# Patient Record
Sex: Female | Born: 1960
Health system: Southern US, Community
[De-identification: ages and names within clinical notes are randomized; demographics above are authoritative.]

## PROBLEM LIST (undated history)

## (undated) ENCOUNTER — Emergency Department (HOSPITAL_COMMUNITY): Admission: EM | Discharge status: Against Medical Advice | Payer: Self-pay

## (undated) DIAGNOSIS — N390 Urinary tract infection, site not specified: Secondary | ICD-10-CM

## (undated) DIAGNOSIS — F32A Depression, unspecified: Secondary | ICD-10-CM

## (undated) DIAGNOSIS — I10 Essential (primary) hypertension: Secondary | ICD-10-CM

## (undated) DIAGNOSIS — B20 Human immunodeficiency virus [HIV] disease: Secondary | ICD-10-CM

## (undated) DIAGNOSIS — F329 Major depressive disorder, single episode, unspecified: Secondary | ICD-10-CM

## (undated) DIAGNOSIS — Z21 Asymptomatic human immunodeficiency virus [HIV] infection status: Secondary | ICD-10-CM

## (undated) DIAGNOSIS — A150 Tuberculosis of lung: Secondary | ICD-10-CM

## (undated) DIAGNOSIS — F319 Bipolar disorder, unspecified: Secondary | ICD-10-CM

## (undated) DIAGNOSIS — K219 Gastro-esophageal reflux disease without esophagitis: Secondary | ICD-10-CM

## (undated) HISTORY — PX: ABDOMINAL HYSTERECTOMY: SHX81

---

## 1986-11-22 ENCOUNTER — Encounter (INDEPENDENT_AMBULATORY_CARE_PROVIDER_SITE_OTHER): Payer: Self-pay | Admitting: *Deleted

## 1986-11-22 LAB — CONVERTED CEMR LAB
CD4 Count: 1200 microliters
CD4 T Cell Abs: 1200

## 1987-01-22 DIAGNOSIS — A150 Tuberculosis of lung: Secondary | ICD-10-CM

## 1987-01-22 HISTORY — DX: Tuberculosis of lung: A15.0

## 1995-01-22 DIAGNOSIS — N390 Urinary tract infection, site not specified: Secondary | ICD-10-CM

## 1995-01-22 HISTORY — DX: Urinary tract infection, site not specified: N39.0

## 2002-07-27 ENCOUNTER — Encounter: Payer: Self-pay | Admitting: Internal Medicine

## 2002-07-27 ENCOUNTER — Ambulatory Visit (HOSPITAL_COMMUNITY): Admission: RE | Admit: 2002-07-27 | Discharge: 2002-07-27 | Payer: Self-pay | Admitting: Internal Medicine

## 2002-07-27 ENCOUNTER — Encounter: Admission: RE | Admit: 2002-07-27 | Discharge: 2002-07-27 | Payer: Self-pay | Admitting: Internal Medicine

## 2002-08-17 ENCOUNTER — Encounter: Payer: Self-pay | Admitting: Internal Medicine

## 2002-08-17 ENCOUNTER — Encounter: Admission: RE | Admit: 2002-08-17 | Discharge: 2002-08-17 | Payer: Self-pay | Admitting: Internal Medicine

## 2002-08-17 ENCOUNTER — Ambulatory Visit (HOSPITAL_COMMUNITY): Admission: RE | Admit: 2002-08-17 | Discharge: 2002-08-17 | Payer: Self-pay | Admitting: Internal Medicine

## 2002-10-09 ENCOUNTER — Emergency Department (HOSPITAL_COMMUNITY): Admission: EM | Admit: 2002-10-09 | Discharge: 2002-10-09 | Payer: Self-pay | Admitting: Emergency Medicine

## 2002-11-02 ENCOUNTER — Ambulatory Visit (HOSPITAL_COMMUNITY): Admission: RE | Admit: 2002-11-02 | Discharge: 2002-11-02 | Payer: Self-pay | Admitting: Internal Medicine

## 2002-11-02 ENCOUNTER — Encounter: Payer: Self-pay | Admitting: Internal Medicine

## 2002-11-02 ENCOUNTER — Encounter: Admission: RE | Admit: 2002-11-02 | Discharge: 2002-11-02 | Payer: Self-pay | Admitting: Internal Medicine

## 2002-11-16 ENCOUNTER — Encounter: Admission: RE | Admit: 2002-11-16 | Discharge: 2002-11-16 | Payer: Self-pay | Admitting: Internal Medicine

## 2003-02-01 ENCOUNTER — Encounter: Admission: RE | Admit: 2003-02-01 | Discharge: 2003-02-01 | Payer: Self-pay | Admitting: Internal Medicine

## 2003-02-15 ENCOUNTER — Encounter: Admission: RE | Admit: 2003-02-15 | Discharge: 2003-02-15 | Payer: Self-pay | Admitting: Internal Medicine

## 2003-05-11 ENCOUNTER — Encounter: Admission: RE | Admit: 2003-05-11 | Discharge: 2003-05-11 | Payer: Self-pay | Admitting: Internal Medicine

## 2003-05-11 ENCOUNTER — Ambulatory Visit (HOSPITAL_COMMUNITY): Admission: RE | Admit: 2003-05-11 | Discharge: 2003-05-11 | Payer: Self-pay | Admitting: Internal Medicine

## 2003-05-17 ENCOUNTER — Encounter: Admission: RE | Admit: 2003-05-17 | Discharge: 2003-05-17 | Payer: Self-pay | Admitting: Internal Medicine

## 2003-05-19 ENCOUNTER — Encounter: Admission: RE | Admit: 2003-05-19 | Discharge: 2003-05-19 | Payer: Self-pay | Admitting: Obstetrics and Gynecology

## 2003-05-24 ENCOUNTER — Encounter: Admission: RE | Admit: 2003-05-24 | Discharge: 2003-05-24 | Payer: Self-pay | Admitting: Internal Medicine

## 2003-05-31 ENCOUNTER — Encounter: Admission: RE | Admit: 2003-05-31 | Discharge: 2003-05-31 | Payer: Self-pay | Admitting: Internal Medicine

## 2003-08-08 ENCOUNTER — Encounter: Admission: RE | Admit: 2003-08-08 | Discharge: 2003-08-08 | Payer: Self-pay | Admitting: Obstetrics and Gynecology

## 2003-10-20 ENCOUNTER — Emergency Department (HOSPITAL_COMMUNITY): Admission: EM | Admit: 2003-10-20 | Discharge: 2003-10-20 | Payer: Self-pay | Admitting: Emergency Medicine

## 2003-10-24 ENCOUNTER — Ambulatory Visit: Payer: Self-pay | Admitting: Obstetrics and Gynecology

## 2003-10-24 ENCOUNTER — Emergency Department (HOSPITAL_COMMUNITY): Admission: EM | Admit: 2003-10-24 | Discharge: 2003-10-24 | Payer: Self-pay | Admitting: Family Medicine

## 2003-10-25 ENCOUNTER — Ambulatory Visit: Payer: Self-pay | Admitting: Obstetrics and Gynecology

## 2003-10-27 ENCOUNTER — Ambulatory Visit (HOSPITAL_COMMUNITY): Admission: RE | Admit: 2003-10-27 | Discharge: 2003-10-27 | Payer: Self-pay | Admitting: Infectious Diseases

## 2003-10-27 ENCOUNTER — Ambulatory Visit: Payer: Self-pay | Admitting: Infectious Diseases

## 2003-10-28 ENCOUNTER — Ambulatory Visit (HOSPITAL_COMMUNITY): Admission: RE | Admit: 2003-10-28 | Discharge: 2003-10-28 | Payer: Self-pay | Admitting: Infectious Diseases

## 2003-11-10 ENCOUNTER — Ambulatory Visit: Payer: Self-pay | Admitting: Infectious Diseases

## 2003-11-16 ENCOUNTER — Ambulatory Visit (HOSPITAL_COMMUNITY): Admission: RE | Admit: 2003-11-16 | Discharge: 2003-11-16 | Payer: Self-pay | Admitting: Obstetrics and Gynecology

## 2003-11-16 ENCOUNTER — Ambulatory Visit: Payer: Self-pay | Admitting: Obstetrics and Gynecology

## 2003-11-16 ENCOUNTER — Encounter (INDEPENDENT_AMBULATORY_CARE_PROVIDER_SITE_OTHER): Payer: Self-pay | Admitting: Specialist

## 2003-11-22 ENCOUNTER — Ambulatory Visit: Payer: Self-pay | Admitting: Internal Medicine

## 2004-01-10 ENCOUNTER — Ambulatory Visit: Payer: Self-pay | Admitting: Obstetrics and Gynecology

## 2004-01-25 ENCOUNTER — Ambulatory Visit: Payer: Self-pay | Admitting: Internal Medicine

## 2004-01-25 ENCOUNTER — Ambulatory Visit (HOSPITAL_COMMUNITY): Admission: RE | Admit: 2004-01-25 | Discharge: 2004-01-25 | Payer: Self-pay | Admitting: Internal Medicine

## 2004-02-13 ENCOUNTER — Ambulatory Visit (HOSPITAL_COMMUNITY): Admission: RE | Admit: 2004-02-13 | Discharge: 2004-02-13 | Payer: Self-pay | Admitting: Obstetrics and Gynecology

## 2004-02-13 ENCOUNTER — Ambulatory Visit: Payer: Self-pay | Admitting: Obstetrics and Gynecology

## 2004-02-14 ENCOUNTER — Ambulatory Visit: Payer: Self-pay | Admitting: Internal Medicine

## 2004-02-28 ENCOUNTER — Ambulatory Visit: Payer: Self-pay | Admitting: Obstetrics and Gynecology

## 2004-04-24 ENCOUNTER — Ambulatory Visit: Payer: Self-pay | Admitting: Internal Medicine

## 2004-04-24 ENCOUNTER — Ambulatory Visit (HOSPITAL_COMMUNITY): Admission: RE | Admit: 2004-04-24 | Discharge: 2004-04-24 | Payer: Self-pay | Admitting: Internal Medicine

## 2004-05-08 ENCOUNTER — Ambulatory Visit: Payer: Self-pay | Admitting: Internal Medicine

## 2004-05-09 ENCOUNTER — Ambulatory Visit (HOSPITAL_COMMUNITY): Admission: RE | Admit: 2004-05-09 | Discharge: 2004-05-09 | Payer: Self-pay | Admitting: Internal Medicine

## 2004-05-22 ENCOUNTER — Ambulatory Visit: Payer: Self-pay | Admitting: Obstetrics and Gynecology

## 2004-06-03 ENCOUNTER — Emergency Department (HOSPITAL_COMMUNITY): Admission: EM | Admit: 2004-06-03 | Discharge: 2004-06-03 | Payer: Self-pay | Admitting: Emergency Medicine

## 2004-06-27 ENCOUNTER — Ambulatory Visit (HOSPITAL_COMMUNITY): Admission: RE | Admit: 2004-06-27 | Discharge: 2004-06-27 | Payer: Self-pay | Admitting: Internal Medicine

## 2004-06-27 ENCOUNTER — Ambulatory Visit: Payer: Self-pay | Admitting: Internal Medicine

## 2004-07-10 ENCOUNTER — Ambulatory Visit: Payer: Self-pay | Admitting: Internal Medicine

## 2004-08-15 ENCOUNTER — Ambulatory Visit: Payer: Self-pay | Admitting: Internal Medicine

## 2004-08-24 ENCOUNTER — Emergency Department (HOSPITAL_COMMUNITY): Admission: EM | Admit: 2004-08-24 | Discharge: 2004-08-24 | Payer: Self-pay | Admitting: Emergency Medicine

## 2004-08-29 ENCOUNTER — Ambulatory Visit: Payer: Self-pay | Admitting: Internal Medicine

## 2004-09-29 ENCOUNTER — Inpatient Hospital Stay (HOSPITAL_COMMUNITY): Admission: AD | Admit: 2004-09-29 | Discharge: 2004-09-29 | Payer: Self-pay | Admitting: *Deleted

## 2004-10-02 ENCOUNTER — Ambulatory Visit: Payer: Self-pay | Admitting: Obstetrics & Gynecology

## 2004-10-03 ENCOUNTER — Encounter: Payer: Self-pay | Admitting: Emergency Medicine

## 2004-10-03 ENCOUNTER — Inpatient Hospital Stay (HOSPITAL_COMMUNITY): Admission: AD | Admit: 2004-10-03 | Discharge: 2004-10-04 | Payer: Self-pay | Admitting: Family Medicine

## 2004-10-03 ENCOUNTER — Ambulatory Visit: Payer: Self-pay | Admitting: Family Medicine

## 2004-10-04 ENCOUNTER — Ambulatory Visit (HOSPITAL_COMMUNITY): Admission: RE | Admit: 2004-10-04 | Discharge: 2004-10-04 | Payer: Self-pay | Admitting: Family Medicine

## 2004-10-04 ENCOUNTER — Ambulatory Visit: Payer: Self-pay | Admitting: Family Medicine

## 2004-10-08 ENCOUNTER — Ambulatory Visit (HOSPITAL_COMMUNITY): Admission: RE | Admit: 2004-10-08 | Discharge: 2004-10-08 | Payer: Self-pay | Admitting: Internal Medicine

## 2004-10-18 ENCOUNTER — Ambulatory Visit: Payer: Self-pay | Admitting: Obstetrics and Gynecology

## 2004-11-16 ENCOUNTER — Ambulatory Visit (HOSPITAL_COMMUNITY): Admission: RE | Admit: 2004-11-16 | Discharge: 2004-11-16 | Payer: Self-pay | Admitting: Internal Medicine

## 2004-11-16 ENCOUNTER — Ambulatory Visit: Payer: Self-pay | Admitting: Internal Medicine

## 2004-11-16 ENCOUNTER — Encounter (INDEPENDENT_AMBULATORY_CARE_PROVIDER_SITE_OTHER): Payer: Self-pay | Admitting: *Deleted

## 2004-11-16 LAB — CONVERTED CEMR LAB
CD4 Count: 550 microliters
HIV 1 RNA Quant: 399 copies/mL

## 2004-11-27 ENCOUNTER — Ambulatory Visit: Payer: Self-pay | Admitting: Internal Medicine

## 2005-04-30 ENCOUNTER — Encounter: Admission: RE | Admit: 2005-04-30 | Discharge: 2005-04-30 | Payer: Self-pay | Admitting: Internal Medicine

## 2005-04-30 ENCOUNTER — Ambulatory Visit: Payer: Self-pay | Admitting: Internal Medicine

## 2005-04-30 ENCOUNTER — Encounter (INDEPENDENT_AMBULATORY_CARE_PROVIDER_SITE_OTHER): Payer: Self-pay | Admitting: *Deleted

## 2005-04-30 LAB — CONVERTED CEMR LAB
CD4 Count: 490 microliters
HIV 1 RNA Quant: 399 copies/mL

## 2005-05-14 ENCOUNTER — Ambulatory Visit: Payer: Self-pay | Admitting: Internal Medicine

## 2005-06-04 ENCOUNTER — Encounter: Admission: RE | Admit: 2005-06-04 | Discharge: 2005-09-02 | Payer: Self-pay | Admitting: Internal Medicine

## 2005-06-20 ENCOUNTER — Ambulatory Visit: Payer: Self-pay | Admitting: *Deleted

## 2005-06-20 ENCOUNTER — Encounter (INDEPENDENT_AMBULATORY_CARE_PROVIDER_SITE_OTHER): Payer: Self-pay | Admitting: *Deleted

## 2005-06-28 ENCOUNTER — Emergency Department (HOSPITAL_COMMUNITY): Admission: EM | Admit: 2005-06-28 | Discharge: 2005-06-28 | Payer: Self-pay | Admitting: Emergency Medicine

## 2005-10-14 ENCOUNTER — Encounter: Admission: RE | Admit: 2005-10-14 | Discharge: 2005-10-14 | Payer: Self-pay | Admitting: Internal Medicine

## 2005-10-14 ENCOUNTER — Encounter (INDEPENDENT_AMBULATORY_CARE_PROVIDER_SITE_OTHER): Payer: Self-pay | Admitting: *Deleted

## 2005-10-14 ENCOUNTER — Ambulatory Visit: Payer: Self-pay | Admitting: Internal Medicine

## 2005-10-14 LAB — CONVERTED CEMR LAB
CD4 Count: 670 microliters
HIV 1 RNA Quant: 87 copies/mL

## 2005-10-29 ENCOUNTER — Ambulatory Visit: Payer: Self-pay | Admitting: Internal Medicine

## 2005-10-29 DIAGNOSIS — G4700 Insomnia, unspecified: Secondary | ICD-10-CM | POA: Insufficient documentation

## 2005-10-29 DIAGNOSIS — N8110 Cystocele, unspecified: Secondary | ICD-10-CM | POA: Insufficient documentation

## 2005-10-29 DIAGNOSIS — A528 Late syphilis, latent: Secondary | ICD-10-CM | POA: Insufficient documentation

## 2005-10-29 DIAGNOSIS — I10 Essential (primary) hypertension: Secondary | ICD-10-CM | POA: Insufficient documentation

## 2005-10-29 DIAGNOSIS — N920 Excessive and frequent menstruation with regular cycle: Secondary | ICD-10-CM | POA: Insufficient documentation

## 2005-10-29 DIAGNOSIS — E785 Hyperlipidemia, unspecified: Secondary | ICD-10-CM | POA: Insufficient documentation

## 2005-10-29 DIAGNOSIS — F17200 Nicotine dependence, unspecified, uncomplicated: Secondary | ICD-10-CM | POA: Insufficient documentation

## 2005-10-29 DIAGNOSIS — B20 Human immunodeficiency virus [HIV] disease: Secondary | ICD-10-CM | POA: Insufficient documentation

## 2005-11-21 ENCOUNTER — Ambulatory Visit: Payer: Self-pay | Admitting: Gynecology

## 2005-11-28 ENCOUNTER — Ambulatory Visit: Payer: Self-pay | Admitting: Obstetrics and Gynecology

## 2005-12-10 ENCOUNTER — Encounter: Payer: Self-pay | Admitting: Internal Medicine

## 2006-02-10 ENCOUNTER — Ambulatory Visit: Payer: Self-pay | Admitting: Obstetrics & Gynecology

## 2006-03-12 ENCOUNTER — Ambulatory Visit: Payer: Self-pay | Admitting: Obstetrics and Gynecology

## 2006-03-12 ENCOUNTER — Other Ambulatory Visit: Admission: RE | Admit: 2006-03-12 | Discharge: 2006-03-12 | Payer: Self-pay | Admitting: Obstetrics and Gynecology

## 2006-03-17 ENCOUNTER — Encounter (INDEPENDENT_AMBULATORY_CARE_PROVIDER_SITE_OTHER): Payer: Self-pay | Admitting: *Deleted

## 2006-03-17 LAB — CONVERTED CEMR LAB: Pap Smear: NORMAL

## 2006-03-25 ENCOUNTER — Encounter (INDEPENDENT_AMBULATORY_CARE_PROVIDER_SITE_OTHER): Payer: Self-pay | Admitting: Specialist

## 2006-03-25 ENCOUNTER — Inpatient Hospital Stay (HOSPITAL_COMMUNITY): Admission: RE | Admit: 2006-03-25 | Discharge: 2006-03-26 | Payer: Self-pay | Admitting: Obstetrics & Gynecology

## 2006-03-25 ENCOUNTER — Ambulatory Visit: Payer: Self-pay | Admitting: Obstetrics & Gynecology

## 2006-03-25 ENCOUNTER — Encounter (INDEPENDENT_AMBULATORY_CARE_PROVIDER_SITE_OTHER): Payer: Self-pay | Admitting: *Deleted

## 2006-03-25 LAB — CONVERTED CEMR LAB

## 2006-03-30 ENCOUNTER — Encounter (INDEPENDENT_AMBULATORY_CARE_PROVIDER_SITE_OTHER): Payer: Self-pay | Admitting: *Deleted

## 2006-04-24 ENCOUNTER — Telehealth: Payer: Self-pay | Admitting: Internal Medicine

## 2006-04-29 ENCOUNTER — Ambulatory Visit: Payer: Self-pay | Admitting: Internal Medicine

## 2006-04-29 ENCOUNTER — Encounter: Admission: RE | Admit: 2006-04-29 | Discharge: 2006-04-29 | Payer: Self-pay | Admitting: Internal Medicine

## 2006-05-02 ENCOUNTER — Ambulatory Visit: Payer: Self-pay | Admitting: Family Medicine

## 2006-05-06 DIAGNOSIS — F32A Depression, unspecified: Secondary | ICD-10-CM | POA: Insufficient documentation

## 2006-05-06 DIAGNOSIS — M545 Low back pain, unspecified: Secondary | ICD-10-CM | POA: Insufficient documentation

## 2006-05-06 DIAGNOSIS — F329 Major depressive disorder, single episode, unspecified: Secondary | ICD-10-CM

## 2006-05-13 ENCOUNTER — Ambulatory Visit: Payer: Self-pay | Admitting: Internal Medicine

## 2006-05-13 DIAGNOSIS — Z9079 Acquired absence of other genital organ(s): Secondary | ICD-10-CM | POA: Insufficient documentation

## 2006-05-22 ENCOUNTER — Telehealth: Payer: Self-pay | Admitting: Internal Medicine

## 2006-06-05 ENCOUNTER — Telehealth: Payer: Self-pay | Admitting: Internal Medicine

## 2006-06-17 ENCOUNTER — Telehealth: Payer: Self-pay | Admitting: Internal Medicine

## 2006-06-19 ENCOUNTER — Telehealth: Payer: Self-pay | Admitting: Internal Medicine

## 2006-07-17 ENCOUNTER — Telehealth: Payer: Self-pay | Admitting: Internal Medicine

## 2006-08-13 ENCOUNTER — Telehealth: Payer: Self-pay | Admitting: Internal Medicine

## 2006-09-12 ENCOUNTER — Telehealth: Payer: Self-pay | Admitting: Internal Medicine
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
## 2006-10-17 ENCOUNTER — Telehealth: Payer: Self-pay | Admitting: Internal Medicine

## 2006-11-17 ENCOUNTER — Telehealth: Payer: Self-pay | Admitting: Internal Medicine

## 2006-12-01 ENCOUNTER — Telehealth: Payer: Self-pay | Admitting: Internal Medicine

## 2006-12-03 ENCOUNTER — Ambulatory Visit: Payer: Self-pay | Admitting: Internal Medicine

## 2006-12-03 ENCOUNTER — Encounter: Admission: RE | Admit: 2006-12-03 | Discharge: 2006-12-03 | Payer: Self-pay | Admitting: Internal Medicine

## 2006-12-03 ENCOUNTER — Telehealth: Payer: Self-pay | Admitting: Internal Medicine

## 2006-12-03 LAB — CONVERTED CEMR LAB
HIV 1 RNA Quant: 163 copies/mL — ABNORMAL HIGH (ref <50)
HIV-1 RNA Quant, Log: 2.21 — ABNORMAL HIGH (ref <1.70)

## 2006-12-16 ENCOUNTER — Telehealth: Payer: Self-pay | Admitting: Internal Medicine

## 2006-12-16 ENCOUNTER — Ambulatory Visit: Payer: Self-pay | Admitting: Internal Medicine

## 2007-01-18 ENCOUNTER — Telehealth (INDEPENDENT_AMBULATORY_CARE_PROVIDER_SITE_OTHER): Payer: Self-pay | Admitting: Internal Medicine

## 2007-01-19 ENCOUNTER — Telehealth: Payer: Self-pay | Admitting: Internal Medicine

## 2007-01-20 ENCOUNTER — Encounter: Payer: Self-pay | Admitting: Internal Medicine

## 2007-01-21 ENCOUNTER — Encounter (INDEPENDENT_AMBULATORY_CARE_PROVIDER_SITE_OTHER): Payer: Self-pay | Admitting: *Deleted

## 2007-01-22 DIAGNOSIS — F319 Bipolar disorder, unspecified: Secondary | ICD-10-CM

## 2007-01-22 HISTORY — DX: Bipolar disorder, unspecified: F31.9

## 2007-01-27 ENCOUNTER — Telehealth: Payer: Self-pay | Admitting: Internal Medicine

## 2007-01-28 ENCOUNTER — Ambulatory Visit: Payer: Self-pay | Admitting: Internal Medicine

## 2007-01-29 ENCOUNTER — Telehealth: Payer: Self-pay | Admitting: Internal Medicine

## 2007-01-29 ENCOUNTER — Emergency Department (HOSPITAL_COMMUNITY): Admission: EM | Admit: 2007-01-29 | Discharge: 2007-01-29 | Payer: Self-pay | Admitting: Emergency Medicine

## 2007-02-04 ENCOUNTER — Encounter: Payer: Self-pay | Admitting: Internal Medicine

## 2007-02-04 ENCOUNTER — Telehealth: Payer: Self-pay | Admitting: Internal Medicine

## 2007-02-09 ENCOUNTER — Encounter: Admission: RE | Admit: 2007-02-09 | Discharge: 2007-02-09 | Payer: Self-pay | Admitting: Internal Medicine

## 2007-02-09 ENCOUNTER — Ambulatory Visit: Payer: Self-pay | Admitting: Internal Medicine

## 2007-02-11 ENCOUNTER — Telehealth: Payer: Self-pay | Admitting: Licensed Clinical Social Worker

## 2007-02-13 ENCOUNTER — Encounter: Payer: Self-pay | Admitting: Internal Medicine

## 2007-02-13 ENCOUNTER — Encounter: Payer: Self-pay | Admitting: Licensed Clinical Social Worker

## 2007-02-16 ENCOUNTER — Telehealth: Payer: Self-pay | Admitting: Internal Medicine

## 2007-02-23 ENCOUNTER — Telehealth: Payer: Self-pay | Admitting: Internal Medicine

## 2007-02-23 ENCOUNTER — Encounter: Payer: Self-pay | Admitting: Internal Medicine

## 2007-02-23 ENCOUNTER — Encounter: Admission: RE | Admit: 2007-02-23 | Discharge: 2007-03-17 | Payer: Self-pay | Admitting: Internal Medicine

## 2007-02-25 ENCOUNTER — Telehealth: Payer: Self-pay | Admitting: Internal Medicine

## 2007-03-12 ENCOUNTER — Telehealth: Payer: Self-pay | Admitting: Internal Medicine

## 2007-03-16 ENCOUNTER — Telehealth: Payer: Self-pay | Admitting: Internal Medicine

## 2007-03-16 ENCOUNTER — Encounter: Payer: Self-pay | Admitting: Internal Medicine

## 2007-03-18 ENCOUNTER — Telehealth: Payer: Self-pay | Admitting: Internal Medicine

## 2007-03-19 ENCOUNTER — Encounter (INDEPENDENT_AMBULATORY_CARE_PROVIDER_SITE_OTHER): Payer: Self-pay | Admitting: *Deleted

## 2007-03-24 ENCOUNTER — Ambulatory Visit: Payer: Self-pay | Admitting: Internal Medicine

## 2007-03-25 ENCOUNTER — Encounter: Payer: Self-pay | Admitting: Internal Medicine

## 2007-03-27 ENCOUNTER — Telehealth: Payer: Self-pay | Admitting: Internal Medicine

## 2007-03-31 ENCOUNTER — Encounter: Payer: Self-pay | Admitting: Internal Medicine

## 2007-04-15 ENCOUNTER — Telehealth: Payer: Self-pay | Admitting: Internal Medicine

## 2007-04-15 ENCOUNTER — Encounter (INDEPENDENT_AMBULATORY_CARE_PROVIDER_SITE_OTHER): Payer: Self-pay | Admitting: *Deleted

## 2007-04-20 ENCOUNTER — Telehealth: Payer: Self-pay | Admitting: Internal Medicine

## 2007-04-20 ENCOUNTER — Encounter: Payer: Self-pay | Admitting: Internal Medicine

## 2007-05-04 ENCOUNTER — Encounter: Payer: Self-pay | Admitting: Internal Medicine

## 2007-05-06 ENCOUNTER — Telehealth: Payer: Self-pay

## 2007-05-06 ENCOUNTER — Telehealth: Payer: Self-pay | Admitting: Internal Medicine

## 2007-05-07 ENCOUNTER — Emergency Department (HOSPITAL_COMMUNITY): Admission: EM | Admit: 2007-05-07 | Discharge: 2007-05-07 | Payer: Self-pay | Admitting: Family Medicine

## 2007-05-07 ENCOUNTER — Telehealth: Payer: Self-pay

## 2007-05-18 ENCOUNTER — Telehealth (INDEPENDENT_AMBULATORY_CARE_PROVIDER_SITE_OTHER): Payer: Self-pay | Admitting: *Deleted

## 2007-06-17 ENCOUNTER — Telehealth (INDEPENDENT_AMBULATORY_CARE_PROVIDER_SITE_OTHER): Payer: Self-pay | Admitting: *Deleted

## 2007-06-18 ENCOUNTER — Ambulatory Visit: Payer: Self-pay | Admitting: Internal Medicine

## 2007-06-18 ENCOUNTER — Encounter: Admission: RE | Admit: 2007-06-18 | Discharge: 2007-06-18 | Payer: Self-pay | Admitting: Internal Medicine

## 2007-06-18 LAB — CONVERTED CEMR LAB
ALT: 11 units/L (ref 0–35)
AST: 35 units/L (ref 0–37)
Albumin: 4.5 g/dL (ref 3.5–5.2)
Alkaline Phosphatase: 107 units/L (ref 39–117)
BUN: 8 mg/dL (ref 6–23)
Basophils Absolute: 0 10*3/uL (ref 0.0–0.1)
Basophils Relative: 1 % (ref 0–1)
CO2: 25 meq/L (ref 19–32)
Calcium: 9.8 mg/dL (ref 8.4–10.5)
Chloride: 101 meq/L (ref 96–112)
Cholesterol: 178 mg/dL (ref 0–200)
Creatinine, Ser: 0.64 mg/dL (ref 0.40–1.20)
Eosinophils Absolute: 0.1 10*3/uL (ref 0.0–0.7)
Eosinophils Relative: 1 % (ref 0–5)
Glucose, Bld: 96 mg/dL (ref 70–99)
HCT: 42.7 % (ref 36.0–46.0)
HCV Quantitative: 2700000 intl units/mL — ABNORMAL HIGH (ref <43)
HDL: 44 mg/dL (ref >39)
HIV 1 RNA Quant: 229 copies/mL — ABNORMAL HIGH (ref <50)
HIV-1 RNA Quant, Log: 2.36 — ABNORMAL HIGH (ref <1.70)
Hemoglobin: 14.8 g/dL (ref 12.0–15.0)
LDL Cholesterol: 97 mg/dL (ref 0–99)
Lymphocytes Relative: 35 % (ref 12–46)
Lymphs Abs: 2.6 10*3/uL (ref 0.7–4.0)
MCHC: 34.7 g/dL (ref 30.0–36.0)
MCV: 98.6 fL (ref 78.0–100.0)
Monocytes Absolute: 0.6 10*3/uL (ref 0.1–1.0)
Monocytes Relative: 8 % (ref 3–12)
Neutro Abs: 4.2 10*3/uL (ref 1.7–7.7)
Neutrophils Relative %: 56 % (ref 43–77)
Platelets: 191 10*3/uL (ref 150–400)
Potassium: 3.1 meq/L — ABNORMAL LOW (ref 3.5–5.3)
RBC: 4.33 M/uL (ref 3.87–5.11)
RDW: 13.1 % (ref 11.5–15.5)
Sodium: 139 meq/L (ref 135–145)
Total Bilirubin: 0.8 mg/dL (ref 0.3–1.2)
Total CHOL/HDL Ratio: 4
Total Protein: 8 g/dL (ref 6.0–8.3)
Triglycerides: 187 mg/dL — ABNORMAL HIGH (ref <150)
VLDL: 37 mg/dL (ref 0–40)
WBC: 7.5 10*3/uL (ref 4.0–10.5)

## 2007-07-02 ENCOUNTER — Telehealth (INDEPENDENT_AMBULATORY_CARE_PROVIDER_SITE_OTHER): Payer: Self-pay | Admitting: *Deleted

## 2007-07-13 ENCOUNTER — Telehealth (INDEPENDENT_AMBULATORY_CARE_PROVIDER_SITE_OTHER): Payer: Self-pay | Admitting: *Deleted

## 2007-07-27 ENCOUNTER — Encounter: Payer: Self-pay | Admitting: Internal Medicine

## 2007-08-25 ENCOUNTER — Ambulatory Visit: Payer: Self-pay | Admitting: Internal Medicine

## 2007-08-25 ENCOUNTER — Encounter: Admission: RE | Admit: 2007-08-25 | Discharge: 2007-08-25 | Payer: Self-pay | Admitting: Internal Medicine

## 2007-08-25 LAB — CONVERTED CEMR LAB
ALT: 10 units/L (ref 0–35)
AST: 29 units/L (ref 0–37)
Albumin: 4.2 g/dL (ref 3.5–5.2)
Alkaline Phosphatase: 90 units/L (ref 39–117)
BUN: 11 mg/dL (ref 6–23)
CO2: 24 meq/L (ref 19–32)
Calcium: 9.5 mg/dL (ref 8.4–10.5)
Chloride: 103 meq/L (ref 96–112)
Cholesterol: 159 mg/dL (ref 0–200)
Creatinine, Ser: 0.74 mg/dL (ref 0.40–1.20)
Glucose, Bld: 93 mg/dL (ref 70–99)
HCT: 40.2 % (ref 36.0–46.0)
HDL: 33 mg/dL — ABNORMAL LOW (ref >39)
HIV 1 RNA Quant: 279 copies/mL — ABNORMAL HIGH (ref <50)
HIV-1 RNA Quant, Log: 2.45 — ABNORMAL HIGH (ref <1.70)
Hemoglobin: 14.3 g/dL (ref 12.0–15.0)
LDL Cholesterol: 72 mg/dL (ref 0–99)
MCHC: 35.6 g/dL (ref 30.0–36.0)
MCV: 97.3 fL (ref 78.0–100.0)
Platelets: 183 10*3/uL (ref 150–400)
Potassium: 3.4 meq/L — ABNORMAL LOW (ref 3.5–5.3)
RBC: 4.13 M/uL (ref 3.87–5.11)
RDW: 12.9 % (ref 11.5–15.5)
Sodium: 138 meq/L (ref 135–145)
Total Bilirubin: 0.5 mg/dL (ref 0.3–1.2)
Total CHOL/HDL Ratio: 4.8
Total Protein: 7.6 g/dL (ref 6.0–8.3)
Triglycerides: 270 mg/dL — ABNORMAL HIGH (ref <150)
VLDL: 54 mg/dL — ABNORMAL HIGH (ref 0–40)
WBC: 7.1 10*3/uL (ref 4.0–10.5)

## 2007-09-08 ENCOUNTER — Ambulatory Visit: Payer: Self-pay | Admitting: Internal Medicine

## 2007-09-08 ENCOUNTER — Encounter: Payer: Self-pay | Admitting: Licensed Clinical Social Worker

## 2007-09-08 DIAGNOSIS — B182 Chronic viral hepatitis C: Secondary | ICD-10-CM | POA: Insufficient documentation

## 2007-09-15 ENCOUNTER — Telehealth (INDEPENDENT_AMBULATORY_CARE_PROVIDER_SITE_OTHER): Payer: Self-pay | Admitting: *Deleted

## 2007-10-05 ENCOUNTER — Telehealth (INDEPENDENT_AMBULATORY_CARE_PROVIDER_SITE_OTHER): Payer: Self-pay | Admitting: *Deleted

## 2007-10-12 ENCOUNTER — Telehealth (INDEPENDENT_AMBULATORY_CARE_PROVIDER_SITE_OTHER): Payer: Self-pay | Admitting: *Deleted

## 2007-10-26 ENCOUNTER — Ambulatory Visit: Payer: Self-pay | Admitting: Internal Medicine

## 2007-10-28 ENCOUNTER — Telehealth (INDEPENDENT_AMBULATORY_CARE_PROVIDER_SITE_OTHER): Payer: Self-pay | Admitting: *Deleted

## 2007-11-12 ENCOUNTER — Telehealth (INDEPENDENT_AMBULATORY_CARE_PROVIDER_SITE_OTHER): Payer: Self-pay | Admitting: *Deleted

## 2007-12-14 ENCOUNTER — Telehealth (INDEPENDENT_AMBULATORY_CARE_PROVIDER_SITE_OTHER): Payer: Self-pay | Admitting: *Deleted

## 2008-01-11 ENCOUNTER — Telehealth (INDEPENDENT_AMBULATORY_CARE_PROVIDER_SITE_OTHER): Payer: Self-pay | Admitting: *Deleted

## 2008-01-13 ENCOUNTER — Telehealth (INDEPENDENT_AMBULATORY_CARE_PROVIDER_SITE_OTHER): Payer: Self-pay | Admitting: *Deleted

## 2008-01-30 ENCOUNTER — Inpatient Hospital Stay (HOSPITAL_COMMUNITY): Admission: AD | Admit: 2008-01-30 | Discharge: 2008-01-30 | Payer: Self-pay | Admitting: Obstetrics & Gynecology

## 2008-02-11 ENCOUNTER — Telehealth (INDEPENDENT_AMBULATORY_CARE_PROVIDER_SITE_OTHER): Payer: Self-pay | Admitting: *Deleted

## 2008-03-10 ENCOUNTER — Telehealth (INDEPENDENT_AMBULATORY_CARE_PROVIDER_SITE_OTHER): Payer: Self-pay | Admitting: *Deleted

## 2008-04-01 ENCOUNTER — Emergency Department (HOSPITAL_COMMUNITY): Admission: EM | Admit: 2008-04-01 | Discharge: 2008-04-01 | Payer: Self-pay | Admitting: Emergency Medicine

## 2008-04-12 ENCOUNTER — Telehealth (INDEPENDENT_AMBULATORY_CARE_PROVIDER_SITE_OTHER): Payer: Self-pay | Admitting: *Deleted

## 2008-04-19 ENCOUNTER — Ambulatory Visit: Payer: Self-pay | Admitting: Internal Medicine

## 2008-04-19 LAB — CONVERTED CEMR LAB
ALT: 13 units/L (ref 0–35)
AST: 41 units/L — ABNORMAL HIGH (ref 0–37)
Albumin: 4.3 g/dL (ref 3.5–5.2)
Alkaline Phosphatase: 88 units/L (ref 39–117)
BUN: 12 mg/dL (ref 6–23)
Basophils Absolute: 0 10*3/uL (ref 0.0–0.1)
Basophils Relative: 1 % (ref 0–1)
CO2: 23 meq/L (ref 19–32)
Calcium: 9.3 mg/dL (ref 8.4–10.5)
Chloride: 103 meq/L (ref 96–112)
Cholesterol: 158 mg/dL (ref 0–200)
Creatinine, Ser: 0.71 mg/dL (ref 0.40–1.20)
Eosinophils Absolute: 0.1 10*3/uL (ref 0.0–0.7)
Eosinophils Relative: 1 % (ref 0–5)
GFR calc Af Amer: >60 mL/min (ref >60)
GFR calc non Af Amer: >60 mL/min (ref >60)
Glucose, Bld: 81 mg/dL (ref 70–99)
HCT: 38.4 % (ref 36.0–46.0)
HDL: 40 mg/dL (ref >39)
HIV 1 RNA Quant: 219 copies/mL — ABNORMAL HIGH (ref <48)
HIV-1 RNA Quant, Log: 2.34 — ABNORMAL HIGH (ref <1.68)
Hemoglobin: 13.9 g/dL (ref 12.0–15.0)
LDL Cholesterol: 85 mg/dL (ref 0–99)
Lymphocytes Relative: 51 % — ABNORMAL HIGH (ref 12–46)
Lymphs Abs: 3.4 10*3/uL (ref 0.7–4.0)
MCHC: 36.2 g/dL — ABNORMAL HIGH (ref 30.0–36.0)
MCV: 94.3 fL (ref 78.0–100.0)
Monocytes Absolute: 0.5 10*3/uL (ref 0.1–1.0)
Monocytes Relative: 7 % (ref 3–12)
Neutro Abs: 2.7 10*3/uL (ref 1.7–7.7)
Neutrophils Relative %: 40 % — ABNORMAL LOW (ref 43–77)
Platelets: 164 10*3/uL (ref 150–400)
Potassium: 3.5 meq/L (ref 3.5–5.3)
RBC: 4.07 M/uL (ref 3.87–5.11)
RDW: 13.6 % (ref 11.5–15.5)
Sodium: 144 meq/L (ref 135–145)
Total Bilirubin: 0.6 mg/dL (ref 0.3–1.2)
Total CHOL/HDL Ratio: 4
Total Protein: 7.9 g/dL (ref 6.0–8.3)
Triglycerides: 165 mg/dL — ABNORMAL HIGH (ref <150)
VLDL: 33 mg/dL (ref 0–40)
WBC: 6.6 10*3/uL (ref 4.0–10.5)

## 2008-05-03 ENCOUNTER — Encounter (INDEPENDENT_AMBULATORY_CARE_PROVIDER_SITE_OTHER): Payer: Self-pay | Admitting: *Deleted

## 2008-05-06 ENCOUNTER — Telehealth (INDEPENDENT_AMBULATORY_CARE_PROVIDER_SITE_OTHER): Payer: Self-pay | Admitting: *Deleted

## 2008-05-09 ENCOUNTER — Ambulatory Visit: Payer: Self-pay | Admitting: Internal Medicine

## 2008-05-09 ENCOUNTER — Telehealth (INDEPENDENT_AMBULATORY_CARE_PROVIDER_SITE_OTHER): Payer: Self-pay | Admitting: *Deleted

## 2008-05-09 DIAGNOSIS — M654 Radial styloid tenosynovitis [de Quervain]: Secondary | ICD-10-CM | POA: Insufficient documentation

## 2008-05-09 DIAGNOSIS — K5909 Other constipation: Secondary | ICD-10-CM | POA: Insufficient documentation

## 2008-05-12 ENCOUNTER — Telehealth (INDEPENDENT_AMBULATORY_CARE_PROVIDER_SITE_OTHER): Payer: Self-pay | Admitting: *Deleted

## 2008-05-30 ENCOUNTER — Telehealth (INDEPENDENT_AMBULATORY_CARE_PROVIDER_SITE_OTHER): Payer: Self-pay | Admitting: *Deleted

## 2008-05-30 ENCOUNTER — Emergency Department (HOSPITAL_COMMUNITY): Admission: EM | Admit: 2008-05-30 | Discharge: 2008-05-30 | Payer: Self-pay | Admitting: Family Medicine

## 2008-05-31 ENCOUNTER — Telehealth (INDEPENDENT_AMBULATORY_CARE_PROVIDER_SITE_OTHER): Payer: Self-pay | Admitting: *Deleted

## 2008-06-21 ENCOUNTER — Ambulatory Visit: Payer: Self-pay | Admitting: Gastroenterology

## 2008-06-22 ENCOUNTER — Ambulatory Visit: Payer: Self-pay | Admitting: Gastroenterology

## 2008-06-27 ENCOUNTER — Telehealth (INDEPENDENT_AMBULATORY_CARE_PROVIDER_SITE_OTHER): Payer: Self-pay | Admitting: *Deleted

## 2008-07-28 ENCOUNTER — Telehealth (INDEPENDENT_AMBULATORY_CARE_PROVIDER_SITE_OTHER): Payer: Self-pay | Admitting: *Deleted

## 2008-08-25 ENCOUNTER — Telehealth (INDEPENDENT_AMBULATORY_CARE_PROVIDER_SITE_OTHER): Payer: Self-pay | Admitting: *Deleted

## 2008-09-29 ENCOUNTER — Telehealth (INDEPENDENT_AMBULATORY_CARE_PROVIDER_SITE_OTHER): Payer: Self-pay | Admitting: *Deleted

## 2008-10-12 ENCOUNTER — Ambulatory Visit: Payer: Self-pay | Admitting: Internal Medicine

## 2008-10-12 LAB — CONVERTED CEMR LAB
Basophils Absolute: 0 10*3/uL (ref 0.0–0.1)
Basophils Relative: 0 % (ref 0–1)
Eosinophils Absolute: 0.1 10*3/uL (ref 0.0–0.7)
Eosinophils Relative: 2 % (ref 0–5)
HCT: 38.6 % (ref 36.0–46.0)
HIV 1 RNA Quant: 399 copies/mL — ABNORMAL HIGH (ref <48)
HIV-1 RNA Quant, Log: 2.6 — ABNORMAL HIGH (ref <1.68)
Hemoglobin: 13.3 g/dL (ref 12.0–15.0)
Lymphocytes Relative: 48 % — ABNORMAL HIGH (ref 12–46)
Lymphs Abs: 3.4 10*3/uL (ref 0.7–4.0)
MCHC: 34.5 g/dL (ref 30.0–36.0)
MCV: 96.5 fL (ref >=78.0)
Monocytes Absolute: 0.6 10*3/uL (ref 0.1–1.0)
Monocytes Relative: 8 % (ref 3–12)
Neutro Abs: 3 10*3/uL (ref 1.7–7.7)
Neutrophils Relative %: 42 % — ABNORMAL LOW (ref 43–77)
Platelets: 171 10*3/uL (ref 150–400)
RBC: 4 M/uL (ref 3.87–5.11)
RDW: 13.2 % (ref 11.5–15.5)
WBC: 7.2 10*3/uL (ref 4.0–10.5)

## 2008-10-26 ENCOUNTER — Telehealth (INDEPENDENT_AMBULATORY_CARE_PROVIDER_SITE_OTHER): Payer: Self-pay | Admitting: *Deleted

## 2008-11-15 ENCOUNTER — Ambulatory Visit: Payer: Self-pay | Admitting: Internal Medicine

## 2008-11-15 ENCOUNTER — Encounter (INDEPENDENT_AMBULATORY_CARE_PROVIDER_SITE_OTHER): Payer: Self-pay | Admitting: *Deleted

## 2008-11-22 ENCOUNTER — Telehealth (INDEPENDENT_AMBULATORY_CARE_PROVIDER_SITE_OTHER): Payer: Self-pay | Admitting: *Deleted

## 2008-12-23 ENCOUNTER — Telehealth (INDEPENDENT_AMBULATORY_CARE_PROVIDER_SITE_OTHER): Payer: Self-pay | Admitting: *Deleted

## 2009-01-03 ENCOUNTER — Ambulatory Visit: Payer: Self-pay | Admitting: Infectious Disease

## 2009-01-03 DIAGNOSIS — J42 Unspecified chronic bronchitis: Secondary | ICD-10-CM | POA: Insufficient documentation

## 2009-01-03 LAB — CONVERTED CEMR LAB
ALT: 10 units/L (ref 0–35)
AST: 30 units/L (ref 0–37)
Albumin: 4.3 g/dL (ref 3.5–5.2)
Alkaline Phosphatase: 108 units/L (ref 39–117)
BUN: 6 mg/dL (ref 6–23)
Basophils Absolute: 0.1 10*3/uL (ref 0.0–0.1)
Basophils Relative: 1 % (ref 0–1)
CO2: 23 meq/L (ref 19–32)
Calcium: 9.5 mg/dL (ref 8.4–10.5)
Chloride: 102 meq/L (ref 96–112)
Creatinine, Ser: 0.64 mg/dL (ref 0.40–1.20)
Eosinophils Absolute: 0.2 10*3/uL (ref 0.0–0.7)
Eosinophils Relative: 2 % (ref 0–5)
Glucose, Bld: 94 mg/dL (ref 70–99)
HCT: 41 % (ref 36.0–46.0)
Hemoglobin: 14.1 g/dL (ref 12.0–15.0)
Lymphocytes Relative: 47 % — ABNORMAL HIGH (ref 12–46)
Lymphs Abs: 4.4 10*3/uL — ABNORMAL HIGH (ref 0.7–4.0)
MCHC: 34.4 g/dL (ref 30.0–36.0)
MCV: 98.8 fL (ref >=78.0)
Monocytes Absolute: 0.8 10*3/uL (ref 0.1–1.0)
Monocytes Relative: 9 % (ref 3–12)
Neutro Abs: 3.9 10*3/uL (ref 1.7–7.7)
Neutrophils Relative %: 42 % — ABNORMAL LOW (ref 43–77)
Platelets: 179 10*3/uL (ref 150–400)
Potassium: 3.6 meq/L (ref 3.5–5.3)
RBC: 4.15 M/uL (ref 3.87–5.11)
RDW: 13.4 % (ref 11.5–15.5)
Sodium: 138 meq/L (ref 135–145)
Total Bilirubin: 0.4 mg/dL (ref 0.3–1.2)
Total Protein: 8 g/dL (ref 6.0–8.3)
WBC: 9.4 10*3/uL (ref 4.0–10.5)

## 2009-01-04 ENCOUNTER — Telehealth (INDEPENDENT_AMBULATORY_CARE_PROVIDER_SITE_OTHER): Payer: Self-pay | Admitting: *Deleted

## 2009-01-19 ENCOUNTER — Telehealth (INDEPENDENT_AMBULATORY_CARE_PROVIDER_SITE_OTHER): Payer: Self-pay | Admitting: *Deleted

## 2009-02-15 ENCOUNTER — Telehealth (INDEPENDENT_AMBULATORY_CARE_PROVIDER_SITE_OTHER): Payer: Self-pay | Admitting: *Deleted

## 2009-02-21 ENCOUNTER — Emergency Department (HOSPITAL_COMMUNITY): Admission: EM | Admit: 2009-02-21 | Discharge: 2009-02-21 | Payer: Self-pay | Admitting: Family Medicine

## 2009-03-21 ENCOUNTER — Telehealth (INDEPENDENT_AMBULATORY_CARE_PROVIDER_SITE_OTHER): Payer: Self-pay | Admitting: *Deleted

## 2009-04-13 ENCOUNTER — Telehealth (INDEPENDENT_AMBULATORY_CARE_PROVIDER_SITE_OTHER): Payer: Self-pay | Admitting: *Deleted

## 2009-11-09 ENCOUNTER — Ambulatory Visit: Payer: Self-pay | Admitting: Internal Medicine

## 2009-11-09 LAB — CONVERTED CEMR LAB
Albumin: 3.8 g/dL (ref 3.5–5.2)
Alkaline Phosphatase: 87 units/L (ref 39–117)
BUN: 7 mg/dL (ref 6–23)
CO2: 25 meq/L (ref 19–32)
Calcium: 8.7 mg/dL (ref 8.4–10.5)
Chloride: 108 meq/L (ref 96–112)
Cholesterol: 137 mg/dL (ref 0–200)
Eosinophils Absolute: 0 10*3/uL (ref 0.0–0.7)
Glucose, Bld: 86 mg/dL (ref 70–99)
HDL: 24 mg/dL — ABNORMAL LOW (ref >39)
Lymphs Abs: 2.5 10*3/uL (ref 0.7–4.0)
MCV: 93.3 fL (ref 78.0–100.0)
Monocytes Relative: 11 % (ref 3–12)
Neutrophils Relative %: 34 % — ABNORMAL LOW (ref 43–77)
Potassium: 3.5 meq/L (ref 3.5–5.3)
RBC: 4.18 M/uL (ref 3.87–5.11)
Triglycerides: 388 mg/dL — ABNORMAL HIGH (ref <150)
WBC: 4.7 10*3/uL (ref 4.0–10.5)

## 2009-11-30 ENCOUNTER — Ambulatory Visit: Payer: Self-pay | Admitting: Internal Medicine

## 2009-11-30 DIAGNOSIS — F1410 Cocaine abuse, uncomplicated: Secondary | ICD-10-CM | POA: Insufficient documentation

## 2009-12-13 ENCOUNTER — Encounter (INDEPENDENT_AMBULATORY_CARE_PROVIDER_SITE_OTHER): Payer: Self-pay | Admitting: *Deleted

## 2009-12-18 ENCOUNTER — Encounter (INDEPENDENT_AMBULATORY_CARE_PROVIDER_SITE_OTHER): Payer: Self-pay | Admitting: *Deleted

## 2009-12-22 ENCOUNTER — Encounter (INDEPENDENT_AMBULATORY_CARE_PROVIDER_SITE_OTHER): Payer: Self-pay | Admitting: *Deleted

## 2010-01-04 ENCOUNTER — Ambulatory Visit: Payer: Self-pay | Admitting: Internal Medicine

## 2010-02-15 ENCOUNTER — Encounter: Payer: Self-pay | Admitting: Internal Medicine

## 2010-02-15 ENCOUNTER — Ambulatory Visit
Admission: RE | Admit: 2010-02-15 | Discharge: 2010-02-15 | Payer: Self-pay | Source: Home / Self Care | Attending: Internal Medicine | Admitting: Internal Medicine

## 2010-02-15 LAB — CONVERTED CEMR LAB
ALT: 11 units/L (ref 0–35)
Albumin: 3.9 g/dL (ref 3.5–5.2)
Basophils Relative: 1 % (ref 0–1)
CO2: 27 meq/L (ref 19–32)
Chloride: 109 meq/L (ref 96–112)
Cholesterol: 137 mg/dL (ref 0–200)
Glucose, Bld: 113 mg/dL — ABNORMAL HIGH (ref 70–99)
HDL: 32 mg/dL — ABNORMAL LOW (ref >39)
HIV-1 RNA Quant, Log: 2.13 — ABNORMAL HIGH (ref <1.30)
LDL Cholesterol: 55 mg/dL (ref 0–99)
Lymphs Abs: 3 10*3/uL (ref 0.7–4.0)
Monocytes Relative: 8 % (ref 3–12)
Neutro Abs: 2.1 10*3/uL (ref 1.7–7.7)
Neutrophils Relative %: 37 % — ABNORMAL LOW (ref 43–77)
Platelets: 148 10*3/uL — ABNORMAL LOW (ref 150–400)
Potassium: 3.6 meq/L (ref 3.5–5.3)
RBC: 4 M/uL (ref 3.87–5.11)
Sodium: 141 meq/L (ref 135–145)
Total Protein: 7.5 g/dL (ref 6.0–8.3)
Triglycerides: 251 mg/dL — ABNORMAL HIGH (ref <150)
WBC: 5.7 10*3/uL (ref 4.0–10.5)

## 2010-02-16 LAB — T-HELPER CELL (CD4) - (RCID CLINIC ONLY)
CD4 % Helper T Cell: 35 % (ref 33–55)
CD4 T Cell Abs: 1110 uL (ref 400–2700)

## 2010-02-18 LAB — CONVERTED CEMR LAB
ALT: 10 units/L (ref 0–35)
AST: 27 units/L (ref 0–37)
Albumin: 4.5 g/dL (ref 3.5–5.2)
Alkaline Phosphatase: 103 units/L (ref 39–117)
BUN: 8 mg/dL (ref 6–23)
Basophils Absolute: 0 10*3/uL (ref 0.0–0.1)
Basophils Relative: 1 % (ref 0–1)
CD4 Count: 830 microliters
CO2: 22 meq/L (ref 19–32)
Calcium: 9.8 mg/dL (ref 8.4–10.5)
Chloride: 107 meq/L (ref 96–112)
Cholesterol: 161 mg/dL (ref 0–200)
Creatinine, Ser: 0.76 mg/dL (ref 0.40–1.20)
Eosinophils Absolute: 0.1 10*3/uL (ref 0.0–0.7)
Eosinophils Relative: 1 % (ref 0–5)
Glucose, Bld: 96 mg/dL (ref 70–99)
HCT: 41.6 % (ref 36.0–46.0)
HDL: 35 mg/dL — ABNORMAL LOW (ref >39)
HIV 1 RNA Quant: 53 copies/mL — ABNORMAL HIGH (ref <50)
HIV-1 RNA Quant, Log: 1.72 — ABNORMAL HIGH (ref <1.70)
Hemoglobin: 14 g/dL (ref 12.0–15.0)
LDL Cholesterol: 84 mg/dL (ref 0–99)
Lymphocytes Relative: 35 % (ref 12–46)
Lymphs Abs: 2.6 10*3/uL (ref 0.7–3.3)
MCHC: 33.7 g/dL (ref 30.0–36.0)
MCV: 96.7 fL (ref 78.0–100.0)
Monocytes Absolute: 0.6 10*3/uL (ref 0.2–0.7)
Monocytes Relative: 7 % (ref 3–11)
Neutro Abs: 4.2 10*3/uL (ref 1.7–7.7)
Neutrophils Relative %: 56 % (ref 43–77)
Platelets: 221 10*3/uL (ref 150–400)
Potassium: 3.4 meq/L — ABNORMAL LOW (ref 3.5–5.3)
RBC: 4.3 M/uL (ref 3.87–5.11)
RDW: 13.5 % (ref 11.5–14.0)
Sodium: 141 meq/L (ref 135–145)
Total Bilirubin: 0.6 mg/dL (ref 0.3–1.2)
Total CHOL/HDL Ratio: 4.6
Total Protein: 8.4 g/dL — ABNORMAL HIGH (ref 6.0–8.3)
Triglycerides: 210 mg/dL — ABNORMAL HIGH (ref <150)
VLDL: 42 mg/dL — ABNORMAL HIGH (ref 0–40)
WBC: 7.5 10*3/uL (ref 4.0–10.5)

## 2010-02-20 NOTE — Miscellaneous (Signed)
Summary: RW Financial Update

## 2010-02-20 NOTE — Progress Notes (Signed)
Summary: [place]/pt assist med arrived for Mar  Phone Note Refill Request      Prescriptions: ATRIPLA 600-200-300 MG TABS (EFAVIRENZ-EMTRICITAB-TENOFOVIR) Take 1 tablet by mouth once a day  #30 x 0   Entered by:   Paulo Fruit  BS,CPht II,MPH   Authorized by:   Cliffton Asters MD   Signed by:   Paulo Fruit  BS,CPht II,MPH on 04/13/2009   Method used:   Samples Given   RxID:   [id number]   Patient Assist Medication Verification: Medication: Atripla Lot# [id number] Exp Date:07 2013 Tech approval:MLD Call placed to patient with message that assistance medications are ready for pick-up. Paulo Fruit  BS,CPht II,MPH  April 13, 2009 2:11 PM

## 2010-02-20 NOTE — Miscellaneous (Signed)
  Clinical Lists Changes  Observations: Added new observation of YEARAIDSPOS: 2002  (12/13/2009 8:54) Added new observation of HIV STATUS: CDC-defined AIDS  (12/13/2009 8:54)

## 2010-02-20 NOTE — Miscellaneous (Signed)
Summary: RW Financial Update   Clinical Lists Changes  Observations: Added new observation of PCTFPL: 122.93  (12/18/2009 14:39) Added new observation of #CHILD<18 IN: 2  (12/18/2009 14:39)     rw

## 2010-02-20 NOTE — Progress Notes (Signed)
Summary: [place] med arrived for Mar  Phone Note Refill Request      Prescriptions: ATRIPLA 600-200-300 MG TABS (EFAVIRENZ-EMTRICITAB-TENOFOVIR) Take 1 tablet by mouth once a day  #30 x 0   Entered by:   Paulo Fruit  BS,CPht II,MPH   Authorized by:   Cliffton Asters MD   Signed by:   Paulo Fruit  BS,CPht II,MPH on 03/21/2009   Method used:   Samples Given   RxID:   [id number]   Patient Assist Medication Verification: Medication: Atripla Lot# [id number] Exp Date:02 2013 Tech approval:MLD Call placed to patient with message that assistance medications are ready for pick-up. Left message w/ husband. Paulo Fruit  BS,CPht II,MPH  March 21, 2009 12:05 PM

## 2010-02-20 NOTE — Miscellaneous (Signed)
Summary: RW Updates  Clinical Lists Changes  Observations: Added new observation of #CHILD<18 IN: Yes-2 (12/22/2009 9:55) Added new observation of PCTFPL: 122.93  (12/22/2009 9:55)

## 2010-02-20 NOTE — Assessment & Plan Note (Signed)
Summary: f/u [mkj]   Referring Provider:  Cliffton Asters, MD Primary Provider:  Cliffton Asters, MD  CC:  follow-up visit, does have visual disturbances, knots in both arm pits, has not had a mammogram this year, given scholarship application, and Depression.  History of Present Illness: Daisy Salinas is in for her routine visit.  Unfortunately, she has been off all medications for several months.  She met with our financial counselor this morning to submit paperwork for medication assistance.  Her husband, [name], lost his job recently.  She states that she's been more depressed ever since her daughter died suddenly last year.  She says that she has thoughts about life not been worth living but says she has no plans to hurt herself.  She has relapsed and has been using cocaine two to 3 times weekly.  She says that following cocaine use she always feels worse and more depressed.  She has been going to the [place] [facility] sporadically but has not been here in about one month.  Depression History:      The patient is having a depressed mood most of the day and has a diminished interest in her usual daily activities.        Suicide risk questions reveal that she does not feel like life is worth living.  The patient denies that she wishes that she were dead and denies that she has thought about ending her life.        Comments:  increased drug use, not taking B/P medications although she has plenty in the home. Jennet Maduro RN  November 30, 2009 10:08 AM .   Preventive Screening-Counseling & Management  Alcohol-Tobacco     Alcohol drinks/day: 0     Smoking Status: current     Smoking Cessation Counseling: yes     Smoke Cessation Stage: precontemplative     Packs/Day: 0.5     Year Quit: 2009  Caffeine-Diet-Exercise     Caffeine use/day: sodas and coffee occassionally     Does Patient Exercise: yes     Type of exercise: walking     Exercise (avg: min/session): <30     Times/week:  <3  Hep-HIV-STD-Contraception     HIV Risk: no risk noted     HIV Risk Counseling: not indicated-no HIV risk noted  Safety-Violence-Falls     Seat Belt Use: yes  Comments: given condoms      Sexual History:  currently monogamous.        Drug Use:  current, intranasal cocaine, and 3 times a week.     Updated Prior Medication List: she has been off all of her meds Current Allergies (reviewed today): No known allergies  Social History: Drug Use:  current, intranasal cocaine, 3 times a week  Vital Signs:  Patient profile:   50 year old female Menstrual status:  hysterectomy Height:      59 inches (149.86 cm) Weight:      146.5 pounds (66.59 kg) BMI:     29.70 Temp:     97.8 degrees F (36.56 degrees C) oral Pulse rate:   71 / minute BP sitting:   183 / 109  (left arm) Cuff size:   regular  Vitals Entered By: Jennet Maduro RN (November 30, 2009 9:55 AM) CC: follow-up visit, does have visual disturbances, knots in both arm pits, has not had a mammogram this year, given scholarship application, Depression Pain Assessment Patient in pain? no      Nutritional Status BMI  of 25 - 29 = overweight Nutritional Status Detail appetite "great"  Have you ever been in a relationship where you felt threatened, hurt or afraid?not asked, husband present   Does patient need assistance? Functional Status Self care Ambulation Normal Comments no medications for the past two months   Physical Exam  General:  alert and overweight-appearing.   Mouth:  pharynx pink and moist, no erythema, and no exudates.   Lungs:  normal respiratory effort, no crackles, and no wheezes.   Heart:  normal rate, regular rhythm, no murmur, no gallop, and no rub.   Abdomen:  soft, non-tender, normal bowel sounds, and no masses.   Msk:  she has pain with flexion of her right thumb but no obvious infiltration is present other than that. Skin:  no rashes.   Axillary Nodes:  no R axillary adenopathy and no L  axillary adenopathy.   Psych:  normally interactive, good eye contact, and not anxious appearing.  she appears quite depressed and was tearful throughout the exam.  Her speech and conversation is appropriate.        Medication Adherence: 11/30/2009   Adherence to medications reviewed with patient. Counseling to provide adequate adherence provided                                Impression & Recommendations:  Problem # 1:  HIV INFECTION (ICD-042) As expected, her infection is out of control.  Will try to get her restarted on her medications as soon as possible. Her updated medication list for this problem includes:    Zithromax Z-pak 250 Mg Tabs (Azithromycin) .Marland Kitchen... Take two tabs first day then one tab a day for four days  Diagnostics Reviewed:  CD4: 810 (11/10/2009)   WBC: 4.7 (11/09/2009)   Hgb: 13.4 (11/09/2009)   HCT: 39.0 (11/09/2009)   Platelets: 139 (11/09/2009) HIV-1 RNA: 48300 (11/09/2009)   HBSAg: NO (03/17/2006)  Orders: Est. Patient Level IV (47425) Misc. Referral (Misc. Ref) Misc. Referral (Misc. Ref)  Problem # 2:  DEPRESSION (ICD-311) She continues to suffer from acute grief reaction over the death of her daughter last year and this is worsening her chronic depression.  This has also led to a relapse of her cocaine use. She is willing to talk to our mental health and substance abuse counselor.  She may benefit from referral to hospice for grief counseling for transportation is an issue. Her updated medication list for this problem includes:    Zoloft 100 Mg Tabs (Sertraline hcl) .Marland Kitchen... Take 1 tablet by mouth once a day    Wellbutrin Sr 150 Mg Xr12h-tab (Bupropion hcl) .Marland Kitchen... Take 1 tablet by mouth once a day  Orders: Est. Patient Level IV (95638) Misc. Referral (Misc. Ref) Misc. Referral (Misc. Ref)  Problem # 3:  SUBSTANCE ABUSE, MULTIPLE (ICD-305.90) see above. Orders: Est. Patient Level IV (75643) Misc. Referral (Misc. Ref) Misc. Referral (Misc.  Ref)  Other Orders: Influenza Vaccine NON MCR (32951) Pneumococcal Vaccine (88416) Admin 1st Vaccine (60630)  Patient Instructions: 1)  Please schedule a follow-up appointment in 1 month.    Immunizations Administered:  Pneumonia Vaccine:    Vaccine Type: Pneumovax    Site: right deltoid    Mfr: Merck    Dose: 0.5 ml    Route: IM    Given by: Jennet Maduro RN    Exp. Date: 05/23/2011    Lot #: [id number]     Influenza Vaccine  Vaccine Type: Fluvax Non-MCR    Site: left deltoid    Mfr: novartis    Dose: 0.5 ml    Route: IM    Given by: Jennet Maduro RN    Exp. Date: 04/22/2010    Lot #: [id number]  Flu Vaccine Consent Questions    Do you have a history of severe allergic reactions to this vaccine? no    Any prior history of allergic reactions to egg and/or gelatin? no    Do you have a sensitivity to the preservative Thimersol? no    Do you have a past history of Guillan-Barre Syndrome? no    Do you currently have an acute febrile illness? no    Have you ever had a severe reaction to latex? no    Vaccine information given and explained to patient? yes    Are you currently pregnant? no

## 2010-02-20 NOTE — Progress Notes (Signed)
Summary: [place]/pt assist med arrived for Jan  Phone Note Refill Request      Prescriptions: ATRIPLA 600-200-300 MG TABS (EFAVIRENZ-EMTRICITAB-TENOFOVIR) Take 1 tablet by mouth once a day  #30 x 0   Entered by:   Paulo Fruit  BS,CPht II,MPH   Authorized by:   Cliffton Asters MD   Signed by:   Paulo Fruit  BS,CPht II,MPH on 02/15/2009   Method used:   Samples Given   RxID:   [id number]   Patient Assist Medication Verification: Medication: Atripla Lot#: [id number] Exp Date:07 2013 Tech approval:MLD Call placed to patient with message that assistance medications are ready for pick-up. Left message with husband, [name]. Paulo Fruit  BS,CPht II,MPH  February 15, 2009 4:54 PM

## 2010-02-22 NOTE — Assessment & Plan Note (Signed)
Summary: F/U [MKJ]   Referring Provider:  Cliffton Asters, MD Primary Provider:  Cliffton Asters, MD  CC:  follow-up visit.  History of Present Illness: Daisy Salinas is in for her routine visit.  Unfortunately, she has not been notified about her Atripla yet even though a prescription was called into [facility] on November 22 after she was reapproved for ADAP. She says that she remains depressed.  She is only taking Seroquel at bedtime and states that when she saw her counselor last month they did not want to start her on any other new medicines for depression because her blood pressure was elevated.  Since that time she has restarted her hydrochlorothiazide.  She says she is desperate to quit smoking cigarettes but does not have a current plan to quit.  Preventive Screening-Counseling & Management  Alcohol-Tobacco     Alcohol drinks/day: 0     Smoking Status: current     Smoking Cessation Counseling: yes     Smoke Cessation Stage: precontemplative     Packs/Day: 0.5     Year Quit: 2009  Caffeine-Diet-Exercise     Caffeine use/day: sodas and coffee occassionally     Does Patient Exercise: yes     Type of exercise: walking     Exercise (avg: min/session): <30     Times/week: <3  Hep-HIV-STD-Contraception     HIV Risk: no risk noted     HIV Risk Counseling: not indicated-no HIV risk noted  Safety-Violence-Falls     Seat Belt Use: yes  Comments: given condoms      Sexual History:  currently monogamous.        Drug Use:  current, crack cocaine, and 3-4 times a week.     Updated Prior Medication List: HYDROCHLOROTHIAZIDE 25 MG TABS (HYDROCHLOROTHIAZIDE) Take 1 tablet by mouth once a day SEROQUEL 100 MG TABS (QUETIAPINE FUMARATE) 2 tablets by mouth at bedtime PROVENTIL HFA 108 (90 BASE) MCG/ACT AERS (ALBUTEROL SULFATE) two puffs four times a day as needed cough AEROCHAMBER PLUS  MISC (SPACER/AERO-HOLDING CHAMBERS) use this with your albuterol inhaler, ask pharmacy to help you  with how to use this  Current Allergies (reviewed today): No known allergies  Social History: Drug Use:  current, crack cocaine, 3-4 times a week  Vital Signs:  Patient profile:   50 year old female Menstrual status:  hysterectomy Height:      59 inches (149.86 cm) Weight:      147 pounds (66.82 kg) Temp:     98.0 degrees F (36.67 degrees C) oral Pulse rate:   89 / minute BP sitting:   132 / 86  (right arm) Cuff size:   regular  Vitals Entered By: Jennet Maduro RN (January 04, 2010 10:41 AM) CC: follow-up visit Is Patient Diabetic? No Pain Assessment Patient in pain? no      Nutritional Status BMI of 25 - 29 = overweight Nutritional Status Detail appetite "good"  Have you ever been in a relationship where you felt threatened, hurt or afraid?not asked husband present   Does patient need assistance? Functional Status Self care Ambulation Normal Comments was just ADAP approved recently, has not had rxes to take them   Physical Exam  General:  alert and overweight-appearing.   Mouth:  pharynx pink and moist, no erythema, and no exudates.   Lungs:  normal respiratory effort, no crackles, and no wheezes.   Heart:  normal rate, regular rhythm, no murmur, no gallop, and no rub.   Skin:  no rashes.  Psych:  not anxious appearing, flat affect, and subdued.          Medication Adherence: 01/04/2010   Adherence to medications reviewed with patient. Counseling to provide adequate adherence provided   Prevention For Positives: 01/04/2010   Safe sex practices discussed with patient. Condoms offered.                             Impression & Recommendations:  Problem # 1:  HIV INFECTION (ICD-042) We will recheck with her pharmacy and try to get her restarted on Atripla as soon as possible. The following medications were removed from the medication list:    Zithromax Z-pak 250 Mg Tabs (Azithromycin) .Marland Kitchen... Take two tabs first day then one tab a day for four  days  Diagnostics Reviewed:  HIV: CDC-defined AIDS (12/13/2009)   CD4: 810 (11/10/2009)   WBC: 4.7 (11/09/2009)   Hgb: 13.4 (11/09/2009)   HCT: 39.0 (11/09/2009)   Platelets: 139 (11/09/2009) HIV-1 RNA: 48300 (11/09/2009)   HBSAg: NO (03/17/2006)  Orders: Est. Patient Level IV (99214)Future Orders: T-CD4SP ([facility]) (CD4SP) ... 02/15/2010 T-HIV Viral Load [id number]) ... 02/15/2010 T-Comprehensive Metabolic Panel [id number]) ... 02/15/2010 T-CBC w/Diff ([id number]) ... 02/15/2010 T-RPR (Syphilis) [phone]) ... 02/15/2010 T-Lipid Profile [id number]) ... 02/15/2010  Problem # 2:  HYPERTENSION (ICD-401.9) Her blood pressure is under better control. Her updated medication list for this problem includes:    Hydrochlorothiazide 25 Mg Tabs (Hydrochlorothiazide) .Marland Kitchen... Take 1 tablet by mouth once a day  Orders: Est. Patient Level IV (64332)  Problem # 3:  CIGARETTE SMOKER (ICD-305.1) I talked with her about a cigarette cessation plan have given her written information about local resources to help her quit. Orders: Est. Patient Level IV (95188)  Medications Added to Medication List This Visit: 1)  Atripla 600-200-300 Mg Tabs (Efavirenz-emtricitab-tenofovir) .... Take 1 tablet by mouth once a day  Patient Instructions: 1)  Please schedule a follow-up appointment in 2 months.  Prescriptions: HYDROCHLOROTHIAZIDE 25 MG TABS (HYDROCHLOROTHIAZIDE) Take 1 tablet by mouth once a day  #30 Each x 10   Entered and Authorized by:   Cliffton Asters MD   Signed by:   Cliffton Asters MD on 01/04/2010   Method used:   Electronically to        [facility] [phone]* (retail)       [address]       [place], [place]  [address]       Ph: [phone]       Fax: [phone]   RxID:   [id number]

## 2010-03-08 ENCOUNTER — Encounter: Payer: Self-pay | Admitting: Internal Medicine

## 2010-03-08 ENCOUNTER — Ambulatory Visit (INDEPENDENT_AMBULATORY_CARE_PROVIDER_SITE_OTHER): Payer: Self-pay | Admitting: Internal Medicine

## 2010-03-08 DIAGNOSIS — I10 Essential (primary) hypertension: Secondary | ICD-10-CM

## 2010-03-08 DIAGNOSIS — F329 Major depressive disorder, single episode, unspecified: Secondary | ICD-10-CM

## 2010-03-08 DIAGNOSIS — B20 Human immunodeficiency virus [HIV] disease: Secondary | ICD-10-CM

## 2010-03-08 DIAGNOSIS — F1910 Other psychoactive substance abuse, uncomplicated: Secondary | ICD-10-CM

## 2010-03-14 NOTE — Assessment & Plan Note (Signed)
Summary: 64month f/u [facility])   Vital Signs:  Patient profile:   50 year old female Menstrual status:  hysterectomy Height:      59 inches (149.86 cm) Weight:      153.8 pounds (69.91 kg) BMI:     31.18 Temp:     98.1 degrees F (36.72 degrees C) oral Pulse rate:   82 / minute BP sitting:   176 / 109  (left arm)  Vitals Entered By: Wendall Mola CMA Duncan Dull) (March 08, 2010 3:55 PM) CC: follow-up visit, lab results, B/P elevated pt. has been out of B/P med. for one week Is Patient Diabetic? No Pain Assessment Patient in pain? no      Nutritional Status BMI of > 30 = obese Nutritional Status Detail appetite "good"  Have you ever been in a relationship where you felt threatened, hurt or afraid?No   Does patient need assistance? Functional Status Self care Ambulation Normal Comments no missed doses of Atripla per pt.   Referring Provider:  Cliffton Asters, MD Primary Provider:  Cliffton Asters, MD  CC:  follow-up visit, lab results, and B/P elevated pt. has been out of B/P med. for one week.  History of Present Illness: Daisy Salinas is in for her routine visit.  Initially, she denies any problems but then admits that she has been having increasing frustration and difficulty dealing with other people.  She has been getting into arguments with some other woman at work and finds that she is very short tempered and the patient.  When I asked her if she felt that other people around her head changed or if the change was actually in her over the last year she readily admitted that it was her that it changed.  She admits that she has been back using cocaine and drinking wine for the past year.  It sounds like she does most of her drug and alcohol use with extended family.  She says she does see her counselor every two months and did attend a few NA meetings recently on her own but says that she is not comfortable with more intensive therapy a cough she feels like it would come across as  "showing up her family".   She did restart Atripla shortly after her visit in December. She does not recall missing any doses.  She has been out of her blood pressure medication for the past week.  Preventive Screening-Counseling & Management  Alcohol-Tobacco     Alcohol drinks/day: 0     Smoking Status: current     Smoking Cessation Counseling: yes     Smoke Cessation Stage: precontemplative     Packs/Day: 0.5     Year Quit: 2009  Caffeine-Diet-Exercise     Caffeine use/day: sodas and coffee occassionally     Does Patient Exercise: no     Type of exercise: walking     Exercise (avg: min/session): <30     Times/week: <3  Hep-HIV-STD-Contraception     HIV Risk: no risk noted     HIV Risk Counseling: not indicated-no HIV risk noted  Safety-Violence-Falls     Seat Belt Use: yes      Sexual History:  currently monogamous.        Drug Use:  current, crack cocaine, and 3-4 times a week.    Comments: pt. given condoms  Current Medications (verified): 1)  Atripla 600-200-300 Mg Tabs (Efavirenz-Emtricitab-Tenofovir) .... Take 1 Tablet By Mouth Once A Day 2)  Hydrochlorothiazide 25 Mg Tabs (Hydrochlorothiazide) .Marland KitchenMarland KitchenMarland Kitchen  Take 1 Tablet By Mouth Once A Day 3)  Seroquel 100 Mg Tabs (Quetiapine Fumarate) .... 2 Tablets By Mouth At Bedtime 4)  Proventil Hfa 108 (90 Base) Mcg/act Aers (Albuterol Sulfate) .... Two Puffs Four Times A Day As Needed Cough 5)  Aerochamber Plus  Misc (Spacer/aero-Holding Chambers) .... Use This With Your Albuterol Inhaler, Ask Pharmacy To Help You With How To Use This  Allergies (verified): No Known Drug Allergies  Physical Exam  General:  alert and overweight-appearing.   Mouth:  pharynx pink and moist, no erythema, and no exudates.   Lungs:  normal respiratory effort, no crackles, and no wheezes.   Heart:  normal rate, regular rhythm, no murmur, no gallop, and no rub.   Skin:  no rashes.   Axillary Nodes:  no R axillary adenopathy and no L axillary  adenopathy.   Psych:  not anxious appearing.  she is not tearful as she frequently is.  Today she seems more energy, angry and frustrated.   Impression & Recommendations:  Problem # 1:  HIV INFECTION (ICD-042) Her infection is under better control since restarting her medications last December. Diagnostics Reviewed:  HIV: CDC-defined AIDS (12/13/2009)   CD4: 1110 (02/16/2010)   WBC: 5.7 (02/15/2010)   Hgb: 13.1 (02/15/2010)   HCT: 37.1 (02/15/2010)   Platelets: 148 (02/15/2010) HIV-1 RNA: 136 (02/15/2010)   HBSAg: NO (03/17/2006)  Orders: Est. Patient Level IV (99214)Future Orders: T-CD4SP ([facility]) (CD4SP) ... 04/19/2010 T-HIV Viral Load [phone]) ... 04/19/2010 T-Basic Metabolic Panel [id number]) ... 04/19/2010  Problem # 2:  SUBSTANCE ABUSE, MULTIPLE (ICD-305.90) I think her worsening mood and difficulty with her relationships is directly related by her return to wind and cocaine.  I have strongly encouraged her to see her counselor more frequently and go back to regular attendance at [facility] meetings. Orders: Est. Patient Level IV (46962)  Problem # 3:  HYPERTENSION (ICD-401.9) Her blood pressure is not well controlled because she is not taking her medication and she is actively using cocaine.  She will pick up her hydrochlorothiazide today. Her updated medication list for this problem includes:    Hydrochlorothiazide 25 Mg Tabs (Hydrochlorothiazide) .Marland Kitchen... Take 1 tablet by mouth once a day  Orders: Est. Patient Level IV (99214)  BP today: 176/109 Prior BP: 132/86 (01/04/2010)  Labs Reviewed: K+: 3.6 (02/15/2010) Creat: : 0.89 (02/15/2010)   Chol: 137 (02/15/2010)   HDL: 32 (02/15/2010)   LDL: 55 (02/15/2010)   TG: 251 (02/15/2010)  Patient Instructions: 1)  Please schedule a follow-up appointment in 2 months.    Orders Added: 1)  T-CD4SP ([facility]) [CD4SP] 2)  T-HIV Viral Load [phone] 3)  Est. Patient Level IV [01027] 4)  T-Basic Metabolic Panel  [[id number]]          Medication Adherence: 03/08/2010   Adherence to medications reviewed with patient. Counseling to provide adequate adherence provided   Prevention For Positives: 03/08/2010   Safe sex practices discussed with patient. Condoms offered.

## 2010-04-04 ENCOUNTER — Telehealth (INDEPENDENT_AMBULATORY_CARE_PROVIDER_SITE_OTHER): Payer: Self-pay | Admitting: *Deleted

## 2010-04-04 ENCOUNTER — Encounter: Payer: Self-pay | Admitting: Licensed Clinical Social Worker

## 2010-04-04 ENCOUNTER — Encounter (INDEPENDENT_AMBULATORY_CARE_PROVIDER_SITE_OTHER): Payer: Self-pay | Admitting: *Deleted

## 2010-04-04 LAB — T-HELPER CELL (CD4) - (RCID CLINIC ONLY): CD4 T Cell Abs: 810 uL (ref 400–2700)

## 2010-04-10 NOTE — Progress Notes (Signed)
  Phone Note Other Incoming   Summary of Call: ADAP approved.  RW docs submitted to Barbara Hammond this date.    

## 2010-04-10 NOTE — Miscellaneous (Signed)
Summary: adap approved til 10/21/10  Clinical Lists Changes  Observations: Added new observation of PCTFPL: 153.77  (04/04/2010 10:08) Added new observation of HOUSEINCOME: 32440  (04/04/2010 10:08) Added new observation of AIDSDAP: Yes 2012  (04/04/2010 10:08) Added new observation of FINASSESSDT: 02/20/2010  (04/04/2010 10:08)

## 2010-04-18 ENCOUNTER — Other Ambulatory Visit: Payer: Self-pay

## 2010-04-24 LAB — T-HELPER CELL (CD4) - (RCID CLINIC ONLY)
CD4 % Helper T Cell: 39 % (ref 33–55)
CD4 T Cell Abs: 1500 uL (ref 400–2700)

## 2010-04-30 ENCOUNTER — Other Ambulatory Visit (INDEPENDENT_AMBULATORY_CARE_PROVIDER_SITE_OTHER): Payer: Self-pay

## 2010-04-30 DIAGNOSIS — B20 Human immunodeficiency virus [HIV] disease: Secondary | ICD-10-CM

## 2010-05-01 LAB — BASIC METABOLIC PANEL WITH GFR
BUN: 11 mg/dL (ref 6–23)
CO2: 25 mEq/L (ref 19–32)
Chloride: 102 mEq/L (ref 96–112)
Creat: 0.92 mg/dL (ref 0.40–1.20)
Glucose, Bld: 99 mg/dL (ref 70–99)

## 2010-05-01 LAB — HIV-1 RNA QUANT-NO REFLEX-BLD: HIV 1 RNA Quant: <20 copies/mL (ref <20)

## 2010-05-01 LAB — T-HELPER CELL (CD4) - (RCID CLINIC ONLY)
CD4 % Helper T Cell: 37 % (ref 33–55)
CD4 T Cell Abs: 1230 uL (ref 400–2700)

## 2010-05-03 LAB — T-HELPER CELL (CD4) - (RCID CLINIC ONLY)
CD4 % Helper T Cell: 36 % (ref 33–55)
CD4 T Cell Abs: 1200 uL (ref 400–2700)

## 2010-05-07 ENCOUNTER — Encounter: Payer: Self-pay | Admitting: Internal Medicine

## 2010-05-07 ENCOUNTER — Ambulatory Visit (INDEPENDENT_AMBULATORY_CARE_PROVIDER_SITE_OTHER): Payer: Self-pay | Admitting: Internal Medicine

## 2010-05-07 ENCOUNTER — Other Ambulatory Visit: Payer: Self-pay | Admitting: *Deleted

## 2010-05-07 VITALS — BP 132/91 | HR 84 | Temp 98.1°F | Ht 59.0 in | Wt 145.5 lb

## 2010-05-07 DIAGNOSIS — I10 Essential (primary) hypertension: Secondary | ICD-10-CM

## 2010-05-07 DIAGNOSIS — B20 Human immunodeficiency virus [HIV] disease: Secondary | ICD-10-CM

## 2010-05-07 DIAGNOSIS — F1910 Other psychoactive substance abuse, uncomplicated: Secondary | ICD-10-CM

## 2010-05-07 DIAGNOSIS — F329 Major depressive disorder, single episode, unspecified: Secondary | ICD-10-CM

## 2010-05-07 MED ORDER — HYDROCHLOROTHIAZIDE 25 MG PO TABS: 25.0000 mg | ORAL_TABLET | Freq: Every day | ORAL | Status: DC

## 2010-05-07 NOTE — Progress Notes (Signed)
  Subjective:    Patient ID: Daisy Salinas, female    DOB: Sep 27, 1960, 50 y.o.   MRN: [id number]  HPI Daisy Salinas is in for her routine visit. Unfortunately, she continues to use crack cocaine in drink wine. She seems a little surprised when I asked her about this. She has not gone back to [employer] and states that she wants to become sober again on her own. She states that she just needs to stay away from the people who encouraged her to use (many of them are close family members). However she denies missing any doses of her Atripla. She says she's been having trouble paying for her hydrochlorothiazide.    Review of Systems     Objective:   Physical Exam  Constitutional: She appears well-developed.       Overweight.  HENT:  Mouth/Throat: No oropharyngeal exudate.  Cardiovascular: Normal rate and regular rhythm.   No murmur heard. Pulmonary/Chest: Breath sounds normal. She has no wheezes.  Abdominal: Soft. There is no tenderness.  Psychiatric:       She has poor eye contact and is somewhat tearful during exam.          Assessment & Plan:

## 2010-05-07 NOTE — Assessment & Plan Note (Signed)
Her depression is directly related to her drug use. She does have some insight into that.

## 2010-05-07 NOTE — Assessment & Plan Note (Signed)
Her hypertension will certainly be exacerbated by her cocaine use. I have encouraged her to quit again. He was instructed to get a 3 month supply of her hydrochlorothiazide for $10 at [facility].

## 2010-05-07 NOTE — Assessment & Plan Note (Signed)
Despite her continued drug use her adherence with her HIV medicines is very good and her infection is under better control. I will continue her current regimen.

## 2010-05-07 NOTE — Assessment & Plan Note (Signed)
I have encouraged her again to start attending NA meetings. She had been sober for many many years before starting to use again about one year ago.

## 2010-06-08 NOTE — Group Therapy Note (Signed)
NAME:  Daisy Salinas, Daisy Salinas NO.:  [id number]   MEDICAL RECORD NO.:  [id number]          PATIENT TYPE:  WOC   LOCATION:  [facility]                   FACILITY:  [facility]   PHYSICIAN:  Argentina Donovan, MD        DATE OF BIRTH:  29-Dec-1960   DATE OF SERVICE:                                    CLINIC NOTE   GYN CLINIC   REASON FOR VISIT:  The patient is here for Pap smear and wet prep as this  was not done last week due to heavy vaginal bleeding.  She is HIV positive  and gets two, 42-month Pap smears.  All have been negative in the past.  Since seen here 1 week ago and receiving Depo-Provera IM, she has not had  further bleeding.  However, she has had a thick, white, vaginal discharge  that is very pruritic of the vulva and vagina, similar to yeast that she has  had in the past.  She did not use the Monistat cream as advised last time  but she did drink a large glass of vinegar thinking that would help with her  yeast infections, however, it did not help.   VITAL SIGNS:  Pulse 75.  BP 122/75.  Limited exam.  ABDOMEN:  Soft, slightly protuberant, nontender, no discrete masses.  Uterus  is felt just above the symphysis pubis.  PELVIC:  There is a slight reddening of external genitalia.  A thick, curdy,  white discharge is present.  Vagina rugated with some adherent white  discharge.  Cervix is parous.  No lesions noted.  Pap smear done.  Wet prep  is deferred.   ASSESSMENT:  1. Menometrorrhagia, now controlled status post Depo-Provera.  2. Candidiasis.   PLAN:  She is given a prescription for Diflucan 150 mg 1 p.o. now and repeat  in 2-3 days p.r.n. x2, one refill given.  Also discussed sitz baths.  She  will be following up with Dr. Orvan Falconer in 6 months and also will be due to  followup p.r.n. 6 months for another Pap smear.     ______________________________  Caren Griffins, CNM    ______________________________  Argentina Donovan, MD    DP/MEDQ  D:  11/28/2005  T:   11/29/2005  Job:  [id number]

## 2010-06-08 NOTE — Group Therapy Note (Signed)
NAME:  Daisy Salinas, CALLES NO.:  [id number]   MEDICAL RECORD NO.:  [id number]          PATIENT TYPE:  WOC   LOCATION:  [facility]                   FACILITY:  [facility]   PHYSICIAN:  Argentina Donovan, MD        DATE OF BIRTH:  [birth year]   DATE OF SERVICE:  05/22/2004                                    CLINIC NOTE   HISTORY OF PRESENT ILLNESS:  The patient is a 50 year old black female who  underwent __________ endometrial ablation in January of this year, and has  not had any bleeding since that time, although she can tell when her period  would have come by the breast tenderness and some cramping.  The patient was  in for a routine Pap smear.   PHYSICAL EXAMINATION:  GENITOURINARY:  External genitalia is normal.  BUS  within normal limits.  Vagina is clean and well rugated.  The cervix is  clean and parous.  A Pap smear was taken.  The uterus is anterior and of  normal size and consistency.  Adnexa could not be palpated.   IMPRESSION:  Normal pelvic examination.  Pap smear done.      PR/MEDQ  D:  05/22/2004  T:  05/22/2004  Job:  [id number]

## 2010-06-08 NOTE — Op Note (Signed)
NAME:  Daisy Salinas, Daisy Salinas            ACCOUNT NO.:  [id number]   MEDICAL RECORD NO.:  [id number]          PATIENT TYPE:  AMB   LOCATION:  [facility]                          [facility]   PHYSICIAN:  Tanya S. Shawnie Pons, M.D.   DATE OF BIRTH:  Mar 02, 1960   DATE OF PROCEDURE:  10/04/2004  DATE OF DISCHARGE:                                 OPERATIVE REPORT   PREOPERATIVE DIAGNOSIS:  Hematometra.   POSTOPERATIVE DIAGNOSIS:  Hematometra.   PROCEDURE:  Dilatation and curettage.   SURGEON:  Shelbie Proctor. Shawnie Pons, M.D.   ASSISTANT:  None.   ANESTHESIA:  MAC and local, Donald T. Pamalee Leyden, M.D.   SPECIMENS:  None.   ESTIMATED BLOOD LOSS:  Minimal.   COMPLICATIONS:  None.   REASON FOR PROCEDURE:  The patient is a 50 year old gravida 23, para 9-0-3-  9, who underwent endometrial ablation in January with NovaSure.  She has not  had a cycle since that time but over the last three to four weeks has had  increasing abdominal pain.  She did have an ultrasound done here on December  7 and a CT done yesterday that showed a fluid collection in the uterus  consistent with hematometra versus endometrial abscess.  The patient was  without fever or white count, felt to be a hematometra.  The patient was  consented for this procedure, and consents were all in the chart and signed  prior to procedure.   PROCEDURE:  The patient was taken to the operating room, where she was  placed in dorsal lithotomy in Allen stirrups.  When MAC anesthesia was found  to be adequate, she was prepped and draped in the usual sterile fashion.  A  red rubber catheter was used to drain the bladder.  A speculum was placed  inside the vagina, cervix visualized and injected with 3 mL of 1% lidocaine  with epinephrine.  The cervix was grasped anteriorly with a single-tooth  tenaculum and sounded to 11 cm.  Sequential [place] dilators were used to  dilate the cervix and then sharp curettage was done.  There was already a  gritty surface in all  four quadrants and no significant tissue was brought  out.  Old  chocolate dark blood was removed from the endometrial cavity.  The single-  tooth tenaculum was subsequently removed from the vagina.  Good hemostasis  was noted.  All instrument, needle and lap counts were correct x2.  The  patient was awakened and taken to the recovery room in stable condition.           ______________________________  Shelbie Proctor Shawnie Pons, M.D.     TSP/MEDQ  D:  10/04/2004  T:  10/04/2004  Job:  [id number]

## 2010-06-08 NOTE — Group Therapy Note (Signed)
NAME:  Daisy Salinas, Daisy Salinas NO.:  [id number]   MEDICAL RECORD NO.:  [id number]                   PATIENT TYPE:  OUT   LOCATION:  [facility]                           FACILITY:  [facility]   PHYSICIAN:  Argentina Donovan, MD                     DATE OF BIRTH:  22-May-1960   DATE OF SERVICE:  05/19/2003                                    CLINIC NOTE   REASON FOR VISIT:  The patient is a 50 year old black female gravida 11 para  9-0-2-9 who is being treated at the internal medicine infectious disease  clinic for HIV.  Recently moved down here from [place] where she lived  for 20-some-odd years.  She smokes cigarettes and was in the past apparently  an alcoholic but has not had anything to drink in 10 years.  Her main  complaints are that she has had very heavy periods over the past year and  keeps getting yeast infections.  We have discussed the problems with her and  decided that the best choice might be Depo-Provera every 3 months to control  the bleeding and Diflucan monthly to help control the yeast.  Will give her  ongoing prescription for Diflucan and give her an injection of Depo-Provera  today 150 and then have her get it renewed every 3 months.   PHYSICAL EXAMINATION:  External genitalia is normal, BUS within normal  limits.  The vagina is clean, heavy cottage cheese leukorrhea.  The cervix  is parous.  The uterus is upper limits of normal, probably consistent with  her grand multiparity, but regular in configuration, with normal adnexa.   IMPRESSION:  1. Normal gynecologic examination.  2. Menorrhagia.  3. Recurrent yeast vaginitis.                                               Argentina Donovan, MD    PR/MEDQ  D:  05/19/2003  T:  05/19/2003  Job:  [id number]

## 2010-06-08 NOTE — Group Therapy Note (Signed)
NAME:  Daisy Salinas, ESHAM NO.:  [id number]   MEDICAL RECORD NO.:  [id number]          PATIENT TYPE:  WOC   LOCATION[facility]                   [facility]   PHYSICIAN:  Elsie Lincoln, MD      DATE OF BIRTH:  12-18-1960   DATE OF SERVICE:  10/02/2004                                    CLINIC NOTE   Patient is a 50 year old female who was sent to [place] from Dr. Orvan Falconer for  lower abdominal pain.  Patient has several weeks of abdominal pain and had  an ultrasound in [place] and then had another ultrasound here at  [facility] on the 7th of September.  She was found to have a simple  right ovarian cyst 2 x 3 x 3 cm.  The left ovary is normal and the uterus is  normal.  Her uterus had previously been ablated for abnormal uterine  bleeding.  For the past two days the patient has been doubled over in pain,  has taken 30 of Percocet that the MAU gave her on the 7th.  She is tearful  today and would like something done.  Has not had any nausea, vomiting,  diarrhea.  However, she has been constipated for four days.  This is out of  character for the patient.  She does state, however, the pain was present  prior to the constipation.  However, the pain is on the left side not where  the cyst is and this is also where her descending colon is.  Encouraged the  patient to go to the MAU where she is going to be written for Toradol 30 mg  IM and then abdominal x-ray to rule out constipation.  If that x-ray is  negative then she will proceed with CT with IV and oral contrast.  She will  need a CMP, CBC prior to the CT.  Dr. Gavin Potters, covering physician of the day  was notified this patient was on her way to [facility]U.   PHYSICAL EXAMINATION:  GENITALIA:  Tanner V.  Vagina pink.  Urethra  nontender.  Cervix closed, nontender.  Uterus anteverted, nontender.  Bilateral adnexa grossly tender, however, no masses palpated.  ABDOMEN:  Soft, tender in lower quadrants.  No rebound.   No guarding.   ASSESSMENT/PLAN:  As above.           ______________________________  Elsie Lincoln, MD     KL/MEDQ  D:  10/02/2004  T:  10/03/2004  Job:  [id number]

## 2010-06-08 NOTE — H&P (Signed)
NAME:  Daisy Salinas, Daisy Salinas            ACCOUNT NO.:  [id number]   MEDICAL RECORD NO.:  [id number]          PATIENT TYPE:  AMB   LOCATION:                                [facility]   PHYSICIAN:  Lesly Dukes, M.D. DATE OF BIRTH:  01/02/1961   DATE OF ADMISSION:  03/12/2006  DATE OF DISCHARGE:                              HISTORY & PHYSICAL   CHIEF COMPLAINT:  Continual vaginal bleeding for 3 years.   HISTORY OF PRESENT ILLNESS:  The patient is a 49 year old African-  American female gravida 77, para 9-0-2-9 who is being treated by  [facility] for HIV.  She has had many  years of heavy periods.  She had a previous NovaSure endometrial  ablation that was unsuccessful. Hormonal therapy was also unsuccessful;  and the patient desires and is scheduled for LAVH by Dr. Elsie Lincoln.  I discussed the procedure with the patient, the possible complications,  especially those related to anesthesia, injury to bowel, urinary tract,  also those related to hemorrhage and infection.  The patient also  desires to have her ovaries out if possible.  The patient had an  endometrial biopsy on 03/12/2006 with the uterus sounding to 7 cm.   PAST MEDICAL HISTORY:  The patient was transferred down her from [place] where she was previously diagnosed with HIV.  Also treated with  hydrochlorothiazide for hypertension.   ALLERGIES:  The patient has no known medical allergies.   MEDICATIONS:  Listed on her chart.  Hydrochlorothiazide and  amitriptyline daily.  In addition we are starting her on iron therapy  which she has not been taking.   PREVIOUS SURGERY:  The endometrial ablation.   PHYSICAL EXAMINATION:  HEENT:  Within normal limits.  NECK:  Supple.  Thyroid symmetrical with no dominant masses.  BACK:  Erect.  LUNGS:  Clear to auscultation and percussion.  HEART:  No murmur.  Normal sinus rhythm.  PMI in fifth IS and MCL.  ABDOMEN:  Soft, flat, nontender.  No  masses, no organomegaly.  No CVA  tenderness.  EXTREMITIES:  No edema.  No varices.  DTRs within normal limits.  PELVIC:  External genitalia is normal.  BUS within normal limits.  Vagina is clear and well rugated.  Cervix is clean and parous.  Uterus  anterior, normal size, shape, and consistency.  Cul-de-sac is free.  RECTAL EXAM:  Reveals no dominant masses.   IMPRESSION:  1. Intractable menometrorrhagia.  2. Positive human immunodeficiency virus infection.   PLAN:  LAVH and bilateral salpingo-oophorectomy.     ______________________________  Javier Glazier Okey Dupre, M.D.    ______________________________  Lesly Dukes, M.D.    PDR/MEDQ  D:  03/12/2006  T:  03/12/2006  Job:  [id number]

## 2010-06-08 NOTE — Op Note (Signed)
NAME:  Daisy Salinas, Daisy Salinas            ACCOUNT NO.:  [id number]   MEDICAL RECORD NO.:  [id number]          PATIENT TYPE:  WOC   LOCATION:  [facility]                          FACILITY:  [facility]   PHYSICIAN:  Phil D. Okey Dupre, M.D.     DATE OF BIRTH:  1960/08/09   DATE OF PROCEDURE:  11/16/2003  DATE OF DISCHARGE:                                 OPERATIVE REPORT   PREOPERATIVE DIAGNOSIS:  Menometrorrhagia.   POSTOPERATIVE DIAGNOSIS:  Pending pathology report.   PROCEDURES:  Examination under anesthesia, dilatation and curettage, and  attempted Novasure ablation.   ANESTHESIA:  General.   SURGEON:  Phil D. Rose, M.D.   ESTIMATED BLOOD LOSS:  300 mL.   POSTOPERATIVE CONDITION:  Satisfactory.   OPERATIVE FINDINGS:  A uterus of upper limits of normal size, regular in  configuration, and with normal adnexa.  External genitalia normal.  BUS  within normal limits.  Vagina clean and well-rugated.  The cervix was clean  with a first degree prolapse.   The procedure went as follows:  Under satisfactory general anesthesia with  the patient in dorsal lithotomy position, the perineum and vagina were  prepped and draped in the usual sterile manner.  Bimanual pelvic examination  was as above.  Weighted speculum was placed in the posterior fourchette of  the vagina, the anterior lip of the cervix was grasped with a single-tooth  tenaculum.  The uterine cavity sounded to a depth of 10 cm.  The cervical os  was dilated to a #8 Hegar dilator, measuring a cervical canal that measured  3.5 cm.  The uterine cavity was curetted with a small serrated curette  followed by curettage with a stone forceps, and several polypoid areas were  removed as well as a thickened endometrium.  The uterine cavity was regular  in configuration with no abnormalities noted.  At this point the Novasure  ablator was inserted using the usual methods with a sounding length of 10, a  cervical length 3.5 and a cavity length of 6.5 and a  cavity width of 4.5  after manipulation.  We could never confirm the seal, thought first to be  the mechanism.  We tried it with a second instrument with the same result.  There was a lot of bleeding of nonclotting blood, and platelet count was  normal.  Plan on evaluating PT and PTT in the recovery room prior to her  discharge and will see her again to review the pathology in two weeks in the  GYN clinic and then consider redoing the procedure if necessary.   IMPRESSION:  Patulous grand multiparous cervix after having had nine  children, multiple pregnancies before the age of 32 was responsible for the  failure, being able to seal the cervix.      PDR/MEDQ  D:  11/16/2003  T:  11/16/2003  Job:  [id number]

## 2010-06-08 NOTE — Discharge Summary (Signed)
NAME:  AKI, BURDIN NO.:  [id number]   MEDICAL RECORD NO.:  [id number]          PATIENT TYPE:  INP   LOCATION:  9315                          [facility]   PHYSICIAN:  Lesly Dukes, M.D. DATE OF BIRTH:  22-Jul-1960   DATE OF ADMISSION:  03/25/2006  DATE OF DISCHARGE:                               DISCHARGE SUMMARY   The patient is a 50 year old female who underwent LAVH BSO on 03/25/06  secondary due to menorrhagia that was refractory to endometrial  ablation.  The patient did well during the procedure and had an EBL of  approximately 300.  Vaginal packing was placed.  Postoperatively, the  patient voided, ambulated, passed gas and tolerated oral food today.  She is eager to go home.  Postoperative hemoglobin 9.7.   DISCHARGE INSTRUCTIONS:  1. Nothing per vagina for 6 weeks.  2. No heavy lifting for 6 weeks.  3. No driving while taking Percocet.  4. Return to the hospital if heavy bleeding, fevers or chills occur.   DISCHARGE MEDICATIONS:  1. Atripla 600 mg p.o. q.h.s.  2. Hydrochlorothiazide 25 mg p.o. q. a.m.  3. Percocet 5/325, 1-2 tablets p.o. q. 4-6 h p.r.n. pain.  4. Motrin 600 mg p.o. q. 6 h p.r.n. pain.   Follow up appointment in 6 weeks in the GYN Clinic at [facility].           ______________________________  Lesly Dukes, M.D.     KHL/MEDQ  D:  03/26/2006  T:  03/26/2006  Job:  [id number]

## 2010-06-08 NOTE — Group Therapy Note (Signed)
NAME:  Daisy Salinas, Daisy Salinas NO.:  [id number]   MEDICAL RECORD NO.:  [id number]          PATIENT TYPE:  WOC   [facility]ATION[facility]                   [facility]   PHYSICIAN:  Tinnie Gens, MD        DATE OF BIRTH:  27-Feb-1960   DATE OF SERVICE:  05/02/2006                                  CLINIC NOTE   CHIEF COMPLAINT:  Postoperative check.   HISTORY OF PRESENT ILLNESS:  The patient is a 50 year old HIV positive  patient who underwent LAVH and BSO by Dr. Elsie Lincoln on March 25, 2006.  She is here today for a follow up postoperative check.  She  reports she is doing very well.  Her stitches in her abdomen have not  yet completely dissolved, but the incisions look good.  She has no  significant vaginal discharge.  She is having hot flashes and complains  of these regularly.   EXAMINATION:  VITALS:  As in the chart.  She is a well-developed, well-nourished female in no acute distress.  Incisions on abdomen well heeled.  GU:  She has some atrophy starting around periurethral, but the vagina  remains pink.  There is a bit of retained suture noted at the vaginal  cup, otherwise vaginal is clean.  There is no pelvic mass.   IMPRESSION:  Postoperative check number two, surgical sterilization.   PLAN:  1. May return to work.  2. Follow up as needed.  3. We will start Premarin 0.625 mg to control hot flashes as well as      osteoporosis protection.  I did discuss with her at length risk of      hormone, some increased risk of breast cancer for 5 years and the      patient should be followed with a yearly mammograms.  The patient      understood all of this and agrees.  She will let us know if she has      any problems with this.           ______________________________  Tinnie Gens, MD     TP/MEDQ  D:  05/02/2006  T:  05/02/2006  Job:  [id number]

## 2010-06-08 NOTE — Group Therapy Note (Signed)
NAME:  Daisy Salinas, ENDE NO.:  [id number]   MEDICAL RECORD NO.:  [id number]          PATIENT TYPE:  WOC   LOCATION:  [facility]                   [facility]   PHYSICIAN:  Argentina Donovan, MD        DATE OF BIRTH:  [birth year]   DATE OF SERVICE:                                  CLINIC NOTE   The patient is a 50 year old African American female with dysfunctional  bleeding for a period of three years underwent NovaSure endometrial  ablation as well as hormonal treatment but to no avail in controlling  her DUB.  In addition, she is positive for HIV and is scheduled by Dr.  Penne Lash for LAVH.  Endometrial biopsy is done today.  The uterus was  sounded to 7 cm.  History and physical is also dictated on the hospital  line for surgery on March 25, 2006.           ______________________________  Argentina Donovan, MD     PR/MEDQ  D:  03/12/2006  T:  03/12/2006  Job:  [id number]

## 2010-06-08 NOTE — Op Note (Signed)
NAME:  Daisy Salinas, Daisy Salinas            ACCOUNT NO.:  [id number]   MEDICAL RECORD NO.:  [id number]          PATIENT TYPE:  AMB   LOCATION:  [facility]C                           FACILITY:  [facility]   PHYSICIAN:  Lesly Dukes, M.D. DATE OF BIRTH:  09-Oct-1960   DATE OF PROCEDURE:  03/25/2006  DATE OF DISCHARGE:                               OPERATIVE REPORT   PREOPERATIVE DIAGNOSIS:  A 50 year old female with intractable  menorrhagia, pelvic pain and fibroids.   POSTOPERATIVE DIAGNOSIS:  A 50 year old female with intractable  menorrhagia, pelvic pain and fibroids.   PROCEDURE:  Laparoscopically assisted vaginal hysterectomy/bilateral  salpingo-oophorectomy.   SURGEON:  Lesly Dukes, M.D.   ASSISTANT:  Ginger Carne, MD.   ANESTHESIA:  General anesthesia.   SPECIMENS:  Uterus, cervix, fallopian tubes and ovaries.   ESTIMATED BLOOD LOSS:  300 mL.   COMPLICATIONS:  None.   FINDINGS:  About 8-10-week size uterus, grossly normal ovaries and  fallopian tubes.   DESCRIPTION OF PROCEDURE:  After informed consent was obtained, patient  taken to the operating room where general anesthesia was induced.  The  patient was placed in dorsal lithotomy position, prepped and draped in  normal sterile fashion.  Foley was in the bladder.  Speculum placed in  the vagina and the anterior lip of the cervix was grasped with single-  tooth tenaculum and a Hulka clip was placed for uterine mobilization.  Gown and gloves were changed.  An infraumbilical skin incision  was made  after injection with Marcaine and using the open method, the peritoneal  cavity was entered.  A pneumoperitoneum was achieved with CO2.  Two 5 mm  ports were inserted on the left portion of the abdomen safely away from  vessels.  This was done under direct visualization.  Using bipolar  cautery and sharp dissection, the round ligaments and infundibulopelvic  ligaments were cauterized and transected.  The ureter was visualized on  both sides.  At this time, the vaginal portion of the procedure was  started.  The cervix was infiltrated with Pitressin and inscribed  circumferentially.  The peritoneum was identified anterior and posterior  and entered safely. Retractors were placed intraperitoneally.  Using a  stepwise bite with Heaney clamps, the cardinal ligaments, uterosacral  ligaments and uterine arteries were clamped, transected and suture  ligated with 0 Vicryl.  Portions of the broad ligament and ascending  uterine arteries were clamped with Heaney clamps, transected and suture  ligated with 0 Vicryl.  The uterus was then cored in order to remove it  from the abdomen.  One last remaining pedicle of the broad ligament were  clamped, transected and suture ligated with 0 Vicryl.  There was good  hemostasis from all pedicles.  The peritoneum and vaginal cuff were  sewed together in a running locked fashion to aid in hemostasis.  The  vaginal cuff was then closed with 0 Vicryl in a running locked fashion.  The vagina was  packed with clindamycin packing.  Gown and gloves were then changed  again and one last look laparoscopically revealed excellent hemostasis  above the cuff  and all pedicles.  The patient tolerated the procedure  well.  Sponge, lap, needle and instrument counts correct x2.  Patient to  recovery room in stable condition.           ______________________________  Lesly Dukes, M.D.     KHL/MEDQ  D:  03/25/2006  T:  03/25/2006  Job:  [id number]

## 2010-06-08 NOTE — Op Note (Signed)
NAME:  Daisy Salinas, CAMARENA            ACCOUNT NO.:  [id number]   MEDICAL RECORD NO.:  [id number]          PATIENT TYPE:  AMB   LOCATION:  [facility]C                           [facility]   PHYSICIAN:  Phil D. Okey Dupre, M.D.     DATE OF BIRTH:  03/13/1960   DATE OF PROCEDURE:  02/13/2004  DATE OF DISCHARGE:                                 OPERATIVE REPORT   PREOPERATIVE DIAGNOSIS:  Intractable menometrorrhagia.   POSTOPERATIVE DIAGNOSIS:  Intractable menometrorrhagia.   PROCEDURE:  NovaSure endometrial ablation.   SURGEON:  Javier Glazier. Okey Dupre, M.D.   ANESTHESIA:  General.   ESTIMATED BLOOD LOSS:  Minimal.   REASON FOR PROCEDURE:  Approximately a month and a half ago we attempted  this same procedure.  A hysteroscopy and D&C were done at the time and in  this grand multiparous patient, para 9-0-2-9, I think we dilated the cervix  too much so that we could not get appropriate seal for the test, and  therefore we aborted the procedure.  Without a D&C or without hysteroscopy  this time, we are going to try and do the procedure.   Satisfactory general anesthesia, the patient in the dorsal lithotomy  position, perineum and vagina prepped and draped in the usual sterile  manner.  Bimanual pelvic examination under anesthesia revealed a uterus of  upper normal limits in size, shape, and normal consistency.  A weighted  speculum was placed in the posterior fourchette of the vagina.  The anterior  lip of the cervix was grasped with a single-tooth tenaculum and the uterine  cavity sounded to 9 cm.  The cervical os was dilated to a #7 Hegar dilator  and the depth of the internal os was calculated at 4 cm.  The NovaSure  ablator was set appropriately and inserted to the fundus.  The usual  manipulations were made to make sure that it was well-seated, and the  procedure was carried out without incident.  The tenaculum and speculum were  removed from the vagina and the patient transferred to the recovery room  in  satisfactory condition.                                               ______________________________  Javier Glazier Okey Dupre, M.D.    PDR/MEDQ  D:  02/13/2004  T:  02/13/2004  Job:  [id number]

## 2010-06-08 NOTE — Group Therapy Note (Signed)
NAME:  SUJATA, MAINES NO.:  [id number]   MEDICAL RECORD NO.:  [id number]          PATIENT TYPE:  WOC   LOCATION:  [facility]                   FACILITY:  [facility]   PHYSICIAN:  Argentina Donovan, MD        DATE OF BIRTH:  1960/04/04   DATE OF SERVICE:  10/25/2003                                    CLINIC NOTE   REASON FOR VISIT:  The patient is a 50 year old black female gravida 11 para  9-0-2-9 who is being treated by internal medicine infectious disease clinic  for HIV.  Recently moved from [place] where she had been for 20-some  years and has had problems with heavy periods over the past years and  bleeding between.  We tried to control this with Depo-Provera but had no  luck doing that.  Physical examination is normal with the exception of a  slightly-elevated-size uterus, probably consistent with her grand  multiparity.  It is regular in configuration and she has normal adnexa.   We have discussed a D&C and NovaSure ablation as an alternative to try as  she really is not anxious to have major surgery.  Will go ahead and schedule  that and hopefully that will control the problem that the patient has.      PR/MEDQ  D:  10/25/2003  T:  10/25/2003  Job:  [id number]

## 2010-06-08 NOTE — Group Therapy Note (Signed)
NAME:  Daisy Salinas, Daisy Salinas NO.:  [id number]   MEDICAL RECORD NO.:  [id number]          PATIENT TYPE:  WOC   LOCATION:  [facility]                   [facility]ACILITY:  [facility]   PHYSICIAN:  Elsie Lincoln, MD      DATE OF BIRTH:  Apr 15, 1960   DATE OF SERVICE:  02/10/2006                                  CLINIC NOTE   CLINIC NOTE   The patient is a 50 year old female who presents for recurrent  menorrhagia, not responsive to __________  and Depo-Provera.  Patient  would like a hysterectomy.  She has had 8 vaginal deliveries.  She would  like her ovaries taken out as well, which is __________ LAVH.  The  patient is HIV positive, and we need to get a viral lid, also do a  repeat endometrial biopsy because her last biopsy was in 2005, and we  need to make sure there is no hyperplasia.  The patient will come back  in 4 weeks for that.  In the meantime we will scheduled her for LAVH.           ______________________________  Elsie Lincoln, MD     KL/MEDQ  D:  02/10/2006  T:  02/10/2006  Job:  [id number]

## 2010-07-31 ENCOUNTER — Other Ambulatory Visit: Payer: Self-pay

## 2010-08-02 ENCOUNTER — Other Ambulatory Visit: Payer: Self-pay | Admitting: Internal Medicine

## 2010-08-02 ENCOUNTER — Other Ambulatory Visit (INDEPENDENT_AMBULATORY_CARE_PROVIDER_SITE_OTHER): Payer: Worker's Compensation

## 2010-08-02 DIAGNOSIS — B20 Human immunodeficiency virus [HIV] disease: Secondary | ICD-10-CM

## 2010-08-03 LAB — HIV-1 RNA QUANT-NO REFLEX-BLD
HIV 1 RNA Quant: <20 copies/mL (ref <20)
HIV-1 RNA Quant, Log: <1.3 {Log} (ref <1.30)

## 2010-08-03 LAB — T-HELPER CELL (CD4) - (RCID CLINIC ONLY)
CD4 % Helper T Cell: 37 % (ref 33–55)
CD4 T Cell Abs: 1100 uL (ref 400–2700)

## 2010-08-14 ENCOUNTER — Ambulatory Visit (INDEPENDENT_AMBULATORY_CARE_PROVIDER_SITE_OTHER): Payer: Self-pay | Admitting: Internal Medicine

## 2010-08-14 ENCOUNTER — Encounter: Payer: Self-pay | Admitting: Internal Medicine

## 2010-08-14 VITALS — BP 147/94 | HR 73 | Temp 98.0°F | Ht 59.0 in | Wt 135.5 lb

## 2010-08-14 DIAGNOSIS — F1910 Other psychoactive substance abuse, uncomplicated: Secondary | ICD-10-CM

## 2010-08-14 DIAGNOSIS — B20 Human immunodeficiency virus [HIV] disease: Secondary | ICD-10-CM

## 2010-08-14 DIAGNOSIS — F17200 Nicotine dependence, unspecified, uncomplicated: Secondary | ICD-10-CM

## 2010-08-14 DIAGNOSIS — I10 Essential (primary) hypertension: Secondary | ICD-10-CM

## 2010-08-14 NOTE — Assessment & Plan Note (Signed)
We will have her meet with her case manager to see if she qualifies for any medication assistance programs.

## 2010-08-14 NOTE — Assessment & Plan Note (Signed)
Her infection remains under excellent control. I will continue her current regimen. 

## 2010-08-14 NOTE — Progress Notes (Signed)
  Subjective:    Patient ID: Daisy Salinas, female    DOB: 1960-11-23, 50 y.o.   MRN: [id number]  HPI Daisy Salinas is in for her routine visit. She denies missing a single dose of her HIV medications since her last visit but has not been able to obtain hydrochlorothiazide. She has cut down on her cocaine use and attended one NA meetings recently. She wants to quit smoking cigarettes. She has been bothered by right upper back discomfort and intermittent aching in her right arm.    Review of Systems     Objective:   Physical Exam  Constitutional:       She looks slightly uncomfortable and keeps rubbing her right shoulder.  HENT:  Mouth/Throat: Oropharynx is clear and moist. No oropharyngeal exudate.  Cardiovascular: Normal heart sounds.   No murmur heard. Pulmonary/Chest: Breath sounds normal. She has no wheezes. She has no rales.  Musculoskeletal:       She has tenderness with palpation over her right trapezius muscle. She has no pain or crepitus with range of motion of her right shoulder.  Psychiatric:       She has a flat affect as usual.          Assessment & Plan:

## 2010-08-14 NOTE — Assessment & Plan Note (Signed)
I encouraged her to continue to attend regular NA meetings.

## 2010-08-14 NOTE — Assessment & Plan Note (Signed)
I gave her the telephone number for the [facility] quit line.

## 2010-10-19 LAB — T-HELPER CELL (CD4) - (RCID CLINIC ONLY)
CD4 % Helper T Cell: 37
CD4 T Cell Abs: 1240

## 2010-10-30 LAB — T-HELPER CELL (CD4) - (RCID CLINIC ONLY)
CD4 % Helper T Cell: 30 — ABNORMAL LOW
CD4 T Cell Abs: 710

## 2010-11-06 ENCOUNTER — Other Ambulatory Visit: Payer: Self-pay

## 2010-11-20 ENCOUNTER — Ambulatory Visit: Payer: Self-pay | Admitting: Internal Medicine

## 2010-11-21 ENCOUNTER — Other Ambulatory Visit: Payer: Self-pay

## 2010-12-04 ENCOUNTER — Ambulatory Visit: Payer: Self-pay | Admitting: Internal Medicine

## 2010-12-17 ENCOUNTER — Other Ambulatory Visit: Payer: Self-pay | Admitting: *Deleted

## 2010-12-17 DIAGNOSIS — B20 Human immunodeficiency virus [HIV] disease: Secondary | ICD-10-CM

## 2010-12-17 MED ORDER — EFAVIRENZ-EMTRICITAB-TENOFOVIR 600-200-300 MG PO TABS: 1.0000 | ORAL_TABLET | Freq: Every day | ORAL | Status: DC

## 2010-12-27 ENCOUNTER — Other Ambulatory Visit: Payer: Self-pay | Admitting: Internal Medicine

## 2010-12-27 ENCOUNTER — Other Ambulatory Visit (INDEPENDENT_AMBULATORY_CARE_PROVIDER_SITE_OTHER): Payer: Worker's Compensation

## 2010-12-27 DIAGNOSIS — B20 Human immunodeficiency virus [HIV] disease: Secondary | ICD-10-CM

## 2010-12-28 LAB — T-HELPER CELL (CD4) - (RCID CLINIC ONLY): CD4 T Cell Abs: 1390 uL (ref 400–2700)

## 2011-01-02 LAB — HIV-1 RNA QUANT-NO REFLEX-BLD: HIV 1 RNA Quant: 73 copies/mL — ABNORMAL HIGH (ref <20)

## 2011-02-12 ENCOUNTER — Encounter (HOSPITAL_COMMUNITY): Payer: Self-pay | Admitting: *Deleted

## 2011-02-12 ENCOUNTER — Emergency Department (INDEPENDENT_AMBULATORY_CARE_PROVIDER_SITE_OTHER): Payer: Worker's Compensation

## 2011-02-12 ENCOUNTER — Emergency Department (INDEPENDENT_AMBULATORY_CARE_PROVIDER_SITE_OTHER)
Admission: EM | Admit: 2011-02-12 | Discharge: 2011-02-12 | Disposition: A | Discharge status: Home / Self Care | Payer: Worker's Compensation | Source: Home / Self Care | Attending: Emergency Medicine | Admitting: Emergency Medicine

## 2011-02-12 DIAGNOSIS — S8000XA Contusion of unspecified knee, initial encounter: Secondary | ICD-10-CM

## 2011-02-12 DIAGNOSIS — B1710 Acute hepatitis C without hepatic coma: Secondary | ICD-10-CM

## 2011-02-12 DIAGNOSIS — I10 Essential (primary) hypertension: Secondary | ICD-10-CM

## 2011-02-12 DIAGNOSIS — M25519 Pain in unspecified shoulder: Secondary | ICD-10-CM

## 2011-02-12 HISTORY — DX: Human immunodeficiency virus (HIV) disease: B20

## 2011-02-12 HISTORY — DX: Asymptomatic human immunodeficiency virus (hiv) infection status: Z21

## 2011-02-12 HISTORY — DX: Essential (primary) hypertension: I10

## 2011-02-12 MED ORDER — MELOXICAM 7.5 MG PO TABS: 7.5000 mg | ORAL_TABLET | Freq: Every day | ORAL | Status: DC

## 2011-02-12 NOTE — ED Provider Notes (Signed)
History     CSN: [id number]  Arrival date & time 02/12/11  1710   First MD Initiated Contact with Patient 02/12/11 1728      Chief Complaint  Patient presents with  . Knee Pain    (Consider location/radiation/quality/duration/timing/severity/associated sxs/prior treatment) HPI Comments: ABOUT 4 DAYS AGO HIT MY KNEE AGAINST A PLASTIC OBJECT AT WORK, BEEN WALKING WITH A LIMP SINCE THEN".  Patient is a 51 y.o. female presenting with knee pain. The history is provided by the patient.  Knee Pain This is a new problem. The problem occurs constantly. Pertinent negatives include no headaches and no shortness of breath. The symptoms are aggravated by walking and standing. The symptoms are relieved by nothing. She has tried nothing for the symptoms. The treatment provided no relief.    Past Medical History  Diagnosis Date  . HIV (human immunodeficiency virus infection)   . Hypertension   . Asthma     Past Surgical History  Procedure Date  . Abdominal hysterectomy     Family History  Problem Relation Age of Onset  . Coronary artery disease Mother   . Leukemia Father     History  Substance Use Topics  . Smoking status: Current Everyday Smoker -- 0.5 packs/day for 40 years    Types: Cigarettes  . Smokeless tobacco: Never Used  . Alcohol Use: No    OB History    Grav Para Term Preterm Abortions TAB SAB Ect Mult Living                  Review of Systems  Constitutional: Negative for fever and fatigue.  Respiratory: Negative for shortness of breath.   Musculoskeletal: Negative for joint swelling.  Skin: Negative for rash and wound.  Neurological: Negative for headaches.    Allergies  Review of patient's allergies indicates no known allergies.  Home Medications   Current Outpatient Rx  Name Route Sig Dispense Refill  . EFAVIRENZ-EMTRICITAB-TENOFOVIR 600-200-300 MG PO TABS Oral Take 1 tablet by mouth at bedtime. 30 tablet 6  . HYDROCHLOROTHIAZIDE 25 MG PO TABS Oral  Take 1 tablet (25 mg total) by mouth daily. 90 tablet 3  . QUETIAPINE FUMARATE 100 MG PO TABS Oral Take 100 mg by mouth at bedtime.      . ALBUTEROL SULFATE HFA 108 (90 BASE) MCG/ACT IN AERS Inhalation Inhale 2 puffs into the lungs every 4 (four) hours as needed.      . AEROCHAMBER PLUS MISC Other 1 each by Other route once. Use as instructed       BP 178/92  Pulse 72  Temp(Src) 98.5 F (36.9 C) (Oral)  Resp 18  SpO2 98%  Physical Exam  Nursing note and vitals reviewed. Constitutional: She appears well-developed and well-nourished. No distress.  Musculoskeletal: She exhibits tenderness. She exhibits no edema.       Left knee: She exhibits normal range of motion, no swelling, no effusion, no ecchymosis, no deformity and no erythema. tenderness found. Lateral joint line tenderness noted.       Legs: Neurological: She is alert.  Skin: Skin is warm. No rash noted.    ED Course  Procedures (including critical care time)  Labs Reviewed - No data to display No results found.   No diagnosis found.    MDM  L KNEE CONTUSION-STABLE KNEE- NO CLINICAL EFFUSION         Jimmie Molly, MD 02/12/11 [phone]

## 2011-02-12 NOTE — ED Notes (Signed)
4th day left knee pain after hitting it against a hard plastic object at work.  She has walked with a limp since then.  No obvious injury noted

## 2011-02-19 ENCOUNTER — Other Ambulatory Visit: Payer: Self-pay | Admitting: *Deleted

## 2011-02-19 ENCOUNTER — Telehealth: Payer: Self-pay | Admitting: *Deleted

## 2011-02-19 DIAGNOSIS — I10 Essential (primary) hypertension: Secondary | ICD-10-CM

## 2011-02-19 MED ORDER — HYDROCHLOROTHIAZIDE 25 MG PO TABS: 25.0000 mg | ORAL_TABLET | Freq: Every day | ORAL | Status: DC

## 2011-02-19 NOTE — Telephone Encounter (Signed)
Patient walked into clinic c/o headache and felt like B/P was elevated. Stated she had been off B/P med x 1 year because she could not afford it.  Call made to Dr. Orvan Falconer and he okd filling her HCTZ.  Sent to [employer] and they will waive the $4.00 cost.  Patient has appointment with Dr. Orvan Falconer on 02/25/11. Wendall Mola CMA

## 2011-02-20 ENCOUNTER — Encounter: Payer: Self-pay | Admitting: Licensed Clinical Social Worker

## 2011-02-20 ENCOUNTER — Telehealth: Payer: Self-pay | Admitting: Licensed Clinical Social Worker

## 2011-02-20 ENCOUNTER — Encounter: Payer: Self-pay | Admitting: Infectious Disease

## 2011-02-20 ENCOUNTER — Other Ambulatory Visit: Payer: Self-pay | Admitting: Infectious Disease

## 2011-02-20 ENCOUNTER — Telehealth: Payer: Self-pay | Admitting: *Deleted

## 2011-02-20 ENCOUNTER — Ambulatory Visit (HOSPITAL_COMMUNITY)
Admission: RE | Admit: 2011-02-20 | Discharge: 2011-02-20 | Disposition: A | Discharge status: Home / Self Care | Payer: Worker's Compensation | Source: Ambulatory Visit | Attending: Infectious Disease | Admitting: Infectious Disease

## 2011-02-20 ENCOUNTER — Other Ambulatory Visit: Payer: Self-pay

## 2011-02-20 ENCOUNTER — Ambulatory Visit (INDEPENDENT_AMBULATORY_CARE_PROVIDER_SITE_OTHER): Payer: Worker's Compensation | Admitting: Infectious Disease

## 2011-02-20 DIAGNOSIS — I10 Essential (primary) hypertension: Secondary | ICD-10-CM

## 2011-02-20 DIAGNOSIS — R200 Anesthesia of skin: Secondary | ICD-10-CM

## 2011-02-20 DIAGNOSIS — B1710 Acute hepatitis C without hepatic coma: Secondary | ICD-10-CM

## 2011-02-20 DIAGNOSIS — I498 Other specified cardiac arrhythmias: Secondary | ICD-10-CM | POA: Insufficient documentation

## 2011-02-20 DIAGNOSIS — B20 Human immunodeficiency virus [HIV] disease: Secondary | ICD-10-CM

## 2011-02-20 DIAGNOSIS — M25519 Pain in unspecified shoulder: Secondary | ICD-10-CM

## 2011-02-20 DIAGNOSIS — I160 Hypertensive urgency: Secondary | ICD-10-CM

## 2011-02-20 LAB — CBC WITH DIFFERENTIAL/PLATELET
Basophils Absolute: 0 10*3/uL (ref 0.0–0.1)
Basophils Relative: 0 % (ref 0–1)
Eosinophils Absolute: 0.1 10*3/uL (ref 0.0–0.7)
MCH: 33.9 pg (ref 26.0–34.0)
MCHC: 34.9 g/dL (ref 30.0–36.0)
Neutrophils Relative %: 40 % — ABNORMAL LOW (ref 43–77)
Platelets: 152 10*3/uL (ref 150–400)

## 2011-02-20 LAB — DRUG SCREEN, URINE
Barbiturate Quant, Ur: NEGATIVE
Cocaine Metabolites: NEGATIVE
Creatinine,U: 20.16 mg/dL
Methadone: NEGATIVE
Opiates: NEGATIVE

## 2011-02-20 LAB — COMPLETE METABOLIC PANEL WITH GFR
ALT: 9 U/L (ref 0–35)
CO2: 28 mEq/L (ref 19–32)
Calcium: 9.7 mg/dL (ref 8.4–10.5)
Chloride: 102 mEq/L (ref 96–112)
Creat: 0.62 mg/dL (ref 0.50–1.10)
GFR, Est African American: >89 mL/min
Glucose, Bld: 88 mg/dL (ref 70–99)
Sodium: 140 mEq/L (ref 135–145)
Total Protein: 7.7 g/dL (ref 6.0–8.3)

## 2011-02-20 LAB — TROPONIN I: Troponin I: 0.3 ng/mL — ABNORMAL HIGH (ref <0.30)

## 2011-02-20 MED ORDER — LISINOPRIL 20 MG PO TABS: 20.0000 mg | ORAL_TABLET | Freq: Every day | ORAL | Status: DC

## 2011-02-20 MED ORDER — CLONIDINE HCL 0.1 MG PO TABS
0.2000 mg | ORAL_TABLET | Freq: Once | ORAL | Status: AC
  Administered 2011-02-20: 0.2 mg via ORAL

## 2011-02-20 NOTE — Progress Notes (Signed)
Subjective:    Patient ID: Daisy Salinas, female    DOB: 30-Jan-1960, 51 y.o.   MRN: [id number]  HPI   Daisy Salinas is a 51 y.o. female who is  with reasonable  viral load  In 20s and health cd4 count. Patient has noticed severe headaches frontal 6-7/10, no blurry vision, some this am. Came to clinic with headache, knee pain. Dr. Orvan Falconer rx in her HCTZ which she started. Since then came to clinic again today due to concerns for BP still high. Still headache this am. She is also complaining of pain in right shoulder and tingling in her right hand, that was also present in left hand. She apparently admitted to taking cocaine to [facility] but said to me she had been clean for amonth. She denies chest pain or pressure. On palpatoin of both shoulders I could reproduce some fo the pain. There were no visible deformities. rom was normal. I spent greater than 45 minutes with the patient including greater than 50% of time in face to face counsel of the patient and in coordination of their care.    Review of Systems  Constitutional: Negative for fever, chills, diaphoresis, activity change, appetite change, fatigue and unexpected weight change.  HENT: Negative for congestion, sore throat, rhinorrhea, sneezing, trouble swallowing and sinus pressure.   Eyes: Negative for photophobia and visual disturbance.  Respiratory: Negative for cough, chest tightness, shortness of breath, wheezing and stridor.   Cardiovascular: Negative for chest pain, palpitations and leg swelling.  Gastrointestinal: Negative for nausea, vomiting, abdominal pain, diarrhea, constipation, blood in stool, abdominal distention and anal bleeding.  Genitourinary: Negative for dysuria, hematuria, flank pain and difficulty urinating.  Musculoskeletal: Positive for myalgias, joint swelling and arthralgias. Negative for back pain and gait problem.  Skin: Negative for color change, pallor, rash and wound.  Neurological: Positive for headaches.  Negative for dizziness, tremors, weakness and light-headedness.  Hematological: Negative for adenopathy. Does not bruise/bleed easily.  Psychiatric/Behavioral: Negative for behavioral problems, confusion, sleep disturbance, dysphoric mood, decreased concentration and agitation.       Objective:   Physical Exam  Constitutional: She is oriented to person, place, and time. She appears well-developed and well-nourished. No distress.  HENT:  Head: Normocephalic and atraumatic.  Mouth/Throat: Oropharynx is clear and moist. No oropharyngeal exudate.  Eyes: Conjunctivae and EOM are normal. Pupils are equal, round, and reactive to light. No scleral icterus.  Neck: Normal range of motion. Neck supple. No JVD present.  Cardiovascular: Normal rate, regular rhythm and normal heart sounds.  Exam reveals no gallop and no friction rub.   No murmur heard. Pulmonary/Chest: Effort normal and breath sounds normal. No respiratory distress. She has no wheezes. She has no rales. She exhibits no tenderness.       Some soreness with palpation of chest wall  Abdominal: She exhibits no distension and no mass. There is no tenderness. There is no rebound and no guarding.  Musculoskeletal: She exhibits no edema and no tenderness.       Right shoulder: She exhibits tenderness and pain. She exhibits normal range of motion, no bony tenderness, no effusion, no crepitus, no deformity and no spasm.       Left shoulder: She exhibits tenderness. She exhibits normal range of motion, no bony tenderness, no swelling, no effusion, no crepitus, no deformity, no laceration, no pain, no spasm, normal pulse and normal strength.  Lymphadenopathy:    She has no cervical adenopathy.  Neurological: She is alert and oriented to person,  place, and time. She has normal reflexes. She exhibits normal muscle tone. Coordination normal.  Skin: Skin is warm and dry. She is not diaphoretic. No erythema. No pallor.  Psychiatric: She has a normal mood  and affect. Her behavior is normal. Judgment and thought content normal.          Assessment & Plan:  HYPERTENSION bp improved after several doses of clonidine. EKG was normal. Stat cardiac enzymes are pending. I am [facility] I want her back here with her pill bottles for repeat bmp check and BP check in aweek  Hypertensive urgency See above plan  Shoulder pain Sounds musculoskeletal. WIll check cardiac enzymes in case this is odd manifestation of angina from cocaine or underlying CAD EKG normal  HEPATITIS C Check hep c viral load  HIV INFECTION Reasonable control. Check labs in next few weeks

## 2011-02-20 NOTE — Telephone Encounter (Signed)
[facility] called to report the urine drug screen was negative & creatinine was 20.16. Reported to Dr. Daiva Eves.

## 2011-02-20 NOTE — Assessment & Plan Note (Signed)
See above plan. 

## 2011-02-20 NOTE — Telephone Encounter (Signed)
Completed referral to [facility], [employer].  Pt escorted to Ms. Branch's office to arrange appt.  Pt given [employer] business card with appropriate phone numbers.

## 2011-02-20 NOTE — Telephone Encounter (Signed)
She walked in c/o severe HA. 7/10. Neck hurts & feels tingling. LA 193/115 & p 72  RA 213/115 & p 70. LA feels numb & "like a pulled muscle" states last cocaine was "almost 30 days ago". Retook bps. Still elevated. rec'd order from Dr. Daiva Eves to give 0.2mg  clonidine. Given.  She is with Denyce Robert at this time. Placed on schedule to be seen by Dr. Zenaida Niece dam

## 2011-02-20 NOTE — Assessment & Plan Note (Signed)
bp improved after several doses of clonidine. EKG was normal. Stat cardiac enzymes are pending. I am sending rx for lisinopril 20mg  [facility]. I want her back here with her pill bottles for repeat bmp check and BP check in aweek

## 2011-02-20 NOTE — Assessment & Plan Note (Signed)
Sounds musculoskeletal. WIll check cardiac enzymes in case this is odd manifestation of angina from cocaine or underlying CAD EKG normal

## 2011-02-20 NOTE — Assessment & Plan Note (Signed)
Reasonable control. Check labs in next few weeks

## 2011-02-20 NOTE — Telephone Encounter (Signed)
I called patient to let her know that she needs a repeat troponin test but her phone was disconnected.

## 2011-02-20 NOTE — Telephone Encounter (Signed)
[facility] called with critical lab, and faxed over results.  Results given to Dr. Daiva Eves. Wendall Mola CMA

## 2011-02-20 NOTE — Assessment & Plan Note (Signed)
Check hep c viral load 

## 2011-02-20 NOTE — Patient Instructions (Signed)
rtc in one week for repeat blood pressure check and bmp

## 2011-02-21 ENCOUNTER — Emergency Department (HOSPITAL_COMMUNITY): Payer: Self-pay

## 2011-02-21 ENCOUNTER — Encounter (HOSPITAL_COMMUNITY): Payer: Self-pay | Admitting: Emergency Medicine

## 2011-02-21 ENCOUNTER — Inpatient Hospital Stay (HOSPITAL_COMMUNITY)
Admission: EM | Admit: 2011-02-21 | Discharge: 2011-02-22 | DRG: 313 | Disposition: A | Discharge status: Home / Self Care | Payer: Self-pay | Source: Ambulatory Visit | Attending: Internal Medicine | Admitting: Internal Medicine

## 2011-02-21 ENCOUNTER — Other Ambulatory Visit: Payer: Self-pay

## 2011-02-21 ENCOUNTER — Other Ambulatory Visit: Payer: Worker's Compensation

## 2011-02-21 ENCOUNTER — Telehealth: Payer: Self-pay | Admitting: *Deleted

## 2011-02-21 DIAGNOSIS — Z21 Asymptomatic human immunodeficiency virus [HIV] infection status: Secondary | ICD-10-CM | POA: Diagnosis present

## 2011-02-21 DIAGNOSIS — R200 Anesthesia of skin: Secondary | ICD-10-CM

## 2011-02-21 DIAGNOSIS — F32A Depression, unspecified: Secondary | ICD-10-CM | POA: Diagnosis present

## 2011-02-21 DIAGNOSIS — IMO0001 Reserved for inherently not codable concepts without codable children: Secondary | ICD-10-CM | POA: Diagnosis present

## 2011-02-21 DIAGNOSIS — B1920 Unspecified viral hepatitis C without hepatic coma: Secondary | ICD-10-CM | POA: Diagnosis present

## 2011-02-21 DIAGNOSIS — B20 Human immunodeficiency virus [HIV] disease: Secondary | ICD-10-CM

## 2011-02-21 DIAGNOSIS — F17200 Nicotine dependence, unspecified, uncomplicated: Secondary | ICD-10-CM | POA: Diagnosis present

## 2011-02-21 DIAGNOSIS — Z9119 Patient's noncompliance with other medical treatment and regimen: Secondary | ICD-10-CM

## 2011-02-21 DIAGNOSIS — E876 Hypokalemia: Secondary | ICD-10-CM | POA: Diagnosis present

## 2011-02-21 DIAGNOSIS — R079 Chest pain, unspecified: Secondary | ICD-10-CM

## 2011-02-21 DIAGNOSIS — F3289 Other specified depressive episodes: Secondary | ICD-10-CM | POA: Diagnosis present

## 2011-02-21 DIAGNOSIS — F329 Major depressive disorder, single episode, unspecified: Secondary | ICD-10-CM | POA: Diagnosis present

## 2011-02-21 DIAGNOSIS — R0789 Other chest pain: Principal | ICD-10-CM | POA: Diagnosis present

## 2011-02-21 DIAGNOSIS — F1410 Cocaine abuse, uncomplicated: Secondary | ICD-10-CM | POA: Diagnosis present

## 2011-02-21 DIAGNOSIS — E785 Hyperlipidemia, unspecified: Secondary | ICD-10-CM | POA: Diagnosis present

## 2011-02-21 DIAGNOSIS — Z91199 Patient's noncompliance with other medical treatment and regimen due to unspecified reason: Secondary | ICD-10-CM

## 2011-02-21 DIAGNOSIS — I10 Essential (primary) hypertension: Secondary | ICD-10-CM | POA: Diagnosis present

## 2011-02-21 HISTORY — DX: Urinary tract infection, site not specified: N39.0

## 2011-02-21 HISTORY — DX: Tuberculosis of lung: A15.0

## 2011-02-21 HISTORY — DX: Bipolar disorder, unspecified: F31.9

## 2011-02-21 HISTORY — DX: Gastro-esophageal reflux disease without esophagitis: K21.9

## 2011-02-21 HISTORY — DX: Depression, unspecified: F32.A

## 2011-02-21 HISTORY — DX: Major depressive disorder, single episode, unspecified: F32.9

## 2011-02-21 LAB — RAPID URINE DRUG SCREEN, HOSP PERFORMED
Barbiturates: NOT DETECTED
Benzodiazepines: NOT DETECTED

## 2011-02-21 LAB — BASIC METABOLIC PANEL
BUN: 15 mg/dL (ref 6–23)
Chloride: 101 mEq/L (ref 96–112)
Creatinine, Ser: 0.64 mg/dL (ref 0.50–1.10)
GFR calc Af Amer: >90 mL/min (ref >90)
GFR calc non Af Amer: >90 mL/min (ref >90)

## 2011-02-21 LAB — CBC
HCT: 38.4 % (ref 36.0–46.0)
MCHC: 36.7 g/dL — ABNORMAL HIGH (ref 30.0–36.0)
MCV: 93.7 fL (ref 78.0–100.0)
RDW: 12.3 % (ref 11.5–15.5)

## 2011-02-21 LAB — HEPATIC FUNCTION PANEL
ALT: 15 U/L (ref 0–35)
Indirect Bilirubin: 0.3 mg/dL (ref 0.3–0.9)
Total Protein: 7.4 g/dL (ref 6.0–8.3)

## 2011-02-21 MED ORDER — ONDANSETRON HCL 4 MG/2ML IJ SOLN
INTRAMUSCULAR | Status: AC
  Filled 2011-02-21: qty 2

## 2011-02-21 NOTE — ED Notes (Signed)
ntg 1 sl midsternal  C/p 4/10

## 2011-02-21 NOTE — Telephone Encounter (Signed)
She came in & got her bp re-checked, L arm 169/105 & p 64                                                                R arm 171/94 & p 60 She is taking her meds. Advised staying away from using salt in her diet & gave examples  Sent to lab to labs md requested

## 2011-02-21 NOTE — ED Notes (Signed)
Report received from Nikki RN

## 2011-02-21 NOTE — ED Notes (Signed)
Pt placed on cardiac monitor 

## 2011-02-21 NOTE — Telephone Encounter (Signed)
I tried all numbers in the chart (3). None were in working order. She has an appt here monday

## 2011-02-21 NOTE — ED Notes (Signed)
midsternal c/p pain that started this am 0700. States pain was intermittant until the last 4 hrs and then became constant . Pt states the neck pain started,sob and nausea.pt was given 1 ntg in route and zofran. Pt states pain is 1/10. Pt also states  with r arm numbness.

## 2011-02-21 NOTE — ED Provider Notes (Signed)
History     CSN: [id number]  Arrival date & time 02/21/11  2038   First MD Initiated Contact with Patient 02/21/11 2048      Chief Complaint  Patient presents with  . Chest Pain    (Consider location/radiation/quality/duration/timing/severity/associated sxs/prior treatment) Patient is a 51 y.o. female presenting with chest pain. The history is provided by the patient.  Chest Pain Episode onset: yesterday. Chest pain occurs constantly. The chest pain is improving. Associated with: not associated with any particular activity. At its most intense, the pain is at 8/10. The pain is currently at 1/10. The quality of the pain is described as aching. The pain radiates to the right jaw, left jaw, left neck and right neck (right shoulder). Exacerbated by: not pleuritic or exertional. Primary symptoms include shortness of breath and nausea. Pertinent negatives for primary symptoms include no fever, no fatigue, no cough, no wheezing, no palpitations, no abdominal pain, no vomiting and no dizziness.  Associated symptoms include diaphoresis.  Pertinent negatives for associated symptoms include no claudication, no lower extremity edema, no numbness, no orthopnea and no weakness. She tried nitroglycerin and aspirin for the symptoms.  Her past medical history is significant for hypertension.  Pertinent negatives for past medical history include no seizures. Past medical history comments: HIV, hepatitis C     Past Medical History  Diagnosis Date  . HIV (human immunodeficiency virus infection)   . Hypertension   . Asthma     Past Surgical History  Procedure Date  . Abdominal hysterectomy     Family History  Problem Relation Age of Onset  . Coronary artery disease Mother   . Leukemia Father     History  Substance Use Topics  . Smoking status: Current Everyday Smoker -- 0.5 packs/day for 40 years    Types: Cigarettes  . Smokeless tobacco: Never Used  . Alcohol Use: No    OB History    Grav Para Term Preterm Abortions TAB SAB Ect Mult Living                  Review of Systems  Constitutional: Positive for diaphoresis. Negative for fever, chills, activity change, appetite change and fatigue.  HENT: Negative for congestion, sore throat, rhinorrhea, neck pain and neck stiffness.   Eyes: Negative for photophobia, redness and visual disturbance.  Respiratory: Positive for shortness of breath. Negative for cough and wheezing.   Cardiovascular: Positive for chest pain. Negative for palpitations, orthopnea, claudication and leg swelling.  Gastrointestinal: Positive for nausea. Negative for vomiting, abdominal pain, diarrhea, constipation and blood in stool.  Genitourinary: Negative for dysuria, urgency, hematuria and flank pain.  Musculoskeletal: Negative for back pain.  Skin: Negative for rash and wound.  Neurological: Negative for dizziness, seizures, facial asymmetry, speech difficulty, weakness, light-headedness, numbness and headaches.  Psychiatric/Behavioral: Negative for confusion.  All other systems reviewed and are negative.    Allergies  Review of patient's allergies indicates no known allergies.  Home Medications   Current Outpatient Rx  Name Route Sig Dispense Refill  . ALBUTEROL SULFATE HFA 108 (90 BASE) MCG/ACT IN AERS Inhalation Inhale 2 puffs into the lungs every 4 (four) hours as needed.      . EFAVIRENZ-EMTRICITAB-TENOFOVIR 600-200-300 MG PO TABS Oral Take 1 tablet by mouth at bedtime. 30 tablet 6  . HYDROCHLOROTHIAZIDE 25 MG PO TABS Oral Take 1 tablet (25 mg total) by mouth daily. 30 tablet 0  . LISINOPRIL 20 MG PO TABS Oral Take 1 tablet (20 mg  total) by mouth daily. 30 tablet 11  . MELOXICAM 7.5 MG PO TABS Oral Take 1 tablet (7.5 mg total) by mouth daily. 14 tablet 0  . QUETIAPINE FUMARATE 100 MG PO TABS Oral Take 100 mg by mouth at bedtime.      . AEROCHAMBER PLUS MISC Other 1 each by Other route once. Use as instructed       BP 110/85  Pulse 69   Temp(Src) 98.2 F (36.8 C) (Oral)  Resp 16  SpO2 97%  Physical Exam  Nursing note and vitals reviewed. Constitutional: She is oriented to person, place, and time. She appears well-developed and well-nourished.  Non-toxic appearance. No distress.  HENT:  Head: Normocephalic and atraumatic.  Mouth/Throat: Oropharynx is clear and moist.  Eyes: Conjunctivae and EOM are normal. Pupils are equal, round, and reactive to light. No scleral icterus.  Neck: Normal range of motion. Neck supple. No JVD present.  Cardiovascular: Normal rate, regular rhythm, normal heart sounds and intact distal pulses.   No murmur heard. Pulmonary/Chest: Effort normal and breath sounds normal. No respiratory distress. She has no wheezes. She has no rales.  Abdominal: Soft. Bowel sounds are normal. She exhibits no distension. There is no tenderness. There is no rebound and no guarding.  Musculoskeletal: Normal range of motion.  Neurological: She is alert and oriented to person, place, and time. She has normal strength. No cranial nerve deficit. GCS eye subscore is 4. GCS verbal subscore is 5. GCS motor subscore is 6.  Skin: Skin is warm and dry. No rash noted. She is not diaphoretic.  Psychiatric: She has a normal mood and affect.    ED Course  Procedures (including critical care time)   Date: 02/21/2011  Rate: 67  Rhythm: normal sinus rhythm  QRS Axis: normal  Intervals: QT prolonged  ST/T Wave abnormalities: normal and nonspecific T wave changes  Conduction Disutrbances:none  Narrative Interpretation: Twave inversions in V2-V4  Old EKG Reviewed: none available   Labs Reviewed  CBC - Abnormal; Notable for the following:    MCH 34.4 (*)    MCHC 36.7 (*)    Platelets 140 (*)    All other components within normal limits  BASIC METABOLIC PANEL - Abnormal; Notable for the following:    Potassium 3.0 (*)    Glucose, Bld 129 (*)    All other components within normal limits  HEPATIC FUNCTION PANEL -  Abnormal; Notable for the following:    AST 64 (*)    Alkaline Phosphatase 127 (*)    All other components within normal limits  TROPONIN I  URINE RAPID DRUG SCREEN (HOSP PERFORMED)   Dg Chest 2 View  02/21/2011  *RADIOLOGY REPORT*  Clinical Data: Chest pain  CHEST - 2 VIEW  Comparison: 10/08/2004  Findings: Lungs clear.  Heart size and pulmonary vascularity normal.  No effusion.  Visualized bones unremarkable.  IMPRESSION: No acute disease  Original Report Authenticated By: Thora Lance III, M.D.     1. Chest pain       MDM  50yo AAF with PMH significant for HTN, HIV, and hepatitis C who presents to the ED due to chest pain. Onset yesterday. Seen in ID clinic had elevated troponin at that time. Today had worsening pain with dyspnea and diaphoresis and nausea. EMS gave her ASA, NTG, and zofran in route. Her pain went from 8/10 to 1/10 she is feeling much better now and in no acute distress. Lungs clear. Legs without edema or tenderness. No  hx cough or fever. EKG with TWIs anteriorly. Will workup for ACS. Doubt PE or dissection.   Trop neg. Pain returned. Given nitro.   Pain improved. Medicine consulted for admission.         Verne Carrow, MD 02/22/11 [id number]

## 2011-02-22 ENCOUNTER — Encounter (HOSPITAL_COMMUNITY): Payer: Self-pay | Admitting: General Practice

## 2011-02-22 ENCOUNTER — Other Ambulatory Visit: Payer: Self-pay

## 2011-02-22 DIAGNOSIS — I10 Essential (primary) hypertension: Secondary | ICD-10-CM

## 2011-02-22 DIAGNOSIS — R079 Chest pain, unspecified: Secondary | ICD-10-CM

## 2011-02-22 DIAGNOSIS — B20 Human immunodeficiency virus [HIV] disease: Secondary | ICD-10-CM

## 2011-02-22 LAB — MAGNESIUM: Magnesium: 2.2 mg/dL (ref 1.5–2.5)

## 2011-02-22 LAB — CARDIAC PANEL(CRET KIN+CKTOT+MB+TROPI)
Relative Index: 2 (ref 0.0–2.5)
Relative Index: 2.1 (ref 0.0–2.5)
Total CK: 123 U/L (ref 7–177)
Troponin I: <0.3 ng/mL (ref <0.30)

## 2011-02-22 LAB — BASIC METABOLIC PANEL
BUN: 17 mg/dL (ref 6–23)
Chloride: 106 mEq/L (ref 96–112)
GFR calc Af Amer: >90 mL/min (ref >90)
Glucose, Bld: 103 mg/dL — ABNORMAL HIGH (ref 70–99)
Potassium: 4.3 mEq/L (ref 3.5–5.1)

## 2011-02-22 LAB — CK TOTAL AND CKMB (NOT AT ARMC): Total CK: 115 U/L (ref 7–177)

## 2011-02-22 MED ORDER — ONDANSETRON HCL 4 MG/2ML IJ SOLN: 4.0000 mg | Freq: Four times a day (QID) | INTRAMUSCULAR | Status: DC | PRN

## 2011-02-22 MED ORDER — ACETAMINOPHEN 325 MG PO TABS: 650.0000 mg | ORAL_TABLET | Freq: Four times a day (QID) | ORAL | Status: DC | PRN

## 2011-02-22 MED ORDER — HYDROCHLOROTHIAZIDE 25 MG PO TABS
25.0000 mg | ORAL_TABLET | Freq: Every day | ORAL | Status: DC
  Administered 2011-02-22: 25 mg via ORAL
  Filled 2011-02-22: qty 1

## 2011-02-22 MED ORDER — POTASSIUM CHLORIDE CRYS ER 20 MEQ PO TBCR: 20.0000 meq | EXTENDED_RELEASE_TABLET | Freq: Every day | ORAL | Status: DC

## 2011-02-22 MED ORDER — MELOXICAM 7.5 MG PO TABS
7.5000 mg | ORAL_TABLET | Freq: Two times a day (BID) | ORAL | Status: DC
  Administered 2011-02-22 (×2): 7.5 mg via ORAL
  Filled 2011-02-22 (×3): qty 1

## 2011-02-22 MED ORDER — ZOLPIDEM TARTRATE 5 MG PO TABS
10.0000 mg | ORAL_TABLET | Freq: Every evening | ORAL | Status: DC | PRN
  Administered 2011-02-22: 10 mg via ORAL
  Filled 2011-02-22: qty 2

## 2011-02-22 MED ORDER — NITROGLYCERIN 0.4 MG SL SUBL: 0.4000 mg | SUBLINGUAL_TABLET | SUBLINGUAL | Status: DC | PRN

## 2011-02-22 MED ORDER — ONDANSETRON HCL 4 MG PO TABS: 4.0000 mg | ORAL_TABLET | Freq: Four times a day (QID) | ORAL | Status: DC | PRN

## 2011-02-22 MED ORDER — POTASSIUM CHLORIDE CRYS ER 20 MEQ PO TBCR
40.0000 meq | EXTENDED_RELEASE_TABLET | Freq: Two times a day (BID) | ORAL | Status: DC
  Administered 2011-02-22 (×2): 40 meq via ORAL
  Filled 2011-02-22 (×3): qty 2

## 2011-02-22 MED ORDER — ASPIRIN EC 81 MG PO TBEC
81.0000 mg | DELAYED_RELEASE_TABLET | Freq: Every day | ORAL | Status: DC
  Administered 2011-02-22: 81 mg via ORAL
  Filled 2011-02-22: qty 1

## 2011-02-22 MED ORDER — ACETAMINOPHEN 650 MG RE SUPP: 650.0000 mg | Freq: Four times a day (QID) | RECTAL | Status: DC | PRN

## 2011-02-22 MED ORDER — QUETIAPINE FUMARATE 100 MG PO TABS
100.0000 mg | ORAL_TABLET | Freq: Every day | ORAL | Status: DC
  Administered 2011-02-22: 100 mg via ORAL
  Filled 2011-02-22 (×2): qty 1

## 2011-02-22 MED ORDER — HEPARIN SODIUM (PORCINE) 5000 UNIT/ML IJ SOLN
5000.0000 [IU] | Freq: Three times a day (TID) | INTRAMUSCULAR | Status: DC
  Administered 2011-02-22: 5000 [IU] via SUBCUTANEOUS
  Filled 2011-02-22 (×4): qty 1

## 2011-02-22 MED ORDER — EFAVIRENZ-EMTRICITAB-TENOFOVIR 600-200-300 MG PO TABS
1.0000 | ORAL_TABLET | Freq: Every day | ORAL | Status: DC
  Administered 2011-02-22: 1 via ORAL
  Filled 2011-02-22 (×2): qty 1

## 2011-02-22 NOTE — Plan of Care (Signed)
Problem: Phase I Progression Outcomes Goal: Aspirin unless contraindicated Outcome: Completed/Met Date Met:  02/22/11 Given by EMS

## 2011-02-22 NOTE — H&P (Signed)
Hospital Admission Note Date: 02/22/2011  Patient name: Daisy Salinas Medical record number: [id number] Date of birth: 20-Feb-1960 Age: 51 y.o. Gender: female PCP: No primary provider on file.  Medical Service: Sharen Heck  Attending physician:   Dr. Josem Kaufmann  1st Contact:   Dr. Bosie Clos [phone] 2nd Contact:   Dr. Tonny Branch Pager:[phone] After 5 pm or weekends: 1st Contact:      Pager: [phone] 2nd Contact:      Pager: [phone]  Chief Complaint: Chest pain  History of Present Illness: The patient is a 51 YO woman who is a patient of the ID clinic who presents with chest pain. Yesterday she presented to the ID clinic and had high blood pressure of 190s/110s and was treated in the office with several doses of clonidine. Her blood pressure was reduced at the time she left the clinic. She had several blood tests drawn at the clinic that resulted after she left including a troponin of 0.30. They did try to call her back but no numbers listed worked. This morning she started having some chest discomfort that felt like a squeezing sensation. It was intermittent and came and went. No alleviating or exacerbating factors. She was also having diaphoresis at the time it started although she said she gets hot flashes and thought that was what that was. She was also having some episodic SOB with the discomfort. She then finally called EMS after dinner and was given nitro and ASA and zofran en route to the hospital. She was then given a second dose of nitroglycerin. She states that the nitro made her have a headache and is not sure if it helped but it may have. She is currently having some chest discomfort when we see her and denies any other symptoms. She is also having some shoulder pain and does manual labor working in a kitchen. She states that this morning she took her BP med although before that it had been about 1 yr. No N/V/D/constipation. No abdominal pain, no swelling, no fever/chills. She is a half pack  per day smoker since she was 12 and does not drink. Her last cocaine use was 33 days ago. She was sober for 16 years but had a relapse 1 yr ago. PMH includes HTN, HIV+, Hep C. Last CD4 count is 1390 from 12/12.   Meds: Medications Prior to Admission  Medication Dose Route Frequency Provider Last Rate Last Dose  . cloNIDine (CATAPRES) tablet 0.2 mg  0.2 mg Oral Once Paulette Blanch Dam, MD   0.2 mg at 02/20/11 1050  . ondansetron (ZOFRAN) 4 MG/2ML injection           . zolpidem (AMBIEN) tablet 10 mg  10 mg Oral QHS PRN Almyra Deforest, MD       Medications Prior to Admission  Medication Sig Dispense Refill  . albuterol (PROVENTIL HFA) 108 (90 BASE) MCG/ACT inhaler Inhale 2 puffs into the lungs every 4 (four) hours as needed. As needed for shortness of breath.      . efavirenz-emtrictabine-tenofovir (ATRIPLA) 600-200-300 MG per tablet Take 1 tablet by mouth daily with breakfast.      . hydrochlorothiazide (HYDRODIURIL) 25 MG tablet Take 1 tablet (25 mg total) by mouth daily.  30 tablet  0  . meloxicam (MOBIC) 7.5 MG tablet Take 7.5 mg by mouth 2 (two) times daily.      . QUEtiapine (SEROQUEL) 100 MG tablet Take 100 mg by mouth at bedtime.  Allergies: Review of patient's allergies indicates no known allergies. Past Medical History  Diagnosis Date  . HIV (human immunodeficiency virus infection)   . Hypertension   . Asthma    Past Surgical History  Procedure Date  . Abdominal hysterectomy    Family History  Problem Relation Age of Onset  . Coronary artery disease Mother   . Leukemia Father    History   Social History  . Marital Status: Married    Spouse Name: N/A    Number of Children: N/A  . Years of Education: N/A   Occupational History  . Not on file.   Social History Main Topics  . Smoking status: Current Everyday Smoker -- 0.5 packs/day for 40 years    Types: Cigarettes  . Smokeless tobacco: Never Used  . Alcohol Use: No  . Drug Use: 1 per week    Special:  "Crack" cocaine  . Sexually Active: Yes -- Female partner(s)   Other Topics Concern  . Not on file   Social History Narrative  . No narrative on file    Review of Systems: Pertinent items are noted in HPI.  Physical Exam: Blood pressure 129/76, pulse 69, temperature 98.2 F (36.8 C), temperature source Oral, resp. rate 16, SpO2 97.00%. General: resting in bed HEENT: PERRL, EOMI, no scleral icterus Cardiac: RRR, no rubs, murmurs or gallops Pulm: clear to auscultation bilaterally, moving normal volumes of air Abd: soft, nontender, nondistended, BS present Ext: warm and well perfused, no pedal edema, R shoulder is tender to palpation and hurts with movement Neuro: alert and oriented X3, cranial nerves II-XII grossly intact   Lab results: Basic Metabolic Panel:  Basename 02/21/11 2103 02/20/11 1122  NA 137 140  K 3.0* 3.4*  CL 101 102  CO2 26 28  GLUCOSE 129* 88  BUN 15 12  CREATININE 0.64 0.62  CALCIUM 9.5 9.7  MG -- --  PHOS -- --   Liver Function Tests:  [facility] 02/21/11 2103 02/20/11 1122  AST 64* 29  ALT 15 9  ALKPHOS 127* 124*  BILITOT 0.6 0.3  PROT 7.4 7.7  ALBUMIN 3.7 3.7   CBC:  Basename 02/21/11 2103 02/20/11 1122  WBC 5.5 6.0  NEUTROABS -- 2.4  HGB 14.1 13.7  HCT 38.4 39.2  MCV 93.7 97.0  PLT 140* 152   Cardiac Enzymes:  Basename 02/21/11 2103 02/20/11 1122  CKTOTAL -- 124  CKMB -- 2.2  CKMBINDEX -- --  TROPONINI <0.30 0.30*   Urine Drug Screen: Drugs of Abuse     Component Value Date/Time   LABOPIA NONE DETECTED 02/21/2011 2233   COCAINSCRNUR NONE DETECTED 02/21/2011 2233   COCAINSCRNUR NEG 02/20/2011 1121   LABBENZ NONE DETECTED 02/21/2011 2233   LABBENZ NEG 02/20/2011 1121   AMPHETMU NONE DETECTED 02/21/2011 2233   AMPHETMU NEG 02/20/2011 1121   THCU NONE DETECTED 02/21/2011 2233   LABBARB NONE DETECTED 02/21/2011 2233    Imaging results:  Dg Chest 2 View  02/21/2011  *RADIOLOGY REPORT*  Clinical Data: Chest pain  CHEST - 2 VIEW   Comparison: 10/08/2004  Findings: Lungs clear.  Heart size and pulmonary vascularity normal.  No effusion.  Visualized bones unremarkable.  IMPRESSION: No acute disease  Original Report Authenticated By: Osa Craver, M.D.    Other results: EKG: normal EKG, normal sinus rhythm, there are no previous tracings available for comparison, possible prolonged QT interval. EKG done in clinic yesterday unavailable for viewing.   Assessment & Plan by Problem:  *  Chest pain - Will rule out ACS by cycling CE overnight. Will put her in telemetry and get repeat EKG in the AM. Will order nitro prn and ASA daily, zofran for nausea, O2 as needed. Will check TSH.  Shoulder pain - Likely musculoskeletal in nature and will use meloxicam at this time.    HIV INFECTION - Last CD4 count 1390in 12/12. Will continue her home meds while she is in the hospital.   Hypokalemia - K slightly low at 3.4 and will check magnesium and give some supplementation as she has been low in the past.    DYSLIPIDEMIA - Not currently on any medication and will not start at this time.    CIGARETTE SMOKER - Advised cessation. Can order nicotine patch as needed.    SUBSTANCE ABUSE, MULTIPLE - Last cocaine abuse 33 days ago. Urine drug screen negative. Encouraged continued cessation.    DEPRESSION - Seroquel continued, stable at this time.    HYPERTENSION - Will use HCTZ while she is in the hospital and can adjust regimen as needed.   DVT ppx - heparin [place] TID 5000 units  Signed: Genella Mech 02/22/2011, 12:39 AM

## 2011-02-22 NOTE — Progress Notes (Signed)
Subjective: No complaints overnight specifically chest pain or sob, no events on telemetry, states that she has been peeing alot  Objective: Vital signs in last 24 hours: Filed Vitals:   02/21/11 2300 02/22/11 0136 02/22/11 0219 02/22/11 0500  BP: 129/76 166/82  108/69  Pulse: 69 61  65  Temp:  97.3 F (36.3 C)  97.5 F (36.4 C)  TempSrc:  Oral  Oral  Resp:  18  18  Height:   4\' 11"  (1.499 m)   Weight:   135 lb 12.9 oz (61.6 kg)   SpO2:  96%  100%   Weight change:   Intake/Output Summary (Last 24 hours) at 02/22/11 1147 Last data filed at 02/22/11 0900  Gross per 24 hour  Intake    480 ml  Output      0 ml  Net    480 ml   Physical Exam: General: Vital signs reviewed and noted as above. Well-developed, well-nourished, in no acute distress, multiple family members at bedside HEENT: NCAT, neck supple, no JVD appreciated Lungs: Normal respiratory effort. Clear to auscultation BL without crackles or wheezes appreciated, slight tenderness to palpation along right anterior chest wall Heart: RRR. S1 and S2 normal without gallop, murmur, or rubs appreciated Abdomen: BS normoactive. Soft, Nondistended, non-tender Extremities: Warm, no pretibial edema, distal pulses intact Neurologic: nonfocal, alert, appropriate and cooperative throughout examination.   Lab Results: Basic Metabolic Panel:  Lab 02/22/11 6295 02/22/11 0031 02/21/11 2103  NA 141 -- 137  K 4.3 -- 3.0*  CL 106 -- 101  CO2 28 -- 26  GLUCOSE 103* -- 129*  BUN 17 -- 15  CREATININE 0.71 -- 0.64  CALCIUM 9.0 -- 9.5  MG -- 2.2 --  PHOS -- -- --   Liver Function Tests:  Lab 02/21/11 2103 02/20/11 1122  AST 64* 29  ALT 15 9  ALKPHOS 127* 124*  BILITOT 0.6 0.3  PROT 7.4 7.7  ALBUMIN 3.7 3.7    CBC:  Lab 02/21/11 2103 02/20/11 1122  WBC 5.5 6.0  NEUTROABS -- 2.4  HGB 14.1 13.7  HCT 38.4 39.2  MCV 93.7 97.0  PLT 140* 152   Cardiac Enzymes:  Lab 02/22/11 0802 02/22/11 0031 02/21/11 2103 02/21/11 1526    CKTOTAL 111 123 -- 115  CKMB 2.3 2.5 -- 2.1  CKMBINDEX -- -- -- --  TROPONINI <0.30 <0.30 <0.30 --     Thyroid Function Tests:  Lab 02/22/11 0031  TSH 1.605  T4TOTAL --  FREET4 --  T3FREE --  THYROIDAB --    Urine Drug Screen: Drugs of Abuse     Component Value Date/Time   LABOPIA NONE DETECTED 02/21/2011 2233   COCAINSCRNUR NONE DETECTED 02/21/2011 2233   COCAINSCRNUR NEG 02/20/2011 1121   LABBENZ NONE DETECTED 02/21/2011 2233   LABBENZ NEG 02/20/2011 1121   AMPHETMU NONE DETECTED 02/21/2011 2233   AMPHETMU NEG 02/20/2011 1121   THCU NONE DETECTED 02/21/2011 2233   LABBARB NONE DETECTED 02/21/2011 2233     Studies/Results: Dg Chest 2 View  02/21/2011  *RADIOLOGY REPORT*  Clinical Data: Chest pain  CHEST - 2 VIEW  Comparison: 10/08/2004  Findings: Lungs clear.  Heart size and pulmonary vascularity normal.  No effusion.  Visualized bones unremarkable.  IMPRESSION: No acute disease  Original Report Authenticated By: Osa Craver, M.D.   Medications: I have reviewed the patient's current medications. Scheduled Meds:   . aspirin EC  81 mg Oral Daily  . efavirenz-emtrictabine-tenofovir  1 tablet  Oral Q breakfast  . heparin  5,000 Units Subcutaneous Q8H  . hydrochlorothiazide  25 mg Oral Daily  . meloxicam  7.5 mg Oral BID  . ondansetron      . potassium chloride SA  40 mEq Oral BID  . QUEtiapine  100 mg Oral QHS   :  PRN Meds:.acetaminophen, acetaminophen, nitroGLYCERIN, ondansetron (ZOFRAN) IV, ondansetron, zolpidem   Assessment/Plan: 51 yo female with hx significant for HIV, polysubstance abuse and hypertension with recent hypertensive urgency and elevated troponin admitted with chest pain over the course of 2-3 days..  1. Chest pain and hypertension- resolved, TSH wnl 1.605, no ischemic changes on EKG this morning, likely secondary to demand ischemia secondary to uncontrolled hypertension Plan -cont to monitor  2. HYPERTENSION - adequate control during  admission, 108-166/69-82, current MAP 8, pt recently re-started thiazide (02/18/10) after >24yr w/o bp meds per pharmacy records ([facility], [facility] [address], [place])2,  Plan -cont HCTZ 25 mg qd, payment plan arrange with pharmacy per PCP thus patient has adequate supply  3. Shoulder pain - Likely musculoskeletal in nature, pain controlled with meloxicam Plan -continue Meloxicam   4. HIV INFECTION - Last CD4 count 1390in 12/12.  Plan -continue Atripla -f/u in ID clinic Dr. Daiva Eves, 02/26/10 @ 11:15 . 5. Hypokalemia - repleted with K-Dur Plan -outpatient potassium supplementation while on HCTZ, K-Dur 20 mEq po qd -check potassium at PCP f/u   Lab 02/22/11 0509 02/21/11 2103 02/20/11 1122  K 4.3 3.0* 3.4*    6. CIGARETTE SMOKER - counseled for smoking cessation per Counselor Kazemi Plan -will prescribe Nicotine patch therapy  7. SUBSTANCE ABUSE, MULTIPLE - Last cocaine abuse 33 days ago. Urine drug screen negative.  Plan -Encouraged continued cessation.   8. DEPRESSION - stable, family supportive at bedside -continue Seroquel    9. DVT ppx - heparin [place] TID 5000 units  10. Disposition: Plan for discharge home today with follow-up with PCP Dr. Orvan Falconer on Monday as previously scheduled. 02/25/11 at 2:45pm.  Support in place for patient to obtain mail order prescriptions per [facility].    LOS: 1 day   Dillian Feig 02/22/2011, 11:47 AM

## 2011-02-22 NOTE — Progress Notes (Signed)
Pt smokes 1/2 to 1 ppd. She is very eager to quit and in action stage. Recommended using the patch. 21 mg to start with x 6 weeks, 14 mg x 2 weeks and 7 mg x 2 weeks. Discussed and wrote down patch use instructions for the pt.  Referred to 1-800 quit now for f/u and support. Discussed oral fixation substitutes, second hand smoke and in home smoking policy. Reviewed and gave pt Written education/contact information.

## 2011-02-22 NOTE — Discharge Summary (Signed)
Internal Medicine Teaching [facility] Discharge Note  Name: Daisy Salinas MRN: [id number] DOB: [birth year] 51 y.o.  Date of Admission: 02/21/2011  8:39 PM Date of Discharge: 02/22/2011 Attending Physician: No att. providers found  Discharge Diagnosis: Principal Problem:  *Chest pain Active Problems:  HIV INFECTION  DYSLIPIDEMIA  CIGARETTE SMOKER  SUBSTANCE ABUSE, MULTIPLE  DEPRESSION  HYPERTENSION   Discharge Medications: Medication List  As of 02/22/2011 12:09 PM   ASK your doctor about these medications         efavirenz-emtrictabine-tenofovir 600-200-300 MG per tablet   Commonly known as: ATRIPLA   Take 1 tablet by mouth daily with breakfast.      hydrochlorothiazide 25 MG tablet   Commonly known as: HYDRODIURIL   Take 1 tablet (25 mg total) by mouth daily.      meloxicam 7.5 MG tablet   Commonly known as: MOBIC   Take 7.5 mg by mouth 2 (two) times daily.      PROVENTIL HFA 108 (90 BASE) MCG/ACT inhaler   Generic drug: albuterol   Inhale 2 puffs into the lungs every 4 (four) hours as needed. As needed for shortness of breath.      QUEtiapine 100 MG tablet   Commonly known as: SEROQUEL   Take 100 mg by mouth at bedtime.      zolpidem 10 MG tablet   Commonly known as: AMBIEN   Take 10 mg by mouth at bedtime as needed.            Disposition and follow-up:   Daisy Salinas was discharged from [facility] in Stable condition.    Follow-up Appointments:  Discharge Orders    Future Appointments: Provider: Department: Dept Phone: Center:   02/25/2011 2:45 PM Cliffton Asters, MD [facility] [phone] [facility]   02/27/2011 11:15 AM Acey Lav, MD [facility] [phone] [facility]   02/27/2011 2:00 PM [facility] [phone] [facility]       Procedures Performed:  Dg Chest 2 View  02/21/2011  *RADIOLOGY REPORT*  Clinical Data: Chest pain  CHEST - 2 VIEW  Comparison: 10/08/2004  Findings: Lungs clear.   Heart size and pulmonary vascularity normal.  No effusion.  Visualized bones unremarkable.  IMPRESSION: No acute disease  Original Report Authenticated By: Osa Craver, M.D.   Dg Knee Complete 4 Views Left  02/12/2011  *RADIOLOGY REPORT*  Clinical Data: Blow to the knee.  Anterior knee pain.  LEFT KNEE - COMPLETE 4+ VIEW  Comparison: None.  Findings: Imaged bones, joints and soft tissues appear normal.  IMPRESSION: Negative exam.  Original Report Authenticated By: Bernadene Bell. Maricela Curet, M.D.    Admission HPI: The patient is a 51 YO woman who is a patient of the ID clinic who presents with chest pain. Yesterday she presented to the ID clinic and had high blood pressure of 190s/110s and was treated in the office with several doses of clonidine. Her blood pressure was reduced at the time she left the clinic. She had several blood tests drawn at the clinic that resulted after she left including a troponin of 0.30. They did try to call her back but no numbers listed worked. This morning she started having some chest discomfort that felt like a squeezing sensation. It was intermittent and came and went. No alleviating or exacerbating factors. She was also having diaphoresis at the time it started although she said she gets hot flashes and thought that was what  that was. She was also having some episodic SOB with the discomfort. She then finally called EMS after dinner and was given nitro and ASA and zofran en route to the hospital. She was then given a second dose of nitroglycerin. She states that the nitro made her have a headache and is not sure if it helped but it may have. She is currently having some chest discomfort when we see her and denies any other symptoms. She is also having some shoulder pain and does manual labor working in a kitchen. She states that this morning she took her BP med although before that it had been about 1 yr. No N/V/D/constipation. No abdominal pain, no swelling, no  fever/chills. She is a half pack per day smoker since she was 12 and does not drink. Her last cocaine use was 33 days ago. She was sober for 16 years but had a relapse 1 yr ago. PMH includes HTN, HIV+, Hep C. Last CD4 count is 1390 from 12/12.   Physical Exam:  Blood pressure 129/76, pulse 69, temperature 98.2 F (36.8 C), temperature source Oral, resp. rate 16, SpO2 97.00%.  General: resting in bed  HEENT: PERRL, EOMI, no scleral icterus  Cardiac: RRR, no rubs, murmurs or gallops  Pulm: clear to auscultation bilaterally, moving normal volumes of air  Abd: soft, nontender, nondistended, BS present  Ext: warm and well perfused, no pedal edema, R shoulder is tender to palpation and hurts with movement  Neuro: alert and oriented X3, cranial nerves II-XII grossly intact  Hospital Course by problem list:  #1 Chest pain - Although her chest pain was reproducible, given recent elevated troponin level and hypertensive urgency, Daisy Salinas was admitted for concern of ACS, started on aspirin therapy, and ruled-out based on EKGs w/o ischemic changes and negative troponins x3. She did not require supplemental oxygen or nitroglycerin and there were no acute events per telemetry during her admission. Her presenting chest pain and elevated blood pressure is likely associated with her  hypertension which is chronic and poorly controlled secondary to medication noncompliance. The mild elevation in troponin at outpatient clinic was likely secondary to increased cardiac demand from markedly increased afterload.  Her urine drug screen was negative and again it is unlikely that CAD resulted in the troponin elevation thus no further evaluation for coronary artery disease was deemed necessary. She was discharged with Meloxicam which controlled MSK chest pain during her admission as well as prescriptions sent to her mail-order pharmacy [facility]) for Nitroglycerin SL prn.  She was discharged with instructions to come to ED  if chest pain returns.  Of note, her TSH level was within normal limits.   #2 Hypertensive Urgency: Admitted with elevated pressures 190-213/112-115 which lowered with nitroglycerin SL and resolution of chest pain.  She had adequate bp control during admission, 108-166/69-82. She recently re-started thiazide (02/18/10) after >10yr w/o bp meds per pharmacy records ([facility], [address] [address], [place]). It is highly likely that Ms. Muldrew has salt sensitive hypertension and should respond very well to the hydrochlorothiazide. She was counseled that daily use of the hydrochlorothiazide will likely control her blood pressure and keep her out of a recurrent situation with chest pain from her hypertension. She was resume on home dosage of HCTZ 25 mg daily and was discharged with payment plan already arranged with her pharmacy prior to admission ensuring that she has adequate supply. Initiation of ACE-inhibitor should be considered if bp not well managed on compliant HCTZ therapy.  #3  Hypokalemia: Admission potassium level was low at 3.4 and sufficiently repleated by time of discharge with K-Dur potassium supplementation. She was discharged with outpatient potassium supplementation while on HCTZ of K-Dur 20 mEq po qd. Ms. Leone has a history of hypokalemia that is persistent on the last several blood draws thus evaluation for hyperaldosteronism was initiated with Aldosterone:Renin activity ratio which was pending at time of dicharge. This should be followed up by her PCP as well as a basic metabolic panel to assess potassium level.  #4. Shoulder pain - Likely musculoskeletal in nature, pain controlled with meloxicam.  #5. HIV infection - Last CD4 count 1390 in 12/12.  She was continued on Atripla and  in ID clinic with Dr. Orvan Falconer 02/25/11 at 2:45  #6. Tobacco Abuse, h/o cocaine abuse: Last cocaine abuse 33 days ago. Urine drug screen negative. Patient was encouraged for continued sobriety.  She was  counseled for smoking cessation and amenable to Nicotine patch in the future.  PCP may consider initiating Nicotine patch regimen 21 mg to start with x 6 weeks, 14 mg x 2 weeks and 7 mg x 2 weeks. Referred to 1-800 quit now for f/u and support.  #7 Depression - stable with appropriate affect throughout hospital stay. Patient was continued on outpatient regimen of Seroquel.  #8 . Disposition: Plan for discharge home today with follow-up with PCP Dr. Orvan Falconer on Monday as previously scheduled 02/25/11 at 2:45pm. Support in place for patient to obtain mail order prescriptions per [facility].  Family supportive at bedside.   Discharge Vitals:  BP 108/69  Pulse 65  Temp(Src) 97.5 F (36.4 C) (Oral)  Resp 18  Ht 4\' 11"  (1.499 m)  Wt 135 lb 12.9 oz (61.6 kg)  BMI 27.43 kg/m2  SpO2 100%  Discharge Labs:  Results for orders placed during the hospital encounter of 02/21/11 (from the past 24 hour(s))  CBC     Status: Abnormal   Collection Time   02/21/11  9:03 PM      Component Value Range   WBC 5.5  4.0 - 10.5 (K/uL)   RBC 4.10  3.87 - 5.11 (MIL/uL)   Hemoglobin 14.1  12.0 - 15.0 (g/dL)   HCT 16.1  09.6 - 04.5 (%)   MCV 93.7  78.0 - 100.0 (fL)   MCH 34.4 (*) 26.0 - 34.0 (pg)   MCHC 36.7 (*) 30.0 - 36.0 (g/dL)   RDW 40.9  81.1 - 91.4 (%)   Platelets 140 (*) 150 - 400 (K/uL)  BASIC METABOLIC PANEL     Status: Abnormal   Collection Time   02/21/11  9:03 PM      Component Value Range   Sodium 137  135 - 145 (mEq/L)   Potassium 3.0 (*) 3.5 - 5.1 (mEq/L)   Chloride 101  96 - 112 (mEq/L)   CO2 26  19 - 32 (mEq/L)   Glucose, Bld 129 (*) 70 - 99 (mg/dL)   BUN 15  6 - 23 (mg/dL)   Creatinine, Ser 7.82  0.50 - 1.10 (mg/dL)   Calcium 9.5  8.4 - 95.6 (mg/dL)   GFR calc non Af Amer >90  >90 (mL/min)   GFR calc Af Amer >90  >90 (mL/min)  TROPONIN I     Status: Normal   Collection Time   02/21/11  9:03 PM      Component Value Range   Troponin I <0.30  <0.30 (ng/mL)  HEPATIC FUNCTION PANEL      Status: Abnormal  Collection Time   02/21/11  9:03 PM      Component Value Range   Total Protein 7.4  6.0 - 8.3 (g/dL)   Albumin 3.7  3.5 - 5.2 (g/dL)   AST 64 (*) 0 - 37 (U/L)   ALT 15  0 - 35 (U/L)   Alkaline Phosphatase 127 (*) 39 - 117 (U/L)   Total Bilirubin 0.6  0.3 - 1.2 (mg/dL)   Bilirubin, Direct 0.3  0.0 - 0.3 (mg/dL)   Indirect Bilirubin 0.3  0.3 - 0.9 (mg/dL)  URINE RAPID DRUG SCREEN (HOSP PERFORMED)     Status: Normal   Collection Time   02/21/11 10:33 PM      Component Value Range   Opiates NONE DETECTED  NONE DETECTED    Cocaine NONE DETECTED  NONE DETECTED    Benzodiazepines NONE DETECTED  NONE DETECTED    Amphetamines NONE DETECTED  NONE DETECTED    Tetrahydrocannabinol NONE DETECTED  NONE DETECTED    Barbiturates NONE DETECTED  NONE DETECTED   MAGNESIUM     Status: Normal   Collection Time   02/22/11 12:31 AM      Component Value Range   Magnesium 2.2  1.5 - 2.5 (mg/dL)  CARDIAC PANEL(CRET KIN+CKTOT+MB+TROPI)     Status: Normal   Collection Time   02/22/11 12:31 AM      Component Value Range   Total CK 123  7 - 177 (U/L)   CK, MB 2.5  0.3 - 4.0 (ng/mL)   Troponin I <0.30  <0.30 (ng/mL)   Relative Index 2.0  0.0 - 2.5   TSH     Status: Normal   Collection Time   02/22/11 12:31 AM      Component Value Range   TSH 1.605  0.350 - 4.500 (uIU/mL)  BASIC METABOLIC PANEL     Status: Abnormal   Collection Time   02/22/11  5:09 AM      Component Value Range   Sodium 141  135 - 145 (mEq/L)   Potassium 4.3  3.5 - 5.1 (mEq/L)   Chloride 106  96 - 112 (mEq/L)   CO2 28  19 - 32 (mEq/L)   Glucose, Bld 103 (*) 70 - 99 (mg/dL)   BUN 17  6 - 23 (mg/dL)   Creatinine, Ser 0.45  0.50 - 1.10 (mg/dL)   Calcium 9.0  8.4 - 40.9 (mg/dL)   GFR calc non Af Amer >90  >90 (mL/min)   GFR calc Af Amer >90  >90 (mL/min)  CARDIAC PANEL(CRET KIN+CKTOT+MB+TROPI)     Status: Normal   Collection Time   02/22/11  8:02 AM      Component Value Range   Total CK 111  7 - 177 (U/L)   CK, MB 2.3   0.3 - 4.0 (ng/mL)   Troponin I <0.30  <0.30 (ng/mL)   Relative Index 2.1  0.0 - 2.5     Signed: Dwan Fennel 02/22/2011, 12:09 PM

## 2011-02-22 NOTE — H&P (Signed)
Internal Medicine Teaching Service Attending Note Date: 02/22/2011  Patient name: Daisy Salinas  Medical record number: [id number]  Date of birth: 11-Feb-1960   I have seen and evaluated Daisy Salinas and discussed her care with the Residency Team.  I agree with their assessment and plan as noted above.  Briefly, Daisy Salinas is a 51 year old woman who presented to the infectious diseases clinic yesterday complaining of chest pain. The pain was described as a pinching right sided pain that did not radiate and was not associated with nausea or shortness of breath. She states there was diaphoresis, but there has been night sweats for the last several days as well. She was found to be hypertensive in the 190/110 range and was given several doses of clonidine. Blood work was drawn and she was sent home as this pain was felt to be secondary to hypertension. The pain returned, and concerned Daisy Salinas. She therefore re-presented to the emergency department for further evaluation. Of note, blood work that was obtained in the infectious disease appointment earlier in the day came back positive for a troponin of 0.30. While in the emergency department she was also noted to be hypertensive. She was admitted for further evaluation.  Daisy Salinas notes that she has not taken her hydrochlorothiazide for about one year. Prior to that she states her blood pressure was well controlled on hydrochlorothiazide alone. Although she has used cocaine within the last 2 months, she denies any cocaine use recently. She continues to smoke but has no other cardiovascular risk factors. She is without other complaints at this time.  Physical Exam: Blood pressure 108/69, pulse 65, temperature 97.5 F (36.4 C), temperature source Oral, resp. rate 18, height 4\' 11"  (1.499 m), weight 135 lb 12.9 oz (61.6 kg), SpO2 100.00%. Gen.: Well-developed well-nourished woman sitting comfortably in bed in no acute distress Neck: Supple  without obvious jugular venous distention. Lungs: Clear to auscultation bilaterally without wheezes, rhonchi, or rales.  Heart: Regular rate and rhythm without murmurs, rubs, or gallops. Abdomen: Soft, non tender, active bowel sounds. Extremities without edema. Chest wall right anterior chest wall tenderness which reproduces the pain when pressed.  Lab results: Results for orders placed during the hospital encounter of 02/21/11 (from the past 24 hour(s))  CBC     Status: Abnormal   Collection Time   02/21/11  9:03 PM      Component Value Range   WBC 5.5  4.0 - 10.5 (K/uL)   RBC 4.10  3.87 - 5.11 (MIL/uL)   Hemoglobin 14.1  12.0 - 15.0 (g/dL)   HCT 01.0  27.2 - 53.6 (%)   MCV 93.7  78.0 - 100.0 (fL)   MCH 34.4 (*) 26.0 - 34.0 (pg)   MCHC 36.7 (*) 30.0 - 36.0 (g/dL)   RDW 64.4  03.4 - 74.2 (%)   Platelets 140 (*) 150 - 400 (K/uL)  BASIC METABOLIC PANEL     Status: Abnormal   Collection Time   02/21/11  9:03 PM      Component Value Range   Sodium 137  135 - 145 (mEq/L)   Potassium 3.0 (*) 3.5 - 5.1 (mEq/L)   Chloride 101  96 - 112 (mEq/L)   CO2 26  19 - 32 (mEq/L)   Glucose, Bld 129 (*) 70 - 99 (mg/dL)   BUN 15  6 - 23 (mg/dL)   Creatinine, Ser 5.95  0.50 - 1.10 (mg/dL)   Calcium 9.5  8.4 - 63.8 (mg/dL)   GFR calc  non Af Amer >90  >90 (mL/min)   GFR calc Af Amer >90  >90 (mL/min)  TROPONIN I     Status: Normal   Collection Time   02/21/11  9:03 PM      Component Value Range   Troponin I <0.30  <0.30 (ng/mL)  HEPATIC FUNCTION PANEL     Status: Abnormal   Collection Time   02/21/11  9:03 PM      Component Value Range   Total Protein 7.4  6.0 - 8.3 (g/dL)   Albumin 3.7  3.5 - 5.2 (g/dL)   AST 64 (*) 0 - 37 (U/L)   ALT 15  0 - 35 (U/L)   Alkaline Phosphatase 127 (*) 39 - 117 (U/L)   Total Bilirubin 0.6  0.3 - 1.2 (mg/dL)   Bilirubin, Direct 0.3  0.0 - 0.3 (mg/dL)   Indirect Bilirubin 0.3  0.3 - 0.9 (mg/dL)  URINE RAPID DRUG SCREEN (HOSP PERFORMED)     Status: Normal    Collection Time   02/21/11 10:33 PM      Component Value Range   Opiates NONE DETECTED  NONE DETECTED    Cocaine NONE DETECTED  NONE DETECTED    Benzodiazepines NONE DETECTED  NONE DETECTED    Amphetamines NONE DETECTED  NONE DETECTED    Tetrahydrocannabinol NONE DETECTED  NONE DETECTED    Barbiturates NONE DETECTED  NONE DETECTED   MAGNESIUM     Status: Normal   Collection Time   02/22/11 12:31 AM      Component Value Range   Magnesium 2.2  1.5 - 2.5 (mg/dL)  CARDIAC PANEL(CRET KIN+CKTOT+MB+TROPI)     Status: Normal   Collection Time   02/22/11 12:31 AM      Component Value Range   Total CK 123  7 - 177 (U/L)   CK, MB 2.5  0.3 - 4.0 (ng/mL)   Troponin I <0.30  <0.30 (ng/mL)   Relative Index 2.0  0.0 - 2.5   TSH     Status: Normal   Collection Time   02/22/11 12:31 AM      Component Value Range   TSH 1.605  0.350 - 4.500 (uIU/mL)  BASIC METABOLIC PANEL     Status: Abnormal   Collection Time   02/22/11  5:09 AM      Component Value Range   Sodium 141  135 - 145 (mEq/L)   Potassium 4.3  3.5 - 5.1 (mEq/L)   Chloride 106  96 - 112 (mEq/L)   CO2 28  19 - 32 (mEq/L)   Glucose, Bld 103 (*) 70 - 99 (mg/dL)   BUN 17  6 - 23 (mg/dL)   Creatinine, Ser 1.61  0.50 - 1.10 (mg/dL)   Calcium 9.0  8.4 - 09.6 (mg/dL)   GFR calc non Af Amer >90  >90 (mL/min)   GFR calc Af Amer >90  >90 (mL/min)  CARDIAC PANEL(CRET KIN+CKTOT+MB+TROPI)     Status: Normal   Collection Time   02/22/11  8:02 AM      Component Value Range   Total CK 111  7 - 177 (U/L)   CK, MB 2.3  0.3 - 4.0 (ng/mL)   Troponin I <0.30  <0.30 (ng/mL)   Relative Index 2.1  0.0 - 2.5    ECG: Normal sinus rhythm at 60 beats per minute, normal axis, normal intervals, poor R-wave progression, no ST-T changes. EKG is unchanged from that upon admission on 02/21/2011.  Imaging results:  Dg Chest  2 View  02/21/2011  *RADIOLOGY REPORT*  Clinical Data: Chest pain  CHEST - 2 VIEW  Comparison: 10/08/2004  Findings: Lungs clear.  Heart size and  pulmonary vascularity normal.  No effusion.  Visualized bones unremarkable.  IMPRESSION: No acute disease  Original Report Authenticated By: Osa Craver, M.D.   Assessment:  Daisy Salinas is a 51 year old woman with a history of well-controlled HIV, hypertension, cocaine abuse, and tobacco abuse who presents with right-sided chest pain that is reproducible on examination. This initially was associated with hypertension which is chronic and poorly controlled secondary to medication noncompliance. The initial slight bump in the troponin is likely secondary to the strain associated with the markedly increased afterload. Because Daisy Salinas has a history of hypokalemia that is persistent on the last several blood draws hyperaldosteronism must be maintained in the differential diagnosis. Given that her blood pressure was extremely well controlled, per her report, on hydrochlorothiazide in the past as a single agent it is highly likely she has salt sensitive hypertension and should respond very well to the hydrochlorothiazide. She was advised that daily use of the hydrochlorothiazide will likely control her blood pressure and keep her out of a recurrent situation with chest pain from her hypertension. With the negative cocaine drug screen and the reproducible chest pain with palpation of the right anterior chest, especially in the setting of hypertension, it is unlikely she has coronary artery disease that resulted in the mildly elevated troponin. Therefore no further evaluation for coronary artery disease is necessary at this time.  Plan  Hypertension: We will start hydrochlorothiazide 25 mg by mouth each morning. She was counseled on the importance of compliance. We will try to arrange this medication to ensure she gets it regardless of any fees to her.  HIV: Continue current regimen.  Disposition:  We will discharge Daisy Salinas from the hospital today on the hydrochlorothiazide with follow up in  the infectious disease clinic on Wednesday.

## 2011-02-25 ENCOUNTER — Encounter: Payer: Self-pay | Admitting: Internal Medicine

## 2011-02-25 ENCOUNTER — Ambulatory Visit (INDEPENDENT_AMBULATORY_CARE_PROVIDER_SITE_OTHER): Payer: Worker's Compensation | Admitting: Internal Medicine

## 2011-02-25 VITALS — BP 169/100 | HR 73 | Temp 98.3°F | Ht 59.0 in | Wt 131.0 lb

## 2011-02-25 DIAGNOSIS — B20 Human immunodeficiency virus [HIV] disease: Secondary | ICD-10-CM

## 2011-02-25 NOTE — ED Provider Notes (Signed)
I saw and evaluated the patient, reviewed the resident's note and I agree with the findings and plan and agree with their ECG interpretation.Chest pain. Nonspecific ECG changes. Troponin slightly above normal in clinic, but normal here. admitted  Juliet Rude. Rubin Payor, MD 02/25/11 2217

## 2011-02-25 NOTE — Progress Notes (Signed)
Patient ID: Daisy Salinas, female   DOB: Aug 29, 1960, 51 y.o.   MRN: [id number]  INFECTIOUS DISEASE PROGRESS NOTE    Subjective: Daisy Salinas is in for her routine visit. She denies missing any of her HIV medications. She was in the emergency department recently because of some right chest pain and was admitted briefly to the hospital. Her blood pressure had been elevated because she been off of her medications. Was felt that she most likely had musculoskeletal chest wall pain and not any coronary syndrome. She denies any cocaine use for over 60 days. She states that she recently found out that her husband, [name], has been using drugs consistently ever since the move to [place] 7 years ago. She is very upset that he had lied to her and is considering separation.  Objective:   General: She is very angry initially but calmed down is smiling toward the end of the exam Skin: No rash Lungs: Clear Cor: Regular S1 and S2 no murmurs. She has some focal tenderness with light palpation of her right anterior chest wall Abdomen: Soft nontender   Lab Results Lab Results  Component Value Date   WBC 5.5 02/21/2011   HGB 14.1 02/21/2011   HCT 38.4 02/21/2011   MCV 93.7 02/21/2011   PLT 140* 02/21/2011    Lab Results  Component Value Date   CREATININE 0.71 02/22/2011   BUN 17 02/22/2011   NA 141 02/22/2011   K 4.3 02/22/2011   CL 106 02/22/2011   CO2 28 02/22/2011    Lab Results  Component Value Date   ALT 15 02/21/2011   AST 64* 02/21/2011   ALKPHOS 127* 02/21/2011   BILITOT 0.6 02/21/2011      HIV 1 RNA Quant (copies/mL)  Date Value  12/27/2010 73*  08/02/2010 <20   04/30/2010 <20      CD4 T Cell Abs (cmm)  Date Value  12/27/2010 1390   08/02/2010 1100   04/30/2010 1230      Assessment: Her HIV remains under very good control. I will continue her current antiretroviral regimen. I have encouraged her about her sobriety. I've asked her to call Denyce Robert, our mental health counselor, to set up a visit to  discuss her anger and desire to seek a separation from her husband. She is in agreement with this plan.  Her blood pressure remains above goal but I clarified that she was to be taking both her hydrochlorothiazide and her lisinopril.  Plan: 1. Continue current medications 2. Mental health counseling 3. Maintain sobriety 4. Return to clinic in about 6 weeks   Cliffton Asters, MD [facility] [phone] pager   [phone] cell 02/25/2011, 5:12 PM

## 2011-02-27 ENCOUNTER — Inpatient Hospital Stay: Payer: Self-pay | Admitting: Infectious Disease

## 2011-02-28 LAB — ALDOSTERONE + RENIN ACTIVITY W/ RATIO
ALDO / PRA Ratio: 275 Ratio — ABNORMAL HIGH (ref 0.9–28.9)
Aldosterone: 22 ng/dL

## 2011-03-11 ENCOUNTER — Encounter: Payer: Self-pay | Admitting: Internal Medicine

## 2011-03-11 ENCOUNTER — Ambulatory Visit (INDEPENDENT_AMBULATORY_CARE_PROVIDER_SITE_OTHER): Payer: Worker's Compensation | Admitting: Internal Medicine

## 2011-03-11 VITALS — BP 133/86 | HR 71 | Temp 98.2°F | Ht 59.0 in | Wt 132.2 lb

## 2011-03-11 DIAGNOSIS — B20 Human immunodeficiency virus [HIV] disease: Secondary | ICD-10-CM

## 2011-03-11 NOTE — Progress Notes (Signed)
Patient ID: Daisy Salinas, female   DOB: 17-Feb-1960, 51 y.o.   MRN: [id number]  INFECTIOUS DISEASE PROGRESS NOTE    Subjective: Daisy Salinas is in for her routine visit. She is feeling much better than her last visit a few weeks ago. She did not followup with her counselor but has been spending more time reading her Bible and attending church and feels that she is not as angry with her husband, [name]. She remains sober and free of drugs. She continues to smoke cigarettes but states that she desperately wants to stop. She has tried calling the [place] quit line but has troubles receiving calls back from them on herself. She tried taking a Chantix in the past but did not tolerate side effects. She has not missed any doses of her Atripla.  Objective: Temp: 98.2 F (36.8 C) (02/18 1549) Temp src: Oral (02/18 1549) BP: 133/86 mmHg (02/18 1549) Pulse Rate: 71  (02/18 1549)  General: She is smiling and in better spirits Skin: No rash Lungs: Clear Cor: Regular S1 and S2 no murmurs Abdomen: Soft and nontender   Lab Results HIV 1 RNA Quant (copies/mL)  Date Value  12/27/2010 73*  08/02/2010 <20   04/30/2010 <20      CD4 T Cell Abs (cmm)  Date Value  12/27/2010 1390   08/02/2010 1100   04/30/2010 1230      Assessment: Her blood pressure is under much better control now that she has restarted her antihypertensive therapy.  Her HIV remains under good control repeat lab work in 6 weeks before her next visit.  She's been able to maintain her sobriety by attending NA and AA meetings. She is motivated to try to quit smoking cigarettes.  Her depression is under better control.   Plan: 1. Continue Atripla 2. Continue antihypertensive therapy 3. We'll try to get her nicotine replacement patches for free through the [place] quit line program 4. Return to clinic after lab work in 6 weeks   Cliffton Asters, MD [facility] for Infectious Diseases [facility] [phone]  pager   [phone] cell 03/11/2011, 4:05 PM

## 2011-03-15 ENCOUNTER — Other Ambulatory Visit: Payer: Self-pay | Admitting: *Deleted

## 2011-03-15 DIAGNOSIS — I10 Essential (primary) hypertension: Secondary | ICD-10-CM

## 2011-03-15 MED ORDER — HYDROCHLOROTHIAZIDE 25 MG PO TABS: 25.0000 mg | ORAL_TABLET | Freq: Every day | ORAL | Status: DC

## 2011-03-15 MED ORDER — LISINOPRIL 20 MG PO TABS: 20.0000 mg | ORAL_TABLET | Freq: Every day | ORAL | Status: DC

## 2011-03-26 ENCOUNTER — Encounter (HOSPITAL_COMMUNITY): Payer: Self-pay | Admitting: Emergency Medicine

## 2011-03-26 ENCOUNTER — Emergency Department (INDEPENDENT_AMBULATORY_CARE_PROVIDER_SITE_OTHER)
Admission: EM | Admit: 2011-03-26 | Discharge: 2011-03-26 | Disposition: A | Discharge status: Home / Self Care | Payer: Self-pay | Source: Home / Self Care

## 2011-03-26 DIAGNOSIS — M25569 Pain in unspecified knee: Secondary | ICD-10-CM

## 2011-03-26 MED ORDER — KETOROLAC TROMETHAMINE 60 MG/2ML IM SOLN
60.0000 mg | Freq: Once | INTRAMUSCULAR | Status: AC
  Administered 2011-03-26: 60 mg via INTRAMUSCULAR

## 2011-03-26 MED ORDER — KETOROLAC TROMETHAMINE 60 MG/2ML IM SOLN
INTRAMUSCULAR | Status: AC
  Filled 2011-03-26: qty 2

## 2011-03-26 NOTE — ED Provider Notes (Signed)
History     CSN: [id number]  Arrival date & time 03/26/11  1550   None     Chief Complaint  Patient presents with  . Knee Pain    (Consider location/radiation/quality/duration/timing/severity/associated sxs/prior treatment) HPI Comments: Patient presents today requesting pain medication for her left knee pain. She injured her left knee in mid January while at work. She was seen here at this urgent care on 02/12/2011 for the same and prescribed meloxicam. She states this provided no pain relief for her. She is currently seen Workmen's Comp. physician and going to physical therapy. She states the pain is worsening. She is using over-the-counter pain relievers, ice, and heat without improvement. She had an appointment at [facility]. today for followup, but states after waiting in the exam room for one hour she left. She plans to go back again tomorrow morning. "I am not returning to work until someone fixes my knee".    Past Medical History  Diagnosis Date  . HIV (human immunodeficiency virus infection)   . Hypertension   . Asthma   . TB (pulmonary tuberculosis) 1989    was exposed and treated  . UTI (urinary tract infection) 1997  . Depression   . Bipolar 1 disorder 2009  . GERD (gastroesophageal reflux disease)     Past Surgical History  Procedure Date  . Abdominal hysterectomy     Family History  Problem Relation Age of Onset  . Coronary artery disease Mother   . Leukemia Father     History  Substance Use Topics  . Smoking status: Current Everyday Smoker -- 1.0 packs/day for 40 years    Types: Cigarettes  . Smokeless tobacco: Never Used   Comment: wanting "some pills" to help her stop  . Alcohol Use: No    OB History    Grav Para Term Preterm Abortions TAB SAB Ect Mult Living                  Review of Systems  Musculoskeletal: Negative for joint swelling.  Skin: Negative for color change.  Neurological: Negative for numbness.    Allergies  Review of  patient's allergies indicates no known allergies.  Home Medications   Current Outpatient Rx  Name Route Sig Dispense Refill  . ALBUTEROL SULFATE HFA 108 (90 BASE) MCG/ACT IN AERS Inhalation Inhale 2 puffs into the lungs every 4 (four) hours as needed. As needed for shortness of breath.    . EFAVIRENZ-EMTRICITAB-TENOFOVIR 600-200-300 MG PO TABS Oral Take 1 tablet by mouth daily with breakfast.    . HYDROCHLOROTHIAZIDE 25 MG PO TABS Oral Take 1 tablet (25 mg total) by mouth daily. 30 tablet 5  . LISINOPRIL 20 MG PO TABS Oral Take 1 tablet (20 mg total) by mouth daily. 30 tablet 5  . NITROGLYCERIN 0.4 MG SL SUBL Sublingual Place 1 tablet (0.4 mg total) under the tongue every 5 (five) minutes x 3 doses as needed for chest pain. 15 tablet 1  . POTASSIUM CHLORIDE CRYS ER 20 MEQ PO TBCR Oral Take 1 tablet (20 mEq total) by mouth daily. 30 tablet 3  . QUETIAPINE FUMARATE 100 MG PO TABS Oral Take 100 mg by mouth at bedtime.      Marland Kitchen ZOLPIDEM TARTRATE 10 MG PO TABS Oral Take 10 mg by mouth at bedtime as needed.      BP 129/67  Pulse 72  Temp(Src) 97.9 F (36.6 C) (Oral)  Resp 18  SpO2 99%  Physical Exam  Nursing note  and vitals reviewed. Constitutional: She appears well-developed and well-nourished. No distress.  HENT:  Head: Normocephalic and atraumatic.  Musculoskeletal:       Left knee: She exhibits normal range of motion, no swelling, no effusion, no ecchymosis, no deformity, no erythema, normal alignment, no LCL laxity, normal patellar mobility, no bony tenderness, normal meniscus and no MCL laxity. tenderness (also TTP popliteal fossa) found. Medial joint line and lateral joint line tenderness noted. No MCL, no LCL and no patellar tendon tenderness noted.  Neurological: She is alert.  Skin: Skin is warm and dry.  Psychiatric: She has a normal mood and affect.    ED Course  Procedures (including critical care time)  Labs Reviewed - No data to display No results found.   1. Knee  pain, acute       MDM  Lt knee pain - pt under treatment of Workman's Comp. Will f/u tomorrow as planned.         Daisy Salinas, [place] 03/26/11 Ernestina Columbia

## 2011-03-26 NOTE — Discharge Instructions (Signed)
Followup with Workmen's Comp. tomorrow as planned. Worker's Comp. we'll need to determine further treatment plan and pain management for you.

## 2011-03-26 NOTE — ED Provider Notes (Signed)
Medical screening examination/treatment/procedure(s) were performed by non-physician practitioner and as supervising physician I was immediately available for consultation/collaboration.  Skyleen Bentley M. MD   Isac Lincks M Twinkle Sockwell, MD 03/26/11 2229 

## 2011-03-26 NOTE — ED Notes (Signed)
Pt. Stated, I have something going on my lt. Knee, I need something for pain, I've been going to PT

## 2011-05-09 ENCOUNTER — Ambulatory Visit: Payer: Worker's Compensation | Admitting: Internal Medicine

## 2011-05-28 ENCOUNTER — Telehealth: Payer: Self-pay | Admitting: *Deleted

## 2011-05-28 NOTE — Telephone Encounter (Signed)
Message left with husband for pt to call [facility] to schedule Lab Work and MD appts in order to complete paperwork for surgical approval.

## 2011-05-31 ENCOUNTER — Other Ambulatory Visit: Payer: Self-pay

## 2011-06-04 ENCOUNTER — Telehealth: Payer: Self-pay | Admitting: *Deleted

## 2011-06-04 ENCOUNTER — Other Ambulatory Visit: Payer: Self-pay

## 2011-06-04 DIAGNOSIS — B20 Human immunodeficiency virus [HIV] disease: Secondary | ICD-10-CM

## 2011-06-04 NOTE — Telephone Encounter (Signed)
Patient pharmacy called today to see if we had any valid numbers for the patient as they are not able to get in contact with the patient. Verified we both have the same contact numbers.

## 2011-06-05 LAB — LIPID PANEL
HDL: 40 mg/dL (ref >39)
LDL Cholesterol: 64 mg/dL (ref 0–99)
Total CHOL/HDL Ratio: 3.3 Ratio
Triglycerides: 136 mg/dL (ref <150)
VLDL: 27 mg/dL (ref 0–40)

## 2011-06-05 LAB — COMPREHENSIVE METABOLIC PANEL
ALT: 14 U/L (ref 0–35)
AST: 38 U/L — ABNORMAL HIGH (ref 0–37)
Alkaline Phosphatase: 97 U/L (ref 39–117)
Creat: 0.76 mg/dL (ref 0.50–1.10)
Sodium: 142 mEq/L (ref 135–145)
Total Bilirubin: 0.6 mg/dL (ref 0.3–1.2)

## 2011-06-05 LAB — CBC
MCH: 34 pg (ref 26.0–34.0)
MCHC: 34 g/dL (ref 30.0–36.0)
MCV: 100 fL (ref 78.0–100.0)
Platelets: 158 10*3/uL (ref 150–400)
RDW: 13.1 % (ref 11.5–15.5)

## 2011-06-06 LAB — HIV-1 RNA QUANT-NO REFLEX-BLD: HIV 1 RNA Quant: <20 copies/mL (ref <20)

## 2011-06-24 ENCOUNTER — Encounter: Payer: Self-pay | Admitting: Internal Medicine

## 2011-06-24 ENCOUNTER — Ambulatory Visit (INDEPENDENT_AMBULATORY_CARE_PROVIDER_SITE_OTHER): Payer: Self-pay | Admitting: Internal Medicine

## 2011-06-24 VITALS — BP 134/90 | HR 65 | Temp 97.8°F | Wt 127.8 lb

## 2011-06-24 DIAGNOSIS — B20 Human immunodeficiency virus [HIV] disease: Secondary | ICD-10-CM

## 2011-06-24 NOTE — Progress Notes (Signed)
Patient ID: Daisy Salinas, female   DOB: [birth year], 51 y.o.   MRN: [id number]     [facility] for Infectious Disease  Patient Active Problem List  Diagnoses  . HIV INFECTION  . HEPATITIS C  . SYPHILIS, LATE, LATENT  . DYSLIPIDEMIA  . CIGARETTE SMOKER  . SUBSTANCE ABUSE, MULTIPLE  . DEPRESSION  . HYPERTENSION  . BRONCHITIS, CHRONIC  . CONSTIPATION, CHRONIC  . PROLAPSE, VAGINAL WALL NOS  . MENORRHAGIA  . LOW BACK PAIN, ACUTE  . DE QUERVAIN'S TENOSYNOVITIS, LEFT WRIST  . INSOMNIA  . HYSTERECTOMY, HX OF  . Hypertensive urgency  . Shoulder pain  . Chest pain    Patient's Medications  New Prescriptions   No medications on file  Previous Medications   ALBUTEROL (PROVENTIL HFA) 108 (90 BASE) MCG/ACT INHALER    Inhale 2 puffs into the lungs every 4 (four) hours as needed. As needed for shortness of breath.   EFAVIRENZ-EMTRICTABINE-TENOFOVIR (ATRIPLA) 600-200-300 MG PER TABLET    Take 1 tablet by mouth daily with breakfast.   HYDROCHLOROTHIAZIDE (HYDRODIURIL) 25 MG TABLET    Take 1 tablet (25 mg total) by mouth daily.   LISINOPRIL (PRINIVIL,ZESTRIL) 20 MG TABLET    Take 1 tablet (20 mg total) by mouth daily.   NITROGLYCERIN (NITROSTAT) 0.4 MG SL TABLET    Place 1 tablet (0.4 mg total) under the tongue every 5 (five) minutes x 3 doses as needed for chest pain.   POTASSIUM CHLORIDE SA (K-DUR,KLOR-CON) 20 MEQ TABLET    Take 1 tablet (20 mEq total) by mouth daily.   QUETIAPINE (SEROQUEL) 100 MG TABLET    Take 100 mg by mouth at bedtime.     ZOLPIDEM (AMBIEN) 10 MG TABLET    Take 10 mg by mouth at bedtime as needed.  Modified Medications   No medications on file  Discontinued Medications   No medications on file    Subjective: Daisy Salinas is in for her routine visit. She has not missed any doses of her Atripla since her last visit. She had a left knee injury at work and is awaiting surgery by Dr. Beverely Low. She has been under stress recently because her husband, Daisy Salinas, was  hospitalized last month with a left brain stroke. He is recovering slowly at home. She has been using nicotine patches and is down to 4 cigarettes daily with the hopes that she can quit soon. She and her husband are currently seeing a counselor and finds that that is helping with their stress.  Objective: Temp: 97.8 F (36.6 C) (06/03 1404) Temp src: Oral (06/03 1404) BP: 134/90 mmHg (06/03 1404) Pulse Rate: 65  (06/03 1404)  General: She is in good spirits Skin: No rash Oral: Clear Lungs: Clear Cor: Regular S1 and S2 no murmurs Abdomen: Nontender  Lab Results HIV 1 RNA Quant (copies/mL)  Date Value  06/04/2011 <20   12/27/2010 73*  08/02/2010 <20      CD4 T Cell Abs (cmm)  Date Value  06/04/2011 1180   12/27/2010 1390   08/02/2010 1100      Assessment: Her HIV infection remains under excellent control. I will continue her Atripla.  I encouraged her to stick with her plan to quit smoking cigarettes.  Her blood pressure remains slightly elevated.  Plan: 1. Continue current medications 2. Followup after lab work in 6 months   Cliffton Asters, MD [facility] for Infectious Disease [facility] [phone] pager   [phone] cell 06/24/2011, 2:26 PM

## 2011-06-28 ENCOUNTER — Other Ambulatory Visit: Payer: Self-pay | Admitting: *Deleted

## 2011-06-28 DIAGNOSIS — B20 Human immunodeficiency virus [HIV] disease: Secondary | ICD-10-CM

## 2011-06-28 MED ORDER — EFAVIRENZ-EMTRICITAB-TENOFOVIR 600-200-300 MG PO TABS: 1.0000 | ORAL_TABLET | Freq: Every day | ORAL | Status: DC

## 2011-08-23 ENCOUNTER — Other Ambulatory Visit: Payer: Self-pay | Admitting: *Deleted

## 2011-08-23 DIAGNOSIS — I10 Essential (primary) hypertension: Secondary | ICD-10-CM

## 2011-08-23 MED ORDER — LISINOPRIL 20 MG PO TABS: 20.0000 mg | ORAL_TABLET | Freq: Every day | ORAL | Status: DC

## 2011-08-23 MED ORDER — HYDROCHLOROTHIAZIDE 25 MG PO TABS: 25.0000 mg | ORAL_TABLET | Freq: Every day | ORAL | Status: DC

## 2011-09-18 ENCOUNTER — Encounter: Payer: Self-pay | Admitting: Infectious Disease

## 2011-11-05 ENCOUNTER — Ambulatory Visit (INDEPENDENT_AMBULATORY_CARE_PROVIDER_SITE_OTHER): Payer: Self-pay | Admitting: *Deleted

## 2011-11-05 DIAGNOSIS — Z23 Encounter for immunization: Secondary | ICD-10-CM

## 2011-12-10 ENCOUNTER — Other Ambulatory Visit (INDEPENDENT_AMBULATORY_CARE_PROVIDER_SITE_OTHER): Payer: Self-pay

## 2011-12-10 DIAGNOSIS — B20 Human immunodeficiency virus [HIV] disease: Secondary | ICD-10-CM

## 2011-12-11 LAB — T-HELPER CELL (CD4) - (RCID CLINIC ONLY): CD4 % Helper T Cell: 41 % (ref 33–55)

## 2011-12-12 LAB — HIV-1 RNA QUANT-NO REFLEX-BLD: HIV 1 RNA Quant: 21 copies/mL (ref <20)

## 2011-12-24 ENCOUNTER — Ambulatory Visit (INDEPENDENT_AMBULATORY_CARE_PROVIDER_SITE_OTHER): Payer: Self-pay | Admitting: Internal Medicine

## 2011-12-24 ENCOUNTER — Encounter: Payer: Self-pay | Admitting: Internal Medicine

## 2011-12-24 VITALS — BP 137/87 | HR 74 | Temp 98.5°F | Ht 59.0 in | Wt 135.2 lb

## 2011-12-24 DIAGNOSIS — B20 Human immunodeficiency virus [HIV] disease: Secondary | ICD-10-CM

## 2011-12-24 MED ORDER — ALBUTEROL SULFATE HFA 108 (90 BASE) MCG/ACT IN AERS: 2.0000 | INHALATION_SPRAY | RESPIRATORY_TRACT | Status: DC | PRN

## 2011-12-24 NOTE — Progress Notes (Signed)
Patient ID: Daisy Salinas, female   DOB: 1960/07/26, 51 y.o.   MRN: [id number]     [facility] for Infectious Disease  Patient Active Problem List  Diagnosis  . HIV INFECTION  . HEPATITIS C  . SYPHILIS, LATE, LATENT  . DYSLIPIDEMIA  . CIGARETTE SMOKER  . SUBSTANCE ABUSE, MULTIPLE  . DEPRESSION  . HYPERTENSION  . BRONCHITIS, CHRONIC  . CONSTIPATION, CHRONIC  . PROLAPSE, VAGINAL WALL NOS  . MENORRHAGIA  . LOW BACK PAIN, ACUTE  . DE QUERVAIN'S TENOSYNOVITIS, LEFT WRIST  . INSOMNIA  . HYSTERECTOMY, HX OF  . Hypertensive urgency  . Shoulder pain  . Chest pain    Patient's Medications  New Prescriptions   No medications on file  Previous Medications   EFAVIRENZ-EMTRICTABINE-TENOFOVIR (ATRIPLA) 600-200-300 MG PER TABLET    Take 1 tablet by mouth daily with breakfast.   HYDROCHLOROTHIAZIDE (HYDRODIURIL) 25 MG TABLET    Take 1 tablet (25 mg total) by mouth daily.   LISINOPRIL (PRINIVIL,ZESTRIL) 20 MG TABLET    Take 1 tablet (20 mg total) by mouth daily.   NITROGLYCERIN (NITROSTAT) 0.4 MG SL TABLET    Place 1 tablet (0.4 mg total) under the tongue every 5 (five) minutes x 3 doses as needed for chest pain.   QUETIAPINE (SEROQUEL) 100 MG TABLET    Take 100 mg by mouth at bedtime.     ZOLPIDEM (AMBIEN) 10 MG TABLET    Take 10 mg by mouth at bedtime as needed.  Modified Medications   Modified Medication Previous Medication   ALBUTEROL (PROVENTIL HFA) 108 (90 BASE) MCG/ACT INHALER albuterol (PROVENTIL HFA) 108 (90 BASE) MCG/ACT inhaler      Inhale 2 puffs into the lungs every 4 (four) hours as needed. As needed for shortness of breath.    Inhale 2 puffs into the lungs every 4 (four) hours as needed. As needed for shortness of breath.  Discontinued Medications   POTASSIUM CHLORIDE SA (K-DUR,KLOR-CON) 20 MEQ TABLET    Take 1 tablet (20 mEq total) by mouth daily.    Subjective: Thayer is in for her routine visit. She recalls missing one dose of her Atripla since her last visit. That  occurred last week. She states that she must always take her Atripla with something sweet and did not have anything so she did not take her Atripla one night last week. She continues to smoke cigarettes. She had successful left knee surgery recently and is now back at work.  Objective: Temp: 98.5 F (36.9 C) (12/03 0944) Temp src: Oral (12/03 0944) BP: 137/87 mmHg (12/03 0944) Pulse Rate: 74  (12/03 0944)  General: She is in good spirits Skin: No rash Lungs: Clear Cor: Regular S1 and S2 no murmurs  Lab Results HIV 1 RNA Quant (copies/mL)  Date Value  12/10/2011 21   06/04/2011 <20   12/27/2010 73*     CD4 T Cell Abs (cmm)  Date Value  12/10/2011 1370   06/04/2011 1180   12/27/2010 1390      Assessment: Her HIV infection remains under excellent control. Encouraged her to not Miss a single dose of her Atripla.  I talked to her again about her cigarette smoking encouraged her to come up with a plan to quit completely.  Plan: 1. Continue Atripla 2. Cigarette cessation counseling 3. Follow up after lab work in 6 months   Cliffton Asters, MD [facility] for Infectious Disease [facility] [phone] pager   [phone] cell 12/24/2011, 10:09 AM

## 2012-02-21 ENCOUNTER — Other Ambulatory Visit: Payer: Self-pay | Admitting: Licensed Clinical Social Worker

## 2012-02-21 ENCOUNTER — Other Ambulatory Visit: Payer: Self-pay | Admitting: Internal Medicine

## 2012-02-21 DIAGNOSIS — I10 Essential (primary) hypertension: Secondary | ICD-10-CM

## 2012-02-21 MED ORDER — HYDROCHLOROTHIAZIDE 25 MG PO TABS: 25.0000 mg | ORAL_TABLET | Freq: Every day | ORAL | Status: DC

## 2012-03-23 ENCOUNTER — Other Ambulatory Visit: Payer: Self-pay | Admitting: Internal Medicine

## 2012-06-18 ENCOUNTER — Other Ambulatory Visit: Payer: Self-pay | Admitting: *Deleted

## 2012-06-18 DIAGNOSIS — B20 Human immunodeficiency virus [HIV] disease: Secondary | ICD-10-CM

## 2012-06-18 MED ORDER — EFAVIRENZ-EMTRICITAB-TENOFOVIR 600-200-300 MG PO TABS: 1.0000 | ORAL_TABLET | Freq: Every day | ORAL | Status: DC

## 2012-06-18 NOTE — Progress Notes (Signed)
Printed for ADAP application. 

## 2012-06-23 ENCOUNTER — Other Ambulatory Visit: Payer: Self-pay

## 2012-06-23 ENCOUNTER — Other Ambulatory Visit: Payer: Self-pay | Admitting: Internal Medicine

## 2012-06-23 DIAGNOSIS — B20 Human immunodeficiency virus [HIV] disease: Secondary | ICD-10-CM

## 2012-06-24 LAB — CBC
MCH: 32.8 pg (ref 26.0–34.0)
Platelets: 148 10*3/uL — ABNORMAL LOW (ref 150–400)
RBC: 4.3 MIL/uL (ref 3.87–5.11)

## 2012-06-24 LAB — LIPID PANEL
LDL Cholesterol: 67 mg/dL (ref 0–99)
VLDL: 34 mg/dL (ref 0–40)

## 2012-06-24 LAB — COMPREHENSIVE METABOLIC PANEL
ALT: 11 U/L (ref 0–35)
Alkaline Phosphatase: 93 U/L (ref 39–117)
CO2: 27 mEq/L (ref 19–32)
Sodium: 141 mEq/L (ref 135–145)
Total Bilirubin: 0.6 mg/dL (ref 0.3–1.2)
Total Protein: 7.4 g/dL (ref 6.0–8.3)

## 2012-06-24 LAB — HIV-1 RNA QUANT-NO REFLEX-BLD
HIV 1 RNA Quant: <20 copies/mL (ref <20)
HIV-1 RNA Quant, Log: <1.3 {Log} (ref <1.30)

## 2012-07-07 ENCOUNTER — Ambulatory Visit (INDEPENDENT_AMBULATORY_CARE_PROVIDER_SITE_OTHER): Payer: Self-pay | Admitting: Internal Medicine

## 2012-07-07 ENCOUNTER — Encounter: Payer: Self-pay | Admitting: Internal Medicine

## 2012-07-07 VITALS — BP 129/83 | HR 87 | Temp 100.2°F | Ht 59.0 in | Wt 139.0 lb

## 2012-07-07 DIAGNOSIS — B20 Human immunodeficiency virus [HIV] disease: Secondary | ICD-10-CM

## 2012-07-07 NOTE — Progress Notes (Signed)
Patient ID: Daisy Salinas, female   DOB: 24-Jan-1960, 52 y.o.   MRN: [id number]         [facility] for Infectious Disease  Patient Active Problem List   Diagnosis Date Noted  . SUBSTANCE ABUSE, MULTIPLE 11/30/2009  . BRONCHITIS, CHRONIC 01/03/2009  . CONSTIPATION, CHRONIC 05/09/2008  . DE QUERVAIN'S TENOSYNOVITIS, LEFT WRIST 05/09/2008  . HEPATITIS C 09/08/2007  . HYSTERECTOMY, HX OF 05/13/2006  . DEPRESSION 05/06/2006  . HIV INFECTION 10/29/2005  . SYPHILIS, LATE, LATENT 10/29/2005  . DYSLIPIDEMIA 10/29/2005  . CIGARETTE SMOKER 10/29/2005  . HYPERTENSION 10/29/2005  . PROLAPSE, VAGINAL WALL NOS 10/29/2005  . MENORRHAGIA 10/29/2005  . INSOMNIA 10/29/2005    Patient's Medications  New Prescriptions   No medications on file  Previous Medications   ALBUTEROL (PROVENTIL HFA) 108 (90 BASE) MCG/ACT INHALER    Inhale 2 puffs into the lungs every 4 (four) hours as needed. As needed for shortness of breath.   EFAVIRENZ-EMTRICITABINE-TENOFOVIR (ATRIPLA) 600-200-300 MG PER TABLET    Take 1 tablet by mouth daily with breakfast.   HYDROCHLOROTHIAZIDE (HYDRODIURIL) 25 MG TABLET    Take 1 tablet (25 mg total) by mouth daily.   LISINOPRIL (PRINIVIL,ZESTRIL) 20 MG TABLET    TAKE 1 TABLET BY MOUTH EVERY DAY   NITROGLYCERIN (NITROSTAT) 0.4 MG SL TABLET    Place 1 tablet (0.4 mg total) under the tongue every 5 (five) minutes x 3 doses as needed for chest pain.   QUETIAPINE (SEROQUEL) 100 MG TABLET    Take 100 mg by mouth at bedtime.     ZOLPIDEM (AMBIEN) 10 MG TABLET    Take 10 mg by mouth at bedtime as needed.  Modified Medications   No medications on file  Discontinued Medications   No medications on file    Subjective: Daisy Salinas is in for her routine visit. She has not missed any doses of her Atripla. She says that she is doing better but she is still smoking cigarettes.  Review of Systems: Pertinent items are noted in HPI.  Past Medical History  Diagnosis Date  . HIV (human  immunodeficiency virus infection)   . Hypertension   . Asthma   . TB (pulmonary tuberculosis) 1989    was exposed and treated  . UTI (urinary tract infection) 1997  . Depression   . Bipolar 1 disorder 2009  . GERD (gastroesophageal reflux disease)     History  Substance Use Topics  . Smoking status: Current Every Day Smoker -- 0.30 packs/day for 40 years    Types: Cigarettes  . Smokeless tobacco: Never Used     Comment: wanting "some pills" to help her stop  . Alcohol Use: No     Comment: not ETOH since June 5th    Family History  Problem Relation Age of Onset  . Coronary artery disease Mother   . Leukemia Father     No Known Allergies  Objective: Temp: 100.2 F (37.9 C) (06/17 1428) Temp src: Oral (06/17 1428) BP: 129/83 mmHg (06/17 1428) Pulse Rate: 87 (06/17 1428)  General: She is in good spirits Oral: No oropharyngeal lesions Skin: No rash Lungs: Clear Cor: Regular S1 and S2 no murmurs Abdomen: Obese, soft nontender Mood and affect: Normal  Lab Results HIV 1 RNA Quant (copies/mL)  Date Value  06/23/2012 <20   12/10/2011 21   06/04/2011 <20      CD4 T Cell Abs (cmm)  Date Value  06/23/2012 1260   12/10/2011 1370   06/04/2011  1180      Assessment: Her infection remains under excellent control. I will continue Atripla.  I have encouraged her husband to try quitting cigarettes again.  Plan: 1. Continue Atripla 2. Cigarette cessation counseling. I encouraged her to call the [place] quit line again. 3. Followup after lab work in 6 months   Cliffton Asters, MD [facility] for Infectious Disease [facility] [phone] pager   [phone] cell 07/07/2012, 3:12 PM  ectious Disease [facility] [phone] pager   [phone] cell 07/07/2012, 12:01 PM

## 2012-07-07 NOTE — Addendum Note (Signed)
Addended by: Jennet Maduro D on: 07/07/2012 04:14 PM   Modules accepted: Orders

## 2012-09-30 ENCOUNTER — Other Ambulatory Visit: Payer: Self-pay | Admitting: *Deleted

## 2012-09-30 DIAGNOSIS — I10 Essential (primary) hypertension: Secondary | ICD-10-CM

## 2012-09-30 DIAGNOSIS — B20 Human immunodeficiency virus [HIV] disease: Secondary | ICD-10-CM

## 2012-09-30 DIAGNOSIS — R0602 Shortness of breath: Secondary | ICD-10-CM

## 2012-09-30 MED ORDER — EFAVIRENZ-EMTRICITAB-TENOFOVIR 600-200-300 MG PO TABS: 1.0000 | ORAL_TABLET | Freq: Every day | ORAL | Status: DC

## 2012-09-30 MED ORDER — HYDROCHLOROTHIAZIDE 25 MG PO TABS: 25.0000 mg | ORAL_TABLET | Freq: Every day | ORAL | Status: DC

## 2012-09-30 MED ORDER — LISINOPRIL 20 MG PO TABS: ORAL_TABLET | ORAL | Status: DC

## 2012-09-30 MED ORDER — ALBUTEROL SULFATE HFA 108 (90 BASE) MCG/ACT IN AERS: 2.0000 | INHALATION_SPRAY | RESPIRATORY_TRACT | Status: DC | PRN

## 2012-10-01 ENCOUNTER — Other Ambulatory Visit: Payer: Self-pay | Admitting: *Deleted

## 2012-10-01 DIAGNOSIS — B20 Human immunodeficiency virus [HIV] disease: Secondary | ICD-10-CM

## 2012-10-01 MED ORDER — EFAVIRENZ-EMTRICITAB-TENOFOVIR 600-200-300 MG PO TABS: 1.0000 | ORAL_TABLET | Freq: Every day | ORAL | Status: DC

## 2012-10-30 ENCOUNTER — Other Ambulatory Visit: Payer: Self-pay | Admitting: *Deleted

## 2012-10-30 DIAGNOSIS — B20 Human immunodeficiency virus [HIV] disease: Secondary | ICD-10-CM

## 2012-10-30 MED ORDER — EFAVIRENZ-EMTRICITAB-TENOFOVIR 600-200-300 MG PO TABS: 1.0000 | ORAL_TABLET | Freq: Every day | ORAL | Status: DC

## 2012-11-10 ENCOUNTER — Other Ambulatory Visit: Payer: Self-pay | Admitting: *Deleted

## 2012-11-10 DIAGNOSIS — B20 Human immunodeficiency virus [HIV] disease: Secondary | ICD-10-CM

## 2012-11-10 MED ORDER — EFAVIRENZ-EMTRICITAB-TENOFOVIR 600-200-300 MG PO TABS: 1.0000 | ORAL_TABLET | Freq: Every day | ORAL | Status: DC

## 2012-12-02 ENCOUNTER — Other Ambulatory Visit: Payer: Self-pay | Admitting: *Deleted

## 2012-12-02 DIAGNOSIS — B20 Human immunodeficiency virus [HIV] disease: Secondary | ICD-10-CM

## 2012-12-02 MED ORDER — EFAVIRENZ-EMTRICITAB-TENOFOVIR 600-200-300 MG PO TABS: 1.0000 | ORAL_TABLET | Freq: Every day | ORAL | Status: DC

## 2012-12-29 ENCOUNTER — Other Ambulatory Visit: Payer: Self-pay

## 2013-01-04 ENCOUNTER — Ambulatory Visit (INDEPENDENT_AMBULATORY_CARE_PROVIDER_SITE_OTHER): Payer: Self-pay | Admitting: *Deleted

## 2013-01-04 ENCOUNTER — Other Ambulatory Visit (INDEPENDENT_AMBULATORY_CARE_PROVIDER_SITE_OTHER): Payer: Self-pay

## 2013-01-04 ENCOUNTER — Ambulatory Visit: Payer: Self-pay

## 2013-01-04 DIAGNOSIS — B20 Human immunodeficiency virus [HIV] disease: Secondary | ICD-10-CM

## 2013-01-04 DIAGNOSIS — Z23 Encounter for immunization: Secondary | ICD-10-CM

## 2013-01-04 LAB — COMPREHENSIVE METABOLIC PANEL
ALT: 10 U/L (ref 0–35)
AST: 49 U/L — ABNORMAL HIGH (ref 0–37)
Alkaline Phosphatase: 74 U/L (ref 39–117)
BUN: 14 mg/dL (ref 6–23)
Calcium: 9.1 mg/dL (ref 8.4–10.5)
Chloride: 105 mEq/L (ref 96–112)
Creat: 0.67 mg/dL (ref 0.50–1.10)
Total Bilirubin: 0.4 mg/dL (ref 0.3–1.2)

## 2013-01-04 LAB — CBC
HCT: 38.1 % (ref 36.0–46.0)
MCH: 35.7 pg — ABNORMAL HIGH (ref 26.0–34.0)
MCHC: 36.5 g/dL — ABNORMAL HIGH (ref 30.0–36.0)
MCV: 97.9 fL (ref 78.0–100.0)
Platelets: 123 10*3/uL — ABNORMAL LOW (ref 150–400)
RDW: 14 % (ref 11.5–15.5)
WBC: 5.5 10*3/uL (ref 4.0–10.5)

## 2013-01-05 LAB — HIV-1 RNA QUANT-NO REFLEX-BLD: HIV 1 RNA Quant: 42 copies/mL — ABNORMAL HIGH (ref <20)

## 2013-01-12 ENCOUNTER — Ambulatory Visit: Payer: Self-pay | Admitting: Internal Medicine

## 2013-01-28 ENCOUNTER — Encounter: Payer: Self-pay | Admitting: Internal Medicine

## 2013-01-28 ENCOUNTER — Ambulatory Visit: Payer: Self-pay

## 2013-01-28 ENCOUNTER — Ambulatory Visit (INDEPENDENT_AMBULATORY_CARE_PROVIDER_SITE_OTHER): Payer: Self-pay | Admitting: Internal Medicine

## 2013-01-28 ENCOUNTER — Other Ambulatory Visit: Payer: Self-pay | Admitting: *Deleted

## 2013-01-28 VITALS — BP 129/87 | HR 84 | Temp 98.3°F | Ht 59.0 in | Wt 164.2 lb

## 2013-01-28 DIAGNOSIS — B20 Human immunodeficiency virus [HIV] disease: Secondary | ICD-10-CM

## 2013-01-28 MED ORDER — EFAVIRENZ-EMTRICITAB-TENOFOVIR 600-200-300 MG PO TABS: 1.0000 | ORAL_TABLET | Freq: Every day | ORAL | Status: DC

## 2013-01-28 NOTE — Progress Notes (Signed)
Patient ID: Daisy Salinas, female   DOB: 1960/03/10, 53 y.o.   MRN: [id number]          [facility] for [facility] Disease  Patient Active Problem List   Diagnosis Date Noted  . SUBSTANCE ABUSE, MULTIPLE 11/30/2009  . BRONCHITIS, CHRONIC 01/03/2009  . CONSTIPATION, CHRONIC 05/09/2008  . DE QUERVAIN'S TENOSYNOVITIS, LEFT WRIST 05/09/2008  . HEPATITIS C 09/08/2007  . HYSTERECTOMY, HX OF 05/13/2006  . DEPRESSION 05/06/2006  . HIV INFECTION 10/29/2005  . SYPHILIS, LATE, LATENT 10/29/2005  . DYSLIPIDEMIA 10/29/2005  . CIGARETTE SMOKER 10/29/2005  . HYPERTENSION 10/29/2005  . PROLAPSE, VAGINAL WALL NOS 10/29/2005  . MENORRHAGIA 10/29/2005  . INSOMNIA 10/29/2005    Patient's Medications  New Prescriptions   No medications on file  Previous Medications   ALBUTEROL (PROVENTIL HFA) 108 (90 BASE) MCG/ACT INHALER    Inhale 2 puffs into the lungs every 4 (four) hours as needed. As needed for shortness of breath.   DIVALPROEX (DEPAKOTE) 500 MG DR TABLET    Take 500 mg by mouth 2 (two) times daily. Take one tablet by mouth in the morning and 2 tablets by mouth each evening   EFAVIRENZ-EMTRICITABINE-TENOFOVIR (ATRIPLA) 600-200-300 MG PER TABLET    Take 1 tablet by mouth daily with breakfast.   HYDROCHLOROTHIAZIDE (HYDRODIURIL) 25 MG TABLET    Take 1 tablet (25 mg total) by mouth daily.   LISINOPRIL (PRINIVIL,ZESTRIL) 20 MG TABLET    TAKE 1 TABLET BY MOUTH EVERY DAY   NITROGLYCERIN (NITROSTAT) 0.4 MG SL TABLET    Place 1 tablet (0.4 mg total) under the tongue every 5 (five) minutes x 3 doses as needed for chest pain.   QUETIAPINE (SEROQUEL) 100 MG TABLET    Take 100 mg by mouth at bedtime.    Modified Medications   No medications on file  Discontinued Medications   ZOLPIDEM (AMBIEN) 10 MG TABLET    Take 10 mg by mouth at bedtime as needed.    Subjective: Daisy Salinas is in for her routine visit. Overall she is feeling much better. She's been completely sober on and off of cocaine. As a  result she has gained quite a bit of weight. She has not missed any doses of her Atripla, blood pressure medications and is faithfully taking her Seroquel and Depakote. She continues to followup with counseling and [facility]. She and her husband, [name], or trying to quit smoking cigarettes but have not set a quit date. She has had some problems with a few cramps in her right arm that occurred while she was peeling potatoes and on 1 occasion a cramp in her left foot.  Review of Systems: Pertinent items are noted in HPI.  Past Medical History  Diagnosis Date  . HIV (human immunodeficiency virus infection)   . Hypertension   . Asthma   . TB (pulmonary tuberculosis) 1989    was exposed and treated  . UTI (urinary tract infection) 1997  . Depression   . Bipolar 1 disorder 2009  . GERD (gastroesophageal reflux disease)     History  Substance Use Topics  . Smoking status: Current Every Day Smoker -- 0.30 packs/day for 40 years    Types: Cigarettes  . Smokeless tobacco: Never Used     Comment: wanting "some pills" to help her stop  . Alcohol Use: No     Comment: not ETOH since June 5th    Family History  Problem Relation Age of Onset  . Coronary artery disease Mother   .  Leukemia Father     No Known Allergies  Objective: Temp: 98.3 F (36.8 C) (01/08 1107) Temp src: Oral (01/08 1107) BP: 129/87 mmHg (01/08 1107) Pulse Rate: 84 (01/08 1107)  General: Her weight is up to 169 pounds Oral: no oropharyngeal lesions Skin: no rash Lungs: clear Cor: regular S1 and S2 with no murmurs Abdomen: nontender Joints and extremities: normal Neuro: alert with normal speech and conversation Mood and affect: Normal and bright  Lab Results Lab Results  Component Value Date   WBC 5.5 01/04/2013   HGB 13.9 01/04/2013   HCT 38.1 01/04/2013   MCV 97.9 01/04/2013   PLT 123* 01/04/2013    Lab Results  Component Value Date   CREATININE 0.67 01/04/2013   BUN 14 01/04/2013   NA 136  01/04/2013   K 3.7 01/04/2013   CL 105 01/04/2013   CO2 23 01/04/2013    Lab Results  Component Value Date   ALT 10 01/04/2013   AST 49* 01/04/2013   ALKPHOS 74 01/04/2013   BILITOT 0.4 01/04/2013    Lab Results  Component Value Date   CHOL 138 06/23/2012   HDL 37* 06/23/2012   LDLCALC 67 06/23/2012   TRIG 169* 06/23/2012   CHOLHDL 3.7 06/23/2012    Lab Results HIV 1 RNA Quant (copies/mL)  Date Value  01/04/2013 42*  06/23/2012 <20   12/10/2011 21      CD4 T Cell Abs (/uL)  Date Value  01/04/2013 1407   06/23/2012 1260   12/10/2011 1370      Assessment: She's had slight elevation in her viral load but overall her HIV infection remains under very good control. Will continue Atripla.  Her blood pressure is well controlled.  Talk to her about the importance of trying to set a quit date and quitting smoking cigarettes completely.  I do not know the cause of her recent cramps but doubt this is anything significant.  With her weight gain she is at greater risk of metabolic syndrome. We'll simply monitor her weight, glucose and lipids at her next visit.  Plan: 1. Continue Atripla and current antihypertensive agents 2. I reinforced maintaining her sobriety 3. Cigarette cessation counseling provided 4. Lifestyle modification counseling provided 5. Followup after lab work in 6 months   Cliffton Asters, MD [facility] for Infectious Disease [facility] [phone] pager   [phone] cell 01/28/2013, 11:34 AM

## 2013-02-15 ENCOUNTER — Other Ambulatory Visit: Payer: Self-pay

## 2013-02-23 ENCOUNTER — Other Ambulatory Visit: Payer: Self-pay

## 2013-03-02 ENCOUNTER — Ambulatory Visit: Payer: Self-pay | Admitting: Internal Medicine

## 2013-05-26 ENCOUNTER — Encounter: Payer: Self-pay | Admitting: Licensed Clinical Social Worker

## 2013-05-26 NOTE — Progress Notes (Signed)
CM-Faun Mcqueen assigned to CT until 06/2013

## 2013-05-29 IMAGING — CR DG KNEE COMPLETE 4+V*L*
5 series · 5 of 5 positions shown · non-contrast
Comparison: None.

CLINICAL DATA: Blow to the knee.  Anterior knee pain.

LEFT KNEE - COMPLETE 4+ VIEW

[view not recorded (1 of 5)]
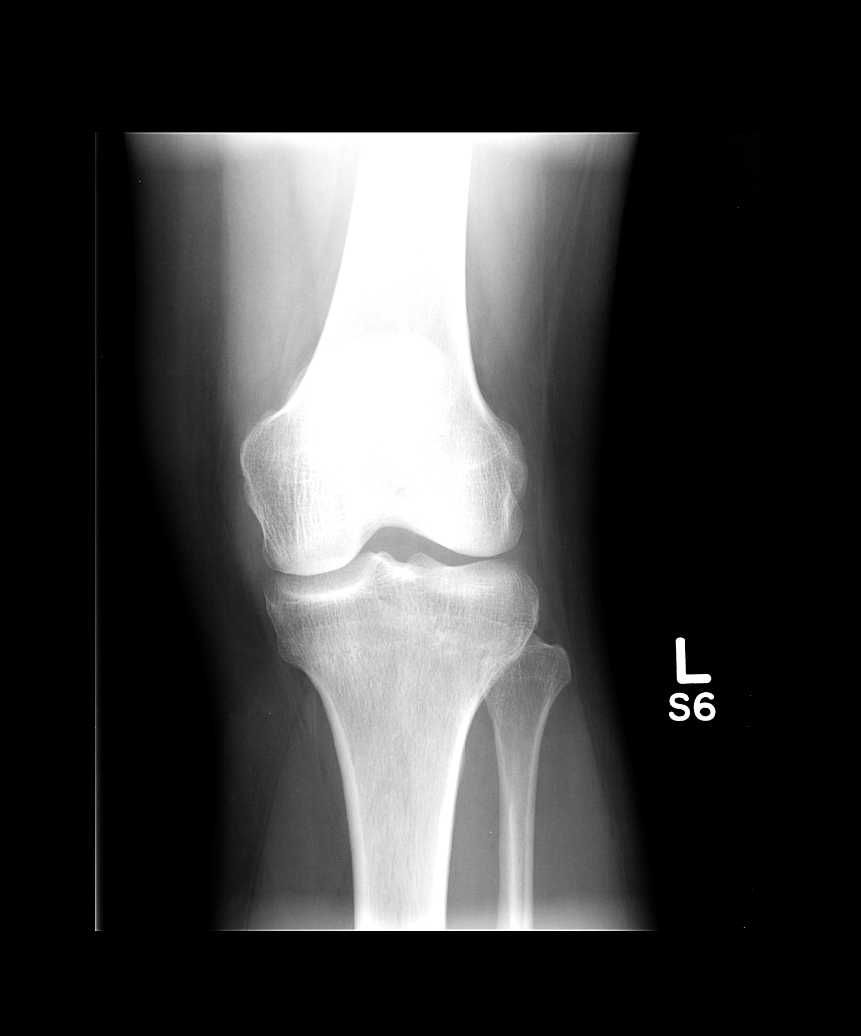

[view not recorded (2 of 5)]
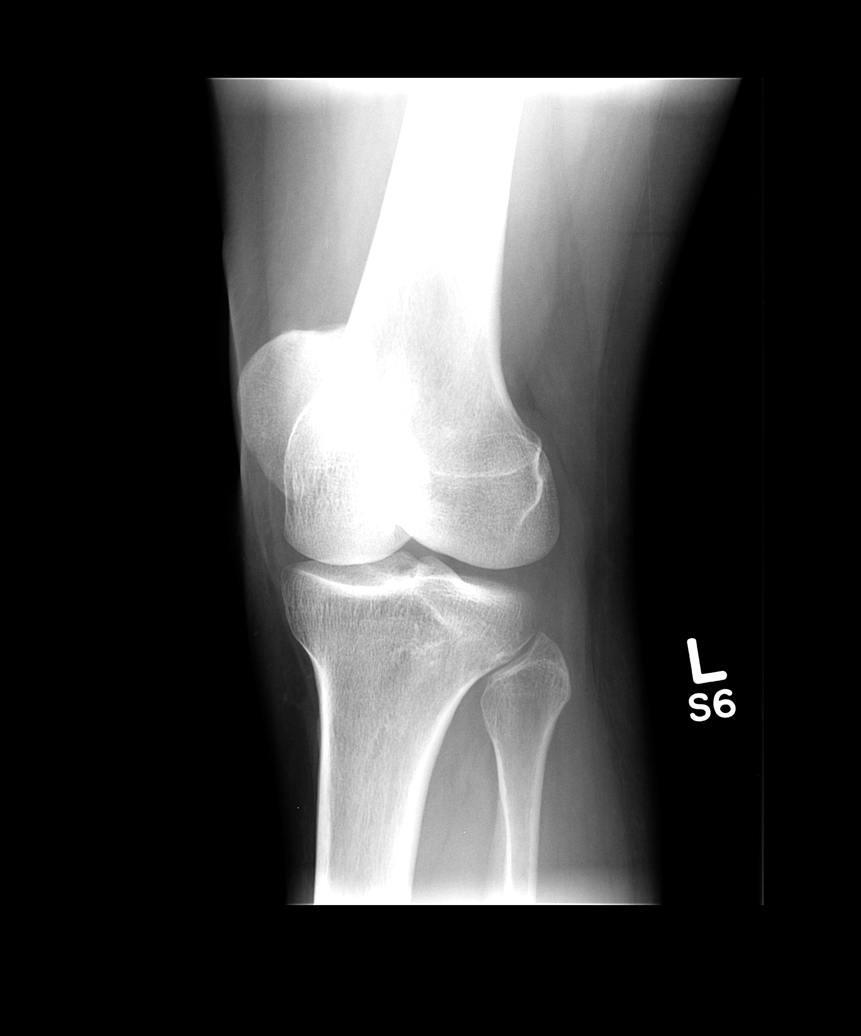

[view not recorded (3 of 5)]
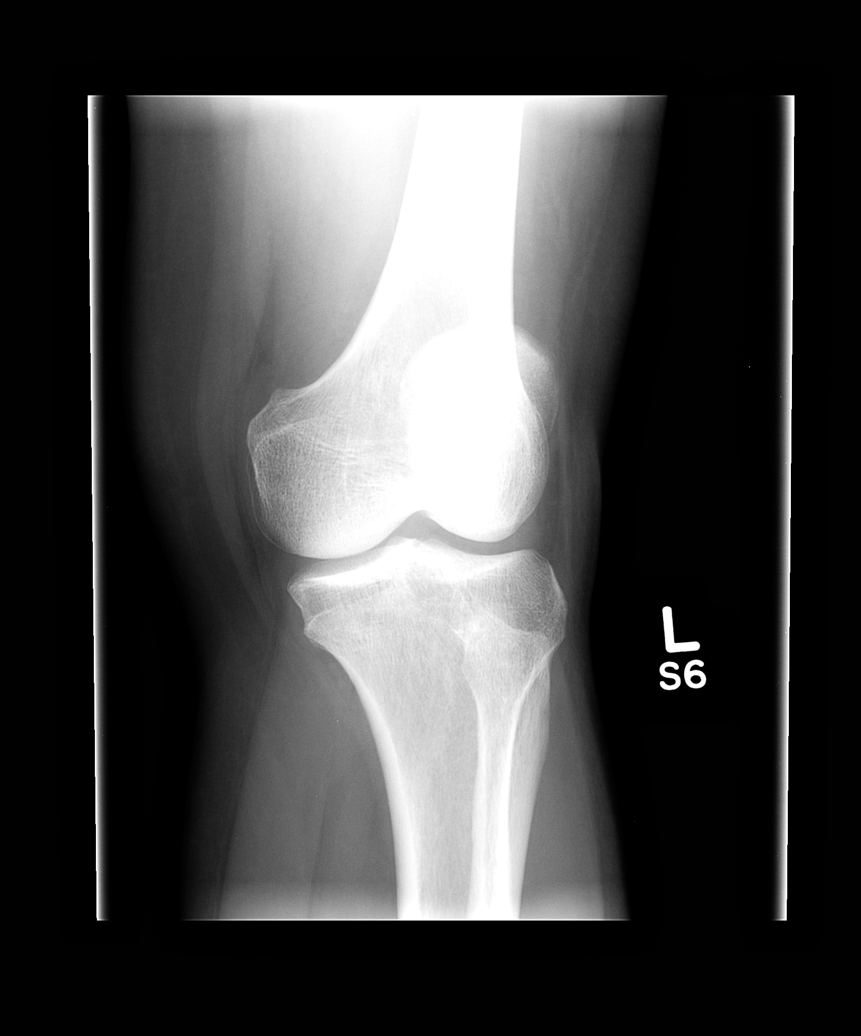

[view not recorded (4 of 5)]
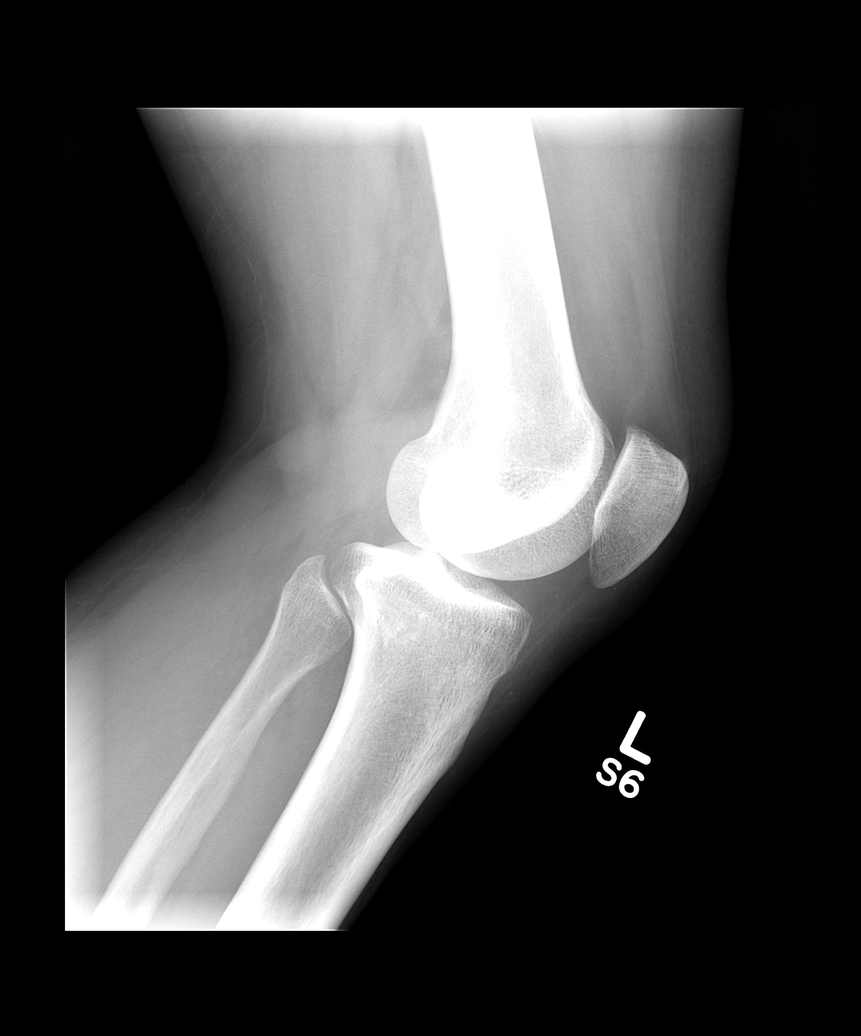

[view not recorded (5 of 5)]
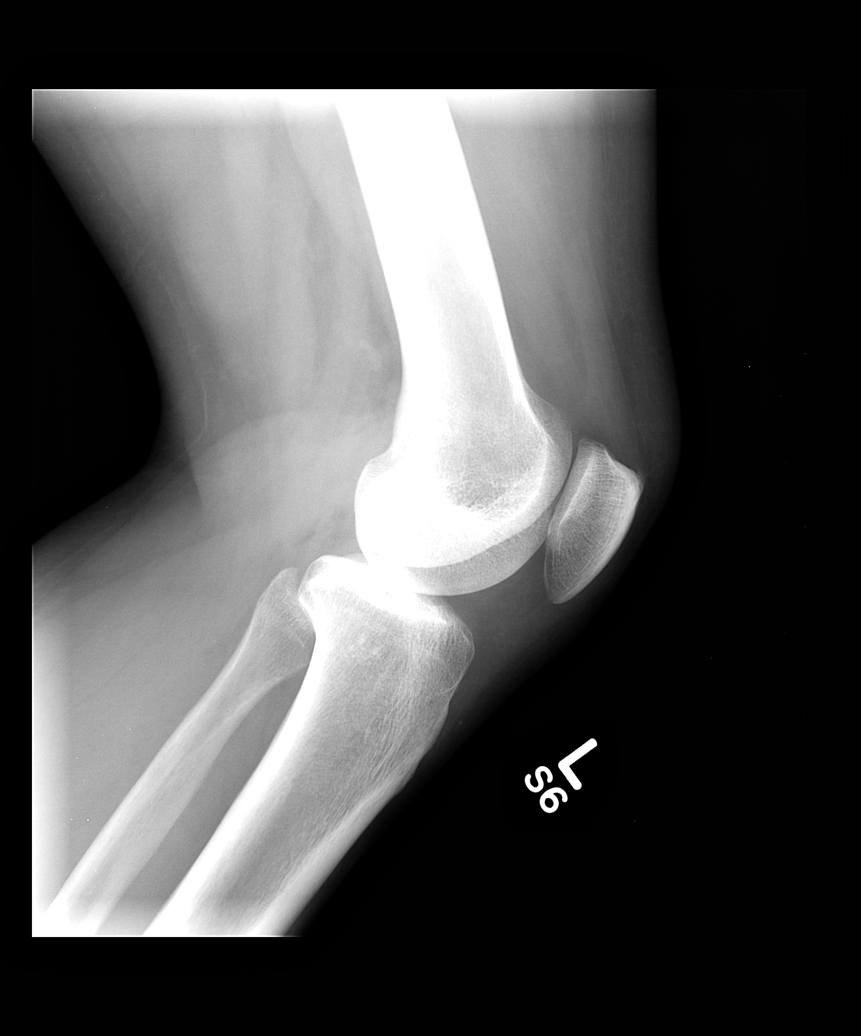

[5 of 5 positions shown; findings below may reference images not displayed]

FINDINGS: Imaged bones, joints and soft tissues appear normal.
IMPRESSION: Negative exam.

## 2013-07-01 ENCOUNTER — Encounter: Payer: Self-pay | Admitting: Licensed Clinical Social Worker

## 2013-07-01 NOTE — Progress Notes (Signed)
Daisy Salinas, discharged CT this day from [facility] services, as she has completed all care plan goals.

## 2013-07-28 ENCOUNTER — Other Ambulatory Visit: Payer: Self-pay

## 2013-08-10 ENCOUNTER — Ambulatory Visit: Payer: Self-pay | Admitting: Internal Medicine

## 2013-08-18 ENCOUNTER — Encounter (HOSPITAL_COMMUNITY): Payer: Self-pay | Admitting: Emergency Medicine

## 2013-08-18 ENCOUNTER — Emergency Department (HOSPITAL_COMMUNITY)
Admission: EM | Admit: 2013-08-18 | Discharge: 2013-08-18 | Disposition: A | Discharge status: Home / Self Care | Payer: Self-pay | Attending: Emergency Medicine | Admitting: Emergency Medicine

## 2013-08-18 ENCOUNTER — Other Ambulatory Visit: Payer: Self-pay

## 2013-08-18 DIAGNOSIS — S0990XA Unspecified injury of head, initial encounter: Secondary | ICD-10-CM | POA: Insufficient documentation

## 2013-08-18 DIAGNOSIS — Z87440 Personal history of urinary (tract) infections: Secondary | ICD-10-CM | POA: Insufficient documentation

## 2013-08-18 DIAGNOSIS — Z21 Asymptomatic human immunodeficiency virus [HIV] infection status: Secondary | ICD-10-CM | POA: Insufficient documentation

## 2013-08-18 DIAGNOSIS — Y92410 Unspecified street and highway as the place of occurrence of the external cause: Secondary | ICD-10-CM | POA: Insufficient documentation

## 2013-08-18 DIAGNOSIS — S0993XA Unspecified injury of face, initial encounter: Secondary | ICD-10-CM | POA: Insufficient documentation

## 2013-08-18 DIAGNOSIS — J45909 Unspecified asthma, uncomplicated: Secondary | ICD-10-CM | POA: Insufficient documentation

## 2013-08-18 DIAGNOSIS — Y9389 Activity, other specified: Secondary | ICD-10-CM | POA: Insufficient documentation

## 2013-08-18 DIAGNOSIS — I10 Essential (primary) hypertension: Secondary | ICD-10-CM | POA: Insufficient documentation

## 2013-08-18 DIAGNOSIS — F17200 Nicotine dependence, unspecified, uncomplicated: Secondary | ICD-10-CM | POA: Insufficient documentation

## 2013-08-18 DIAGNOSIS — Z79899 Other long term (current) drug therapy: Secondary | ICD-10-CM | POA: Insufficient documentation

## 2013-08-18 DIAGNOSIS — Z8719 Personal history of other diseases of the digestive system: Secondary | ICD-10-CM | POA: Insufficient documentation

## 2013-08-18 DIAGNOSIS — M7918 Myalgia, other site: Secondary | ICD-10-CM

## 2013-08-18 DIAGNOSIS — S199XXA Unspecified injury of neck, initial encounter: Secondary | ICD-10-CM

## 2013-08-18 DIAGNOSIS — F319 Bipolar disorder, unspecified: Secondary | ICD-10-CM | POA: Insufficient documentation

## 2013-08-18 DIAGNOSIS — IMO0002 Reserved for concepts with insufficient information to code with codable children: Secondary | ICD-10-CM | POA: Insufficient documentation

## 2013-08-18 DIAGNOSIS — Z8611 Personal history of tuberculosis: Secondary | ICD-10-CM | POA: Insufficient documentation

## 2013-08-18 MED ORDER — NAPROXEN 375 MG PO TABS: 375.0000 mg | ORAL_TABLET | Freq: Two times a day (BID) | ORAL | Status: DC

## 2013-08-18 MED ORDER — IBUPROFEN 400 MG PO TABS
800.0000 mg | ORAL_TABLET | Freq: Once | ORAL | Status: AC
  Administered 2013-08-18: 800 mg via ORAL
  Filled 2013-08-18: qty 2

## 2013-08-18 MED ORDER — METHOCARBAMOL 500 MG PO TABS: 1000.0000 mg | ORAL_TABLET | Freq: Four times a day (QID) | ORAL | Status: DC
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
## 2013-08-18 NOTE — ED Provider Notes (Signed)
Medical screening examination/treatment/procedure(s) were performed by non-physician practitioner and as supervising physician I was immediately available for consultation/collaboration.   EKG Interpretation None        Dagmar HaitWilliam Quamir Willemsen, MD 08/18/13 1530
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
## 2013-09-08 ENCOUNTER — Ambulatory Visit: Payer: Self-pay | Admitting: Internal Medicine

## 2013-09-10 ENCOUNTER — Other Ambulatory Visit: Payer: Self-pay | Admitting: Internal Medicine

## 2013-09-20 ENCOUNTER — Other Ambulatory Visit (INDEPENDENT_AMBULATORY_CARE_PROVIDER_SITE_OTHER): Payer: Self-pay

## 2013-09-20 DIAGNOSIS — Z113 Encounter for screening for infections with a predominantly sexual mode of transmission: Secondary | ICD-10-CM

## 2013-09-20 DIAGNOSIS — Z79899 Other long term (current) drug therapy: Secondary | ICD-10-CM

## 2013-09-20 DIAGNOSIS — B20 Human immunodeficiency virus [HIV] disease: Secondary | ICD-10-CM

## 2013-09-20 LAB — COMPREHENSIVE METABOLIC PANEL
ALBUMIN: 4.2 g/dL (ref 3.5–5.2)
ALT: 16 U/L (ref 0–35)
AST: 48 U/L — ABNORMAL HIGH (ref 0–37)
Alkaline Phosphatase: 84 U/L (ref 39–117)
BILIRUBIN TOTAL: 0.8 mg/dL (ref 0.2–1.2)
BUN: 7 mg/dL (ref 6–23)
CO2: 25 mEq/L (ref 19–32)
Calcium: 9.8 mg/dL (ref 8.4–10.5)
Chloride: 103 mEq/L (ref 96–112)
Creat: 0.88 mg/dL (ref 0.50–1.10)
GLUCOSE: 101 mg/dL — AB (ref 70–99)
POTASSIUM: 3.3 meq/L — AB (ref 3.5–5.3)
SODIUM: 139 meq/L (ref 135–145)
TOTAL PROTEIN: 7.8 g/dL (ref 6.0–8.3)

## 2013-09-20 LAB — LIPID PANEL
Cholesterol: 153 mg/dL (ref 0–200)
HDL: 31 mg/dL — ABNORMAL LOW (ref >39)
LDL CALC: 85 mg/dL (ref 0–99)
Total CHOL/HDL Ratio: 4.9 Ratio
Triglycerides: 186 mg/dL — ABNORMAL HIGH (ref <150)
VLDL: 37 mg/dL (ref 0–40)

## 2013-09-20 LAB — CBC
HCT: 43 % (ref 36.0–46.0)
HEMOGLOBIN: 15.4 g/dL — AB (ref 12.0–15.0)
MCH: 33.8 pg (ref 26.0–34.0)
MCHC: 35.8 g/dL (ref 30.0–36.0)
MCV: 94.3 fL (ref 78.0–100.0)
PLATELETS: 118 10*3/uL — AB (ref 150–400)
RBC: 4.56 MIL/uL (ref 3.87–5.11)
RDW: 12.5 % (ref 11.5–15.5)
WBC: 6.6 10*3/uL (ref 4.0–10.5)

## 2013-09-20 LAB — RPR

## 2013-09-21 LAB — HIV-1 RNA QUANT-NO REFLEX-BLD
HIV 1 RNA QUANT: 3857 {copies}/mL — AB (ref <20)
HIV-1 RNA QUANT, LOG: 3.59 {Log} — AB (ref <1.30)

## 2013-09-21 LAB — T-HELPER CELL (CD4) - (RCID CLINIC ONLY)
CD4 % Helper T Cell: 41 % (ref 33–55)
CD4 T CELL ABS: 1640 /uL (ref 400–2700)

## 2013-09-28 ENCOUNTER — Ambulatory Visit: Payer: Self-pay | Admitting: Internal Medicine

## 2013-11-10 ENCOUNTER — Ambulatory Visit: Payer: Self-pay | Admitting: Internal Medicine

## 2013-12-27 ENCOUNTER — Other Ambulatory Visit (INDEPENDENT_AMBULATORY_CARE_PROVIDER_SITE_OTHER): Payer: Self-pay

## 2013-12-27 DIAGNOSIS — B20 Human immunodeficiency virus [HIV] disease: Secondary | ICD-10-CM

## 2013-12-28 LAB — COMPLETE METABOLIC PANEL WITH GFR
ALT: 13 U/L (ref 0–35)
AST: 47 U/L — ABNORMAL HIGH (ref 0–37)
Albumin: 3.9 g/dL (ref 3.5–5.2)
Alkaline Phosphatase: 89 U/L (ref 39–117)
BILIRUBIN TOTAL: 0.6 mg/dL (ref 0.2–1.2)
BUN: 8 mg/dL (ref 6–23)
CO2: 23 mEq/L (ref 19–32)
Calcium: 9.4 mg/dL (ref 8.4–10.5)
Chloride: 105 mEq/L (ref 96–112)
Creat: 0.72 mg/dL (ref 0.50–1.10)
GFR, Est African American: >89 mL/min
GLUCOSE: 89 mg/dL (ref 70–99)
Potassium: 3.8 mEq/L (ref 3.5–5.3)
Sodium: 136 mEq/L (ref 135–145)
Total Protein: 7.6 g/dL (ref 6.0–8.3)

## 2013-12-28 LAB — CBC WITH DIFFERENTIAL/PLATELET
BASOS PCT: 0 % (ref 0–1)
Basophils Absolute: 0 10*3/uL (ref 0.0–0.1)
EOS ABS: 0.1 10*3/uL (ref 0.0–0.7)
Eosinophils Relative: 1 % (ref 0–5)
HEMATOCRIT: 37.9 % (ref 36.0–46.0)
HEMOGLOBIN: 13.6 g/dL (ref 12.0–15.0)
LYMPHS ABS: 3.5 10*3/uL (ref 0.7–4.0)
Lymphocytes Relative: 55 % — ABNORMAL HIGH (ref 12–46)
MCH: 33.2 pg (ref 26.0–34.0)
MCHC: 35.9 g/dL (ref 30.0–36.0)
MCV: 92.4 fL (ref 78.0–100.0)
MONO ABS: 0.5 10*3/uL (ref 0.1–1.0)
MONOS PCT: 8 % (ref 3–12)
MPV: 12.3 fL (ref 9.4–12.4)
Neutro Abs: 2.3 10*3/uL (ref 1.7–7.7)
Neutrophils Relative %: 36 % — ABNORMAL LOW (ref 43–77)
Platelets: 118 10*3/uL — ABNORMAL LOW (ref 150–400)
RBC: 4.1 MIL/uL (ref 3.87–5.11)
RDW: 13.8 % (ref 11.5–15.5)
WBC: 6.4 10*3/uL (ref 4.0–10.5)

## 2013-12-28 LAB — T-HELPER CELL (CD4) - (RCID CLINIC ONLY)
CD4 T CELL HELPER: 36 % (ref 33–55)
CD4 T Cell Abs: 1210 /uL (ref 400–2700)

## 2013-12-29 LAB — HIV-1 RNA QUANT-NO REFLEX-BLD
HIV 1 RNA Quant: 3974 copies/mL — ABNORMAL HIGH (ref <20)
HIV-1 RNA Quant, Log: 3.6 {Log} — ABNORMAL HIGH (ref <1.30)

## 2014-01-12 ENCOUNTER — Encounter: Payer: Self-pay | Admitting: Internal Medicine

## 2014-01-12 ENCOUNTER — Ambulatory Visit (INDEPENDENT_AMBULATORY_CARE_PROVIDER_SITE_OTHER): Payer: No Typology Code available for payment source | Admitting: *Deleted

## 2014-01-12 ENCOUNTER — Ambulatory Visit (INDEPENDENT_AMBULATORY_CARE_PROVIDER_SITE_OTHER): Payer: Self-pay | Admitting: Internal Medicine

## 2014-01-12 DIAGNOSIS — B182 Chronic viral hepatitis C: Secondary | ICD-10-CM

## 2014-01-12 DIAGNOSIS — B20 Human immunodeficiency virus [HIV] disease: Secondary | ICD-10-CM

## 2014-01-12 DIAGNOSIS — Z23 Encounter for immunization: Secondary | ICD-10-CM

## 2014-01-12 NOTE — Progress Notes (Addendum)
Patient ID: Daisy Salinas, female   DOB: 1960-04-17, 53 y.o.   MRN: [id number]          Patient Active Problem List   Diagnosis Date Noted  . Chronic hepatitis C without hepatic coma 09/08/2007    Priority: High  . SUBSTANCE ABUSE, MULTIPLE 11/30/2009  . BRONCHITIS, CHRONIC 01/03/2009  . CONSTIPATION, CHRONIC 05/09/2008  . DE QUERVAIN'S TENOSYNOVITIS, LEFT WRIST 05/09/2008  . HYSTERECTOMY, HX OF 05/13/2006  . DEPRESSION 05/06/2006  . Human immunodeficiency virus (HIV) disease 10/29/2005  . SYPHILIS, LATE, LATENT 10/29/2005  . DYSLIPIDEMIA 10/29/2005  . CIGARETTE SMOKER 10/29/2005  . HYPERTENSION 10/29/2005  . PROLAPSE, VAGINAL WALL NOS 10/29/2005  . MENORRHAGIA 10/29/2005  . INSOMNIA 10/29/2005    Patient's Medications  New Prescriptions   No medications on file  Previous Medications   DIVALPROEX (DEPAKOTE) 500 MG DR TABLET    Take 500 mg by mouth 2 (two) times daily. Take one tablet by mouth in the morning and 2 tablets by mouth each evening   EFAVIRENZ-EMTRICITABINE-TENOFOVIR (ATRIPLA) 600-200-300 MG PER TABLET    Take 1 tablet by mouth daily with breakfast.   HYDROCHLOROTHIAZIDE (HYDRODIURIL) 25 MG TABLET    Take 1 tablet (25 mg total) by mouth daily.   LISINOPRIL (PRINIVIL,ZESTRIL) 20 MG TABLET    Take 20 mg by mouth daily.   METHOCARBAMOL (ROBAXIN) 500 MG TABLET    Take 2 tablets (1,000 mg total) by mouth 4 (four) times daily.   NAPROXEN (NAPROSYN) 375 MG TABLET    Take 1 tablet (375 mg total) by mouth 2 (two) times daily.  Modified Medications   No medications on file  Discontinued Medications   No medications on file    Subjective: Daisy Salinas is in for her first visit in a while. She readily admits that she has not been taking her medications. She stopped Atripla in June after she had a couple of days of abdominal cramping. She tried restarting it in September then noticed some cervical adenopathy and states that she became extremely anxious and stopped taking it  again. She has also been off her blood pressure medications. She relapsed with alcohol and cocaine use. She states that when she was using drugs she does not take care of herself. Her husband, [name], has also been using and is with her here today. They're most interested in getting sober again and back on their medications.  Review of Systems: Constitutional: positive for malaise, negative for anorexia, chills, fevers, sweats and weight loss Eyes: negative Ears, nose, mouth, throat, and face: negative Respiratory: negative Cardiovascular: negative Gastrointestinal: negative Genitourinary:negative  Past Medical History  Diagnosis Date  . HIV (human immunodeficiency virus infection)   . Hypertension   . Asthma   . TB (pulmonary tuberculosis) 1989    was exposed and treated  . UTI (urinary tract infection) 1997  . Depression   . Bipolar 1 disorder 2009  . GERD (gastroesophageal reflux disease)     History  Substance Use Topics  . Smoking status: Current Every Day Smoker -- 0.30 packs/day for 40 years    Types: Cigarettes  . Smokeless tobacco: Never Used     Comment: wanting "some pills" to help her stop  . Alcohol Use: No     Comment: not ETOH since June 5th    Family History  Problem Relation Age of Onset  . Coronary artery disease Mother   . Leukemia Father     No Known Allergies  Objective: Temp: 98.4 F (36.9 C) (  12/23 1555) Temp Source: Oral (12/23 1555) BP: 179/115 mmHg (12/23 1555) Pulse Rate: 67 (12/23 1555) Body mass index is 28.26 kg/(m^2).  General: She is very direct but, and smiling. She appears to be relieved to be entering back into care Oral: No oropharyngeal lesions Skin: No rash Lungs: Clear Cor: Regular S1 and S2 with no murmurs Abdomen: Soft and nontender  Lab Results Lab Results  Component Value Date   WBC 6.4 12/27/2013   HGB 13.6 12/27/2013   HCT 37.9 12/27/2013   MCV 92.4 12/27/2013   PLT 118* 12/27/2013    Lab Results  Component  Value Date   CREATININE 0.72 12/27/2013   BUN 8 12/27/2013   NA 136 12/27/2013   K 3.8 12/27/2013   CL 105 12/27/2013   CO2 23 12/27/2013    Lab Results  Component Value Date   ALT 13 12/27/2013   AST 47* 12/27/2013   ALKPHOS 89 12/27/2013   BILITOT 0.6 12/27/2013    Lab Results  Component Value Date   CHOL 153 09/20/2013   HDL 31* 09/20/2013   LDLCALC 85 09/20/2013   TRIG 186* 09/20/2013   CHOLHDL 4.9 09/20/2013    Lab Results HIV 1 RNA QUANT (copies/mL)  Date Value  12/27/2013 3974*  09/20/2013 3857*  01/04/2013 42*   CD4 T CELL ABS (/uL)  Date Value  12/27/2013 1210  09/20/2013 1640  01/04/2013 1407     Assessment: As expected, her viral load is reactivated her CD4 count remains normal. I will check an HIV genotype resistance assay before determining what antiretroviral regimen to start. She also needs to be recertified for ADAP.  I will recheck her hepatitis C viral load and hepatitis C genotype and consider treatment if she can maintain sobriety.  She has been sober for 4 days and is undergoing counseling at [facility].  I will have her restart her antihypertensive medication immediately.  Plan: 1. Check HIV genotype resistance assay 2. Check hepatitis C viral load and genotype 3. Recertify for ADAP 4. Continue mental health counseling 5. Follow-up in 3-4 weeks   Cliffton AstersJohn Avagrace Botelho, MD [facility] [phone] pager   [phone] cell 01/12/2014, 4:34 PM

## 2014-01-17 ENCOUNTER — Other Ambulatory Visit: Payer: Self-pay | Admitting: *Deleted

## 2014-01-17 DIAGNOSIS — B20 Human immunodeficiency virus [HIV] disease: Secondary | ICD-10-CM

## 2014-01-17 MED ORDER — EFAVIRENZ-EMTRICITAB-TENOFOVIR 600-200-300 MG PO TABS: 1.0000 | ORAL_TABLET | Freq: Every day | ORAL | Status: DC

## 2014-01-17 NOTE — Telephone Encounter (Signed)
ADAP Application 

## 2014-01-19 LAB — HEPATITIS C RNA QUANTITATIVE
HCV QUANT LOG: 6.77 {Log} — AB (ref <1.18)
HCV QUANT: 5856919 [IU]/mL — AB (ref <15)

## 2014-01-24 LAB — HEPATITIS C GENOTYPE

## 2014-01-28 LAB — HIV-1 GENOTYPR PLUS

## 2014-02-01 ENCOUNTER — Ambulatory Visit: Payer: Self-pay | Admitting: Pharmacist Clinician (PhC)/ Clinical Pharmacy Specialist

## 2014-02-01 DIAGNOSIS — B182 Chronic viral hepatitis C: Secondary | ICD-10-CM

## 2014-02-08 ENCOUNTER — Ambulatory Visit: Payer: Self-pay

## 2014-02-10 ENCOUNTER — Ambulatory Visit (INDEPENDENT_AMBULATORY_CARE_PROVIDER_SITE_OTHER): Payer: Self-pay | Admitting: Internal Medicine

## 2014-02-10 ENCOUNTER — Encounter: Payer: Self-pay | Admitting: Internal Medicine

## 2014-02-10 DIAGNOSIS — B20 Human immunodeficiency virus [HIV] disease: Secondary | ICD-10-CM

## 2014-02-10 MED ORDER — EMTRICITABINE-TENOFOVIR DF 200-300 MG PO TABS: 1.0000 | ORAL_TABLET | Freq: Every day | ORAL | Status: DC

## 2014-02-10 MED ORDER — DOLUTEGRAVIR SODIUM 50 MG PO TABS: 50.0000 mg | ORAL_TABLET | Freq: Every day | ORAL | Status: DC

## 2014-02-10 NOTE — Progress Notes (Signed)
Patient ID: Daisy Salinas, female   DOB: 1960-01-24, 54 y.o.   MRN: [id number] HPI: Daisy Salinas is a 54 y.o. female who is here for her f/u of hep C and HIV  Lab Results  Component Value Date   HCVGENOTYPE 1b 01/12/2014    Allergies: No Known Allergies  Vitals: Temp: 98.3 F (36.8 C) (01/21 0914) Temp Source: Oral (01/21 0914) BP: 134/85 mmHg (01/21 0914) Pulse Rate: 67 (01/21 0914)  Past Medical History: Past Medical History  Diagnosis Date  . HIV (human immunodeficiency virus infection)   . Hypertension   . Asthma   . TB (pulmonary tuberculosis) 1989    was exposed and treated  . UTI (urinary tract infection) 1997  . Depression   . Bipolar 1 disorder 2009  . GERD (gastroesophageal reflux disease)     Social History: History   Social History  . Marital Status: Married    Spouse Name: N/A    Number of Children: N/A  . Years of Education: N/A   Social History Main Topics  . Smoking status: Current Every Day Smoker -- 0.30 packs/day for 40 years    Types: Cigarettes  . Smokeless tobacco: Never Used     Comment: wanting "some pills" to help her stop  . Alcohol Use: No     Comment: not ETOH since June 5th  . Drug Use: No     Comment: nothing since June 5th  . Sexual Activity:    Partners: Male     Comment: given condoms   Other Topics Concern  . None   Social History Narrative    Labs: HIV 1 RNA QUANT (copies/mL)  Date Value  12/27/2013 3974*  09/20/2013 3857*  01/04/2013 42*   CD4 T CELL ABS (/uL)  Date Value  12/27/2013 1210  09/20/2013 1640  01/04/2013 1407   HEP B S AB (no units)  Date Value  03/17/2006 NO   HEPATITIS B SURFACE AG (no units)  Date Value  03/17/2006 NO   HCV AB (no units)  Date Value  03/17/2006 YES    Lab Results  Component Value Date   HCVGENOTYPE 1b 01/12/2014    Hepatitis C RNA quantitative Latest Ref Rng 01/12/2014 06/18/2007  HCV Quantitative <15 IU/mL 5856919(H) 2700000(H)  HCV Quantitative Log  <1.18 log 10 6.77(H) -    AST (U/L)  Date Value  12/27/2013 47*  09/20/2013 48*  01/04/2013 49*   ALT (U/L)  Date Value  12/27/2013 13  09/20/2013 16  01/04/2013 10    CrCl: CrCl cannot be calculated (Patient has no serum creatinine result on file.).  Fibrosis Score:  Child-Pugh Score:   Previous Treatment Regimen: Naive  Assessment: 54 yo who is here for her f/u of HIV and hep C. She is now qualified for Harvoni through support path. The drug will get here either tomorrow or Monday. I went over starting the hep C med at the same time as her husband. She is still waiting on ADAP for the HIV meds. We are going to put her on Tivicay and TRV.  Recommendations:  Harvoni 1 PO qday next week Tivicay 50mg  PO qday when ADAP approved Truvada 1 PO qday when ADAP approved  Ulyses [place], Minh CroomQuang, VermontPharm.D., BCPS, AAHIVP Clinical Infectious Disease Pharmacist [facility] 02/10/2014, 11:24 AM

## 2014-02-10 NOTE — Progress Notes (Signed)
Patient ID: Daisy Salinas, female   DOB: 04-08-1960, 54 y.o.   MRN: [id number]          Patient Active Problem List   Diagnosis Date Noted  . Chronic hepatitis C without hepatic coma 09/08/2007    Priority: High  . Human immunodeficiency virus (HIV) disease 10/29/2005    Priority: High  . SUBSTANCE ABUSE, MULTIPLE 11/30/2009  . BRONCHITIS, CHRONIC 01/03/2009  . CONSTIPATION, CHRONIC 05/09/2008  . DE QUERVAIN'S TENOSYNOVITIS, LEFT WRIST 05/09/2008  . HYSTERECTOMY, HX OF 05/13/2006  . DEPRESSION 05/06/2006  . SYPHILIS, LATE, LATENT 10/29/2005  . DYSLIPIDEMIA 10/29/2005  . CIGARETTE SMOKER 10/29/2005  . HYPERTENSION 10/29/2005  . PROLAPSE, VAGINAL WALL NOS 10/29/2005  . MENORRHAGIA 10/29/2005  . INSOMNIA 10/29/2005    Patient's Medications  New Prescriptions   DOLUTEGRAVIR (TIVICAY) 50 MG TABLET    Take 1 tablet (50 mg total) by mouth daily.   EMTRICITABINE-TENOFOVIR (TRUVADA) 200-300 MG PER TABLET    Take 1 tablet by mouth daily.  Previous Medications   DIVALPROEX (DEPAKOTE) 500 MG DR TABLET    Take 500 mg by mouth 2 (two) times daily. Take one tablet by mouth in the morning and 2 tablets by mouth each evening   HYDROCHLOROTHIAZIDE (HYDRODIURIL) 25 MG TABLET    Take 1 tablet (25 mg total) by mouth daily.   LISINOPRIL (PRINIVIL,ZESTRIL) 20 MG TABLET    Take 20 mg by mouth daily.   METHOCARBAMOL (ROBAXIN) 500 MG TABLET    Take 2 tablets (1,000 mg total) by mouth 4 (four) times daily.   NAPROXEN (NAPROSYN) 375 MG TABLET    Take 1 tablet (375 mg total) by mouth 2 (two) times daily.  Modified Medications   No medications on file  Discontinued Medications   EMTRICITABINE-TENOFOVIR (TRUVADA) 200-300 MG PER TABLET    Take 1 tablet by mouth daily.    Subjective: Daisy Salinas is in for her routine follow-up visit. She remains sober. She is not sure if her ADAP has been approved yet. She continues to smoke cigarette and has no current plan to quit. Review of Systems: Pertinent items  are noted in HPI.  Past Medical History  Diagnosis Date  . HIV (human immunodeficiency virus infection)   . Hypertension   . Asthma   . TB (pulmonary tuberculosis) 1989    was exposed and treated  . UTI (urinary tract infection) 1997  . Depression   . Bipolar 1 disorder 2009  . GERD (gastroesophageal reflux disease)     History  Substance Use Topics  . Smoking status: Current Every Day Smoker -- 0.30 packs/day for 40 years    Types: Cigarettes  . Smokeless tobacco: Never Used     Comment: wanting "some pills" to help her stop  . Alcohol Use: No     Comment: not ETOH since June 5th    Family History  Problem Relation Age of Onset  . Coronary artery disease Mother   . Leukemia Father     No Known Allergies  Objective: Temp: 98.3 F (36.8 C) (01/21 0914) Temp Source: Oral (01/21 0914) BP: 134/85 mmHg (01/21 0914) Pulse Rate: 67 (01/21 0914) Body mass index is 28.87 kg/(m^2).  General: She is in good spirits Oral: No oropharyngeal lesions Skin: No rash Lungs: Clear Cor: Regular S1 and S2 with no murmurs Abdomen: Soft and nontender  Lab Results Lab Results  Component Value Date   WBC 6.4 12/27/2013   HGB 13.6 12/27/2013   HCT 37.9 12/27/2013   MCV  92.4 12/27/2013   PLT 118* 12/27/2013    Lab Results  Component Value Date   CREATININE 0.72 12/27/2013   BUN 8 12/27/2013   NA 136 12/27/2013   K 3.8 12/27/2013   CL 105 12/27/2013   CO2 23 12/27/2013    Lab Results  Component Value Date   ALT 13 12/27/2013   AST 47* 12/27/2013   ALKPHOS 89 12/27/2013   BILITOT 0.6 12/27/2013    Lab Results  Component Value Date   CHOL 153 09/20/2013   HDL 31* 09/20/2013   LDLCALC 85 09/20/2013   TRIG 186* 09/20/2013   CHOLHDL 4.9 09/20/2013    Lab Results HIV 1 RNA QUANT (copies/mL)  Date Value  12/27/2013 3974*  09/20/2013 3857*  01/04/2013 42*   CD4 T CELL ABS (/uL)  Date Value  12/27/2013 1210  09/20/2013 1640  01/04/2013 1407   HIV genotype: Her  virus now has the [id number] mutation.   Assessment: Once her ADAP has been approved I will start her on a salvage regimen of Truvada and Tivicay.  She has been approved for Harvoni treatment of hepatitis C but her supply of Harvoni has not arrived yet. She will meet with our pharmacist, [facility], once it arrives to review adherence counseling.  She has no current desire to quit smoking cigarettes but is working very hard on sobriety.  Plan: 1. Start Truvada and Tivicay 2. Start Harvoni soon 3. Repeat labs 1 month after starting Harvoni   Cliffton Asters, MD [facility] [phone] pager   [phone] cell 02/10/2014, 9:48 AM

## 2014-02-14 ENCOUNTER — Other Ambulatory Visit: Payer: Self-pay | Admitting: *Deleted

## 2014-02-14 DIAGNOSIS — B20 Human immunodeficiency virus [HIV] disease: Secondary | ICD-10-CM

## 2014-02-14 MED ORDER — EMTRICITABINE-TENOFOVIR DF 200-300 MG PO TABS: 1.0000 | ORAL_TABLET | Freq: Every day | ORAL | Status: DC

## 2014-02-14 MED ORDER — DOLUTEGRAVIR SODIUM 50 MG PO TABS: 50.0000 mg | ORAL_TABLET | Freq: Every day | ORAL | Status: DC

## 2014-02-15 ENCOUNTER — Ambulatory Visit: Payer: Self-pay | Admitting: Pharmacist Clinician (PhC)/ Clinical Pharmacy Specialist

## 2014-02-15 DIAGNOSIS — B182 Chronic viral hepatitis C: Secondary | ICD-10-CM

## 2014-02-15 NOTE — Progress Notes (Signed)
Patient ID: Daisy Salinas, female   DOB: 10-13-1960, 54 y.o.   MRN: [id number] HPI: Daisy PartClarina Theall is a 54 y.o. female who is here to pick up her meds and education about her Harvoni  Lab Results  Component Value Date   HCVGENOTYPE 1b 01/12/2014    Allergies: No Known Allergies  Vitals:    Past Medical History: Past Medical History  Diagnosis Date  . HIV (human immunodeficiency virus infection)   . Hypertension   . Asthma   . TB (pulmonary tuberculosis) 1989    was exposed and treated  . UTI (urinary tract infection) 1997  . Depression   . Bipolar 1 disorder 2009  . GERD (gastroesophageal reflux disease)     Social History: History   Social History  . Marital Status: Married    Spouse Name: N/A    Number of Children: N/A  . Years of Education: N/A   Social History Main Topics  . Smoking status: Current Every Day Smoker -- 0.30 packs/day for 40 years    Types: Cigarettes  . Smokeless tobacco: Never Used     Comment: wanting "some pills" to help her stop  . Alcohol Use: No     Comment: not ETOH since June 5th  . Drug Use: No     Comment: nothing since June 5th  . Sexual Activity:    Partners: Male     Comment: given condoms   Other Topics Concern  . Not on file   Social History Narrative    Labs: HIV 1 RNA QUANT (copies/mL)  Date Value  12/27/2013 3974*  09/20/2013 3857*  01/04/2013 42*   CD4 T CELL ABS (/uL)  Date Value  12/27/2013 1210  09/20/2013 1640  01/04/2013 1407   HEP B S AB (no units)  Date Value  03/17/2006 NO   HEPATITIS B SURFACE AG (no units)  Date Value  03/17/2006 NO   HCV AB (no units)  Date Value  03/17/2006 YES    Lab Results  Component Value Date   HCVGENOTYPE 1b 01/12/2014    Hepatitis C RNA quantitative Latest Ref Rng 01/12/2014 06/18/2007  HCV Quantitative <15 IU/mL 5856919(H) 2700000(H)  HCV Quantitative Log <1.18 log 10 6.77(H) -    AST (U/L)  Date Value  12/27/2013 47*  09/20/2013 48*  01/04/2013  49*   ALT (U/L)  Date Value  12/27/2013 13  09/20/2013 16  01/04/2013 10    CrCl: CrCl cannot be calculated (Unknown ideal weight.).  Fibrosis Score:   Child-Pugh Score:  Previous Treatment Regimen: None  Assessment: Daisy Salinas is here with his wife to pick up the meds for her hep C. This is the first month supply. She is still not on ART yet because of ADAP. I told her exactly how to take it and avoid all antacids, H2 blockers and PPIs. Dr Orvan Falconerampbell has plan to put her on DTG + Truvada once she is approved for ADAP. She is going to call me in 3 wks to remind me about getting the 2nd month supply of harvoni from [facility]. Going to get labs at that time.   Recommendations:  Start Harvoni 1 PO qday Labs and refill in 3-4 wks  Ulyses Southwardham, Minh Stony RidgeQuang, VermontPharm.D., BCPS, AAHIVP Clinical Infectious Disease Pharmacist [facility] 02/15/2014, 10:35 AM

## 2014-03-08 ENCOUNTER — Other Ambulatory Visit: Payer: Self-pay | Admitting: *Deleted

## 2014-03-08 ENCOUNTER — Telehealth: Payer: Self-pay | Admitting: Pharmacist Clinician (PhC)/ Clinical Pharmacy Specialist

## 2014-03-08 ENCOUNTER — Other Ambulatory Visit: Payer: Self-pay | Admitting: Internal Medicine

## 2014-03-08 DIAGNOSIS — I10 Essential (primary) hypertension: Secondary | ICD-10-CM

## 2014-03-08 DIAGNOSIS — B20 Human immunodeficiency virus [HIV] disease: Secondary | ICD-10-CM

## 2014-03-08 MED ORDER — DOLUTEGRAVIR SODIUM 50 MG PO TABS: 50.0000 mg | ORAL_TABLET | Freq: Every day | ORAL | Status: DC

## 2014-03-08 MED ORDER — HYDROCHLOROTHIAZIDE 25 MG PO TABS: 25.0000 mg | ORAL_TABLET | Freq: Every day | ORAL | Status: DC

## 2014-03-08 MED ORDER — EMTRICITABINE-TENOFOVIR DF 200-300 MG PO TABS: 1.0000 | ORAL_TABLET | Freq: Every day | ORAL | Status: DC

## 2014-03-08 NOTE — Telephone Encounter (Signed)
Daisy Salinas called to inquired about her Harvoni. It's being shipped to the clinic 2/19. They are coming to see me and pick up their meds on Monday.

## 2014-03-14 ENCOUNTER — Other Ambulatory Visit (INDEPENDENT_AMBULATORY_CARE_PROVIDER_SITE_OTHER): Payer: Self-pay

## 2014-03-14 ENCOUNTER — Ambulatory Visit: Payer: Self-pay | Admitting: Pharmacist Clinician (PhC)/ Clinical Pharmacy Specialist

## 2014-03-14 DIAGNOSIS — B1710 Acute hepatitis C without hepatic coma: Secondary | ICD-10-CM

## 2014-03-14 NOTE — Progress Notes (Signed)
Patient ID: Daisy Salinas, female   DOB: October 28, 1960, 54 y.o.   MRN: [id number] HPI: Daisy PartClarina Pabst is a 54 y.o. female who is here for her 2nd month f/u of her hep C.   Allergies: No Known Allergies  Vitals:    Past Medical History: Past Medical History  Diagnosis Date  . HIV (human immunodeficiency virus infection)   . Hypertension   . Asthma   . TB (pulmonary tuberculosis) 1989    was exposed and treated  . UTI (urinary tract infection) 1997  . Depression   . Bipolar 1 disorder 2009  . GERD (gastroesophageal reflux disease)     Social History: History   Social History  . Marital Status: Married    Spouse Name: N/A  . Number of Children: N/A  . Years of Education: N/A   Social History Main Topics  . Smoking status: Current Every Day Smoker -- 0.30 packs/day for 40 years    Types: Cigarettes  . Smokeless tobacco: Never Used     Comment: wanting "some pills" to help her stop  . Alcohol Use: No     Comment: not ETOH since June 5th  . Drug Use: No     Comment: nothing since June 5th  . Sexual Activity:    Partners: Male     Comment: given condoms   Other Topics Concern  . Not on file   Social History Narrative    Previous Regimen:   Current Regimen: DTG + TRV  Labs: HIV 1 RNA QUANT (copies/mL)  Date Value  12/27/2013 3974*  09/20/2013 3857*  01/04/2013 42*   CD4 T CELL ABS (/uL)  Date Value  12/27/2013 1210  09/20/2013 1640  01/04/2013 1407   HEP B S AB (no units)  Date Value  03/17/2006 NO   HEPATITIS B SURFACE AG (no units)  Date Value  03/17/2006 NO   HCV AB (no units)  Date Value  03/17/2006 YES    CrCl: CrCl cannot be calculated (Unknown ideal weight.).  Lipids:    Component Value Date/Time   CHOL 153 09/20/2013 0923   TRIG 186* 09/20/2013 0923   HDL 31* 09/20/2013 0923   CHOLHDL 4.9 09/20/2013 0923   VLDL 37 09/20/2013 0923   LDLCALC 85 09/20/2013 0923    Assessment: 54 yo who was recently restarted on her ART  with DTG+TRV. He has resistance in her system already du to a hx non-compliance. She is also on her 2nd mo of Harvoni for her hep C. She has no side effects and not missed a dose. She tolerating very well. She started on ART only about a week ago so it's too early to do her HIV VL. We are going to get her hep C VL today.   Recommendations: Cont Harvoni 1 PO qday Cont DTG + TRV See back in one month for the 3rd mo of her Harvoni  Arlester MarkerPham, Kerri PerchesMinh Quang, PharmD Clinical Infectious Disease Pharmacist [facility] 03/14/2014, 2:49 PM

## 2014-03-15 ENCOUNTER — Ambulatory Visit: Payer: Self-pay

## 2014-03-15 LAB — CBC WITH DIFFERENTIAL/PLATELET
BASOS PCT: 0 % (ref 0–1)
Basophils Absolute: 0 10*3/uL (ref 0.0–0.1)
Eosinophils Absolute: 0.1 10*3/uL (ref 0.0–0.7)
Eosinophils Relative: 1 % (ref 0–5)
HCT: 37.6 % (ref 36.0–46.0)
Hemoglobin: 12.9 g/dL (ref 12.0–15.0)
LYMPHS PCT: 61 % — AB (ref 12–46)
Lymphs Abs: 3.6 10*3/uL (ref 0.7–4.0)
MCH: 32.6 pg (ref 26.0–34.0)
MCHC: 34.3 g/dL (ref 30.0–36.0)
MCV: 94.9 fL (ref 78.0–100.0)
MONOS PCT: 8 % (ref 3–12)
MPV: 12.5 fL — AB (ref 8.6–12.4)
Monocytes Absolute: 0.5 10*3/uL (ref 0.1–1.0)
NEUTROS ABS: 1.8 10*3/uL (ref 1.7–7.7)
Neutrophils Relative %: 30 % — ABNORMAL LOW (ref 43–77)
PLATELETS: 117 10*3/uL — AB (ref 150–400)
RBC: 3.96 MIL/uL (ref 3.87–5.11)
RDW: 13.2 % (ref 11.5–15.5)
WBC: 5.9 10*3/uL (ref 4.0–10.5)

## 2014-03-15 LAB — HEPATITIS C RNA QUANTITATIVE: HCV QUANT: NOT DETECTED [IU]/mL (ref <15)

## 2014-03-22 ENCOUNTER — Telehealth: Payer: Self-pay | Admitting: Pharmacist Clinician (PhC)/ Clinical Pharmacy Specialist

## 2014-03-22 NOTE — Telephone Encounter (Signed)
Daisy CatchingsCraig Salinas called and stated that his wife has "lost" her second month supply and he has been sharing his supply with her. We are going to try to see if the pharmacy can refill his/her supply early. Their VL for the first month is undetectable.

## 2014-03-28 ENCOUNTER — Other Ambulatory Visit: Payer: Self-pay | Admitting: Internal Medicine

## 2014-03-28 DIAGNOSIS — I10 Essential (primary) hypertension: Secondary | ICD-10-CM

## 2014-04-07 ENCOUNTER — Ambulatory Visit: Payer: Self-pay

## 2014-04-07 ENCOUNTER — Other Ambulatory Visit (INDEPENDENT_AMBULATORY_CARE_PROVIDER_SITE_OTHER): Payer: Self-pay

## 2014-04-07 DIAGNOSIS — B182 Chronic viral hepatitis C: Secondary | ICD-10-CM

## 2014-04-07 DIAGNOSIS — B20 Human immunodeficiency virus [HIV] disease: Secondary | ICD-10-CM

## 2014-04-12 LAB — HEPATITIS C RNA QUANTITATIVE: HCV QUANT: NOT DETECTED [IU]/mL (ref <15)

## 2014-05-16 ENCOUNTER — Other Ambulatory Visit (INDEPENDENT_AMBULATORY_CARE_PROVIDER_SITE_OTHER): Payer: Self-pay

## 2014-05-16 DIAGNOSIS — B1920 Unspecified viral hepatitis C without hepatic coma: Secondary | ICD-10-CM

## 2014-05-16 DIAGNOSIS — B20 Human immunodeficiency virus [HIV] disease: Secondary | ICD-10-CM

## 2014-05-16 LAB — COMPLETE METABOLIC PANEL WITH GFR
ALBUMIN: 3.8 g/dL (ref 3.5–5.2)
AST: 25 U/L (ref 0–37)
Alkaline Phosphatase: 62 U/L (ref 39–117)
BILIRUBIN TOTAL: 0.4 mg/dL (ref 0.2–1.2)
BUN: 11 mg/dL (ref 6–23)
CO2: 25 meq/L (ref 19–32)
CREATININE: 0.73 mg/dL (ref 0.50–1.10)
Calcium: 9.3 mg/dL (ref 8.4–10.5)
Chloride: 103 mEq/L (ref 96–112)
GFR, Est African American: >89 mL/min
Glucose, Bld: 86 mg/dL (ref 70–99)
Potassium: 3.5 mEq/L (ref 3.5–5.3)
SODIUM: 138 meq/L (ref 135–145)
Total Protein: 7.7 g/dL (ref 6.0–8.3)

## 2014-05-16 LAB — CBC WITH DIFFERENTIAL/PLATELET
Basophils Absolute: 0 10*3/uL (ref 0.0–0.1)
Basophils Relative: 0 % (ref 0–1)
Eosinophils Absolute: 0.1 10*3/uL (ref 0.0–0.7)
Eosinophils Relative: 1 % (ref 0–5)
HEMATOCRIT: 36.3 % (ref 36.0–46.0)
Hemoglobin: 12.7 g/dL (ref 12.0–15.0)
Lymphocytes Relative: 59 % — ABNORMAL HIGH (ref 12–46)
Lymphs Abs: 3.2 10*3/uL (ref 0.7–4.0)
MCH: 33.4 pg (ref 26.0–34.0)
MCHC: 35 g/dL (ref 30.0–36.0)
MCV: 95.5 fL (ref 78.0–100.0)
MPV: 11.4 fL (ref 8.6–12.4)
Monocytes Absolute: 0.4 10*3/uL (ref 0.1–1.0)
Monocytes Relative: 8 % (ref 3–12)
Neutro Abs: 1.8 10*3/uL (ref 1.7–7.7)
Neutrophils Relative %: 32 % — ABNORMAL LOW (ref 43–77)
PLATELETS: 120 10*3/uL — AB (ref 150–400)
RBC: 3.8 MIL/uL — ABNORMAL LOW (ref 3.87–5.11)
RDW: 16 % — ABNORMAL HIGH (ref 11.5–15.5)
WBC: 5.5 10*3/uL (ref 4.0–10.5)

## 2014-05-17 LAB — T-HELPER CELL (CD4) - (RCID CLINIC ONLY)
CD4 T CELL HELPER: 42 % (ref 33–55)
CD4 T Cell Abs: 1380 /uL (ref 400–2700)

## 2014-05-17 LAB — HEPATITIS C RNA QUANTITATIVE: HCV Quantitative: NOT DETECTED IU/mL (ref <15)

## 2014-05-18 LAB — HIV-1 RNA QUANT-NO REFLEX-BLD: HIV-1 RNA Quant, Log: <1.3 {Log} (ref <1.30)

## 2014-05-31 ENCOUNTER — Ambulatory Visit (INDEPENDENT_AMBULATORY_CARE_PROVIDER_SITE_OTHER): Payer: Self-pay | Admitting: Internal Medicine

## 2014-05-31 DIAGNOSIS — B182 Chronic viral hepatitis C: Secondary | ICD-10-CM

## 2014-05-31 DIAGNOSIS — B20 Human immunodeficiency virus [HIV] disease: Secondary | ICD-10-CM

## 2014-05-31 MED ORDER — ELVITEG-COBIC-EMTRICIT-TENOFAF 150-150-200-10 MG PO TABS: 1.0000 | ORAL_TABLET | Freq: Every day | ORAL | Status: DC

## 2014-05-31 NOTE — Progress Notes (Signed)
Patient ID: Daisy Salinas, female   DOB: 01-Aug-1960, 54 y.o.   MRN: [id number]          Patient Active Problem List   Diagnosis Date Noted  . Chronic hepatitis C without hepatic coma 09/08/2007    Priority: High  . Human immunodeficiency virus (HIV) disease 10/29/2005    Priority: High  . SUBSTANCE ABUSE, MULTIPLE 11/30/2009  . BRONCHITIS, CHRONIC 01/03/2009  . CONSTIPATION, CHRONIC 05/09/2008  . DE QUERVAIN'S TENOSYNOVITIS, LEFT WRIST 05/09/2008  . HYSTERECTOMY, HX OF 05/13/2006  . DEPRESSION 05/06/2006  . SYPHILIS, LATE, LATENT 10/29/2005  . DYSLIPIDEMIA 10/29/2005  . CIGARETTE SMOKER 10/29/2005  . HYPERTENSION 10/29/2005  . PROLAPSE, VAGINAL WALL NOS 10/29/2005  . MENORRHAGIA 10/29/2005  . INSOMNIA 10/29/2005    Patient's Medications  New Prescriptions   ELVITEGRAVIR-COBICISTAT-EMTRICITABINE-TENOFOVIR (GENVOYA) 150-150-200-10 MG TABS TABLET    Take 1 tablet by mouth daily with breakfast.  Previous Medications   DIVALPROEX (DEPAKOTE) 500 MG DR TABLET    Take 500 mg by mouth 2 (two) times daily. Take one tablet by mouth in the morning and 2 tablets by mouth each evening   HYDROCHLOROTHIAZIDE (HYDRODIURIL) 25 MG TABLET    Take 1 tablet (25 mg total) by mouth daily.   LISINOPRIL (PRINIVIL,ZESTRIL) 20 MG TABLET    TAKE 1 TABLET BY MOUTH DAILY   METHOCARBAMOL (ROBAXIN) 500 MG TABLET    Take 2 tablets (1,000 mg total) by mouth 4 (four) times daily.   NAPROXEN (NAPROSYN) 375 MG TABLET    Take 1 tablet (375 mg total) by mouth 2 (two) times daily.  Modified Medications   No medications on file  Discontinued Medications   DOLUTEGRAVIR (TIVICAY) 50 MG TABLET    Take 1 tablet (50 mg total) by mouth daily.   EMTRICITABINE-TENOFOVIR (TRUVADA) 200-300 MG PER TABLET    Take 1 tablet by mouth daily.    Subjective: Daisy Salinas is in for her routine follow-up visit. She denies missing any doses of her Truvada or Tivicay for her HIV infection. She completed 3 months of Harvoni several weeks  ago for her chronic hepatitis C. Her husband, [name], was recently hospitalized with a stroke. She slept in a chair in his hospital room and has been having acute on chronic back pain ever since. She is trying to cut down on her cigarettes but has no current plan to quit. She has not used drugs in over 4 months.  Review of Systems: Constitutional: positive for malaise, negative for anorexia, chills, fevers, sweats and weight loss Eyes: negative Ears, nose, mouth, throat, and face: negative Respiratory: negative Cardiovascular: negative Gastrointestinal: negative Genitourinary:negative  Past Medical History  Diagnosis Date  . HIV (human immunodeficiency virus infection)   . Hypertension   . Asthma   . TB (pulmonary tuberculosis) 1989    was exposed and treated  . UTI (urinary tract infection) 1997  . Depression   . Bipolar 1 disorder 2009  . GERD (gastroesophageal reflux disease)     History  Substance Use Topics  . Smoking status: Current Every Day Smoker -- 0.30 packs/day for 40 years    Types: Cigarettes  . Smokeless tobacco: Never Used     Comment: wanting "some pills" to help her stop  . Alcohol Use: No     Comment: not ETOH since June 5th    Family History  Problem Relation Age of Onset  . Coronary artery disease Mother   . Leukemia Father     No Known Allergies  Objective: Temp:  98.2 F (36.8 C) (05/10 0941) Temp Source: Oral (05/10 0941) BP: 134/78 mmHg (05/10 0941) Pulse Rate: 78 (05/10 0941) Body mass index is 32.7 kg/(m^2).  General: She is a little uncomfortable due to back pain but otherwise is in good spirits Oral: No oropharyngeal lesions Skin: No rash Lungs: Clear Cor: Regular S1 and S2 with no murmur Abdomen: Obese, soft and nontender Joints and extremities: Normal Neuro: Alert with normal speech and conversation Mood: Normal  Lab Results Lab Results  Component Value Date   WBC 5.5 05/16/2014   HGB 12.7 05/16/2014   HCT 36.3 05/16/2014     MCV 95.5 05/16/2014   PLT 120* 05/16/2014    Lab Results  Component Value Date   CREATININE 0.73 05/16/2014   BUN 11 05/16/2014   NA 138 05/16/2014   K 3.5 05/16/2014   CL 103 05/16/2014   CO2 25 05/16/2014    Lab Results  Component Value Date   ALT <8 05/16/2014   AST 25 05/16/2014   ALKPHOS 62 05/16/2014   BILITOT 0.4 05/16/2014    Lab Results  Component Value Date   CHOL 153 09/20/2013   HDL 31* 09/20/2013   LDLCALC 85 09/20/2013   TRIG 186* 09/20/2013   CHOLHDL 4.9 09/20/2013    Lab Results HIV 1 RNA QUANT (copies/mL)  Date Value  05/16/2014 <20  12/27/2013 3974*  09/20/2013 3857*   CD4 T CELL ABS (/uL)  Date Value  05/16/2014 1380  12/27/2013 1210  09/20/2013 1640     Assessment: Her HIV infection is back under excellent control. [facility]  I suspect that her hepatitis C has been cured.  She is currently drug free and feeling better.  She has acute on chronic low back pain.  I talked to her about the importance of trying to go through with a plan to quit smoking cigarettes completely, especially since her husband is currently now off cigarettes.  Plan: 1. Change Truvada and Tivicay to Genvoya once daily with a meal 2. Follow-up after hepatitis C viral load in 3 months 3. Continue substance abuse counseling 4. Cigarette cessation counseling provided   Cliffton AstersJohn Eward Rutigliano, MD [facility] [phone] pager   [phone] cell 05/31/2014, 9:58 AM

## 2014-06-01 ENCOUNTER — Encounter (HOSPITAL_COMMUNITY): Payer: Self-pay | Admitting: Emergency Medicine

## 2014-06-01 ENCOUNTER — Emergency Department (INDEPENDENT_AMBULATORY_CARE_PROVIDER_SITE_OTHER)
Admission: EM | Admit: 2014-06-01 | Discharge: 2014-06-01 | Disposition: A | Discharge status: Home / Self Care | Payer: Self-pay | Source: Home / Self Care | Attending: Emergency Medicine | Admitting: Emergency Medicine

## 2014-06-01 DIAGNOSIS — T2124XA Burn of second degree of lower back, initial encounter: Secondary | ICD-10-CM

## 2014-06-01 DIAGNOSIS — M545 Low back pain, unspecified: Secondary | ICD-10-CM

## 2014-06-01 LAB — POCT URINALYSIS DIP (DEVICE)
Bilirubin Urine: NEGATIVE
Glucose, UA: NEGATIVE mg/dL
HGB URINE DIPSTICK: NEGATIVE
KETONES UR: NEGATIVE mg/dL
Leukocytes, UA: NEGATIVE
Nitrite: NEGATIVE
PROTEIN: NEGATIVE mg/dL
SPECIFIC GRAVITY, URINE: 1.02 (ref 1.005–1.030)
UROBILINOGEN UA: 0.2 mg/dL (ref 0.0–1.0)
pH: 7 (ref 5.0–8.0)

## 2014-06-01 MED ORDER — KETOROLAC TROMETHAMINE 60 MG/2ML IM SOLN
60.0000 mg | Freq: Once | INTRAMUSCULAR | Status: AC
  Administered 2014-06-01: 60 mg via INTRAMUSCULAR

## 2014-06-01 MED ORDER — NAPROXEN 375 MG PO TABS: 375.0000 mg | ORAL_TABLET | Freq: Two times a day (BID) | ORAL | Status: DC

## 2014-06-01 MED ORDER — CYCLOBENZAPRINE HCL 5 MG PO TABS: 5.0000 mg | ORAL_TABLET | Freq: Two times a day (BID) | ORAL | Status: DC | PRN

## 2014-06-01 MED ORDER — KETOROLAC TROMETHAMINE 60 MG/2ML IM SOLN
INTRAMUSCULAR | Status: AC
  Filled 2014-06-01: qty 2

## 2014-06-01 MED ORDER — SILVER SULFADIAZINE 1 % EX CREA: 1.0000 "application " | TOPICAL_CREAM | Freq: Every day | CUTANEOUS | Status: DC

## 2014-06-01 NOTE — Discharge Instructions (Signed)
Back Exercises Back exercises help treat and prevent back injuries. The goal of back exercises is to increase the strength of your abdominal and back muscles and the flexibility of your back. These exercises should be started when you no longer have back pain. Back exercises include:  Pelvic Tilt. Lie on your back with your knees bent. Tilt your pelvis until the lower part of your back is against the floor. Hold this position 5 to 10 sec and repeat 5 to 10 times.  Knee to Chest. Pull first 1 knee up against your chest and hold for 20 to 30 seconds, repeat this with the other knee, and then both knees. This may be done with the other leg straight or bent, whichever feels better.  Sit-Ups or Curl-Ups. Bend your knees 90 degrees. Start with tilting your pelvis, and do a partial, slow sit-up, lifting your trunk only 30 to 45 degrees off the floor. Take at least 2 to 3 seconds for each sit-up. Do not do sit-ups with your knees out straight. If partial sit-ups are difficult, simply do the above but with only tightening your abdominal muscles and holding it as directed.  Hip-Lift. Lie on your back with your knees flexed 90 degrees. Push down with your feet and shoulders as you raise your hips a couple inches off the floor; hold for 10 seconds, repeat 5 to 10 times.  Back arches. Lie on your stomach, propping yourself up on bent elbows. Slowly press on your hands, causing an arch in your low back. Repeat 3 to 5 times. Any initial stiffness and discomfort should lessen with repetition over time.  Shoulder-Lifts. Lie face down with arms beside your body. Keep hips and torso pressed to floor as you slowly lift your head and shoulders off the floor. Do not overdo your exercises, especially in the beginning. Exercises may cause you some mild back discomfort which lasts for a few minutes; however, if the pain is more severe, or lasts for more than 15 minutes, do not continue exercises until you see your caregiver.  Improvement with exercise therapy for back problems is slow.  See your caregivers for assistance with developing a proper back exercise program. Document Released: 02/15/2004 Document Revised: 04/01/2011 Document Reviewed: 11/08/2010 [facility] Patient Information 2015 [place], [place]. This information is not intended to replace advice given to you by your health care provider. Make sure you discuss any questions you have with your health care provider.  Back Injury Prevention Back injuries can be extremely painful and difficult to heal. After having one back injury, you are much more likely to experience another later on. It is important to learn how to avoid injuring or re-injuring your back. The following tips can help you to prevent a back injury. PHYSICAL FITNESS  Exercise regularly and try to develop good tone in your abdominal muscles. Your abdominal muscles provide a lot of the support needed by your back.  Do aerobic exercises (walking, jogging, biking, swimming) regularly.  Do exercises that increase balance and strength (tai chi, yoga) regularly. This can decrease your risk of falling and injuring your back.  Stretch before and after exercising.  Maintain a healthy weight. The more you weigh, the more stress is placed on your back. For every pound of weight, 10 times that amount of pressure is placed on the back. DIET  Talk to your caregiver about how much calcium and vitamin D you need per day. These nutrients help to prevent weakening of the bones (osteoporosis). Osteoporosis  can cause broken (fractured) bones that lead to back pain.  Include good sources of calcium in your diet, such as dairy products, green, leafy vegetables, and products with calcium added (fortified).  Include good sources of vitamin D in your diet, such as milk and foods that are fortified with vitamin D.  Consider taking a nutritional supplement or a multivitamin if needed.  Stop smoking if you  smoke. POSTURE  Sit and stand up straight. Avoid leaning forward when you sit or hunching over when you stand.  Choose chairs with good low back (lumbar) support.  If you work at a desk, sit close to your work so you do not need to lean over. Keep your chin tucked in. Keep your neck drawn back and elbows bent at a right angle. Your arms should look like the letter "L."  Sit high and close to the steering wheel when you drive. Add a lumbar support to your car seat if needed.  Avoid sitting or standing in one position for too long. Take breaks to get up, stretch, and walk around at least once every hour. Take breaks if you are driving for long periods of time.  Sleep on your side with your knees slightly bent, or sleep on your back with a pillow under your knees. Do not sleep on your stomach. LIFTING, TWISTING, AND REACHING  Avoid heavy lifting, especially repetitive lifting. If you must do heavy lifting:  Stretch before lifting.  Work slowly.  Rest between lifts.  Use carts and dollies to move objects when possible.  Make several small trips instead of carrying 1 heavy load.  Ask for help when you need it.  Ask for help when moving big, awkward objects.  Follow these steps when lifting:  Stand with your feet shoulder-width apart.  Get as close to the object as you can. Do not try to pick up heavy objects that are far from your body.  Use handles or lifting straps if they are available.  Bend at your knees. Squat down, but keep your heels off the floor.  Keep your shoulders pulled back, your chin tucked in, and your back straight.  Lift the object slowly, tightening the muscles in your legs, abdomen, and buttocks. Keep the object as close to the center of your body as possible.  When you put a load down, use these same guidelines in reverse.  Do not:  Lift the object above your waist.  Twist at the waist while lifting or carrying a load. Move your feet if you need to  turn, not your waist.  Bend over without bending at your knees.  Avoid reaching over your head, across a table, or for an object on a high surface. OTHER TIPS  Avoid wet floors and keep sidewalks clear of ice to prevent falls.  Do not sleep on a mattress that is too soft or too hard.  Keep items that are used frequently within easy reach.  Put heavier objects on shelves at waist level and lighter objects on lower or higher shelves.  Find ways to decrease your stress, such as exercise, massage, or relaxation techniques. Stress can build up in your muscles. Tense muscles are more vulnerable to injury.  Seek treatment for depression or anxiety if needed. These conditions can increase your risk of developing back pain. SEEK MEDICAL CARE IF:  You injure your back.  You have questions about diet, exercise, or other ways to prevent back injuries. MAKE SURE YOU:  Understand these  instructions.  Will watch your condition.  Will get help right away if you are not doing well or get worse. Document Released: 02/15/2004 Document Revised: 04/01/2011 Document Reviewed: 02/18/2011 [facility] Patient Information 2015 [place], [place]. This information is not intended to replace advice given to you by your health care provider. Make sure you discuss any questions you have with your health care provider.  Back Pain, Adult Low back pain is very common. About 1 in 5 people have back pain.The cause of low back pain is rarely dangerous. The pain often gets better over time.About half of people with a sudden onset of back pain feel better in just 2 weeks. About 8 in 10 people feel better by 6 weeks.  CAUSES Some common causes of back pain include:  Strain of the muscles or ligaments supporting the spine.  Wear and tear (degeneration) of the spinal discs.  Arthritis.  Direct injury to the back. DIAGNOSIS Most of the time, the direct cause of low back pain is not known.However, back pain can be  treated effectively even when the exact cause of the pain is unknown.Answering your caregiver's questions about your overall health and symptoms is one of the most accurate ways to make sure the cause of your pain is not dangerous. If your caregiver needs more information, he or she may order lab work or imaging tests (X-rays or MRIs).However, even if imaging tests show changes in your back, this usually does not require surgery. HOME CARE INSTRUCTIONS For many people, back pain returns.Since low back pain is rarely dangerous, it is often a condition that people can learn to [facility] their own.   Remain active. It is stressful on the back to sit or stand in one place. Do not sit, drive, or stand in one place for more than 30 minutes at a time. Take short walks on level surfaces as soon as pain allows.Try to increase the length of time you walk each day.  Do not stay in bed.Resting more than 1 or 2 days can delay your recovery.  Do not avoid exercise or work.Your body is made to move.It is not dangerous to be active, even though your back may hurt.Your back will likely heal faster if you return to being active before your pain is gone.  Pay attention to your body when you bend and lift. Many people have less discomfortwhen lifting if they bend their knees, keep the load close to their bodies,and avoid twisting. Often, the most comfortable positions are those that put less stress on your recovering back.  Find a comfortable position to sleep. Use a firm mattress and lie on your side with your knees slightly bent. If you lie on your back, put a pillow under your knees.  Only take over-the-counter or prescription medicines as directed by your caregiver. Over-the-counter medicines to reduce pain and inflammation are often the most helpful.Your caregiver may prescribe muscle relaxant drugs.These medicines help dull your pain so you can more quickly return to your normal activities and healthy  exercise.  Put ice on the injured area.  Put ice in a plastic bag.  Place a towel between your skin and the bag.  Leave the ice on for 15-20 minutes, 03-04 times a day for the first 2 to 3 days. After that, ice and heat may be alternated to reduce pain and spasms.  Ask your caregiver about trying back exercises and gentle massage. This may be of some benefit.  Avoid feeling anxious or stressed.Stress increases  muscle tension and can worsen back pain.It is important to recognize when you are anxious or stressed and learn ways to manage it.Exercise is a great option. SEEK MEDICAL CARE IF:  You have pain that is not relieved with rest or medicine.  You have pain that does not improve in 1 week.  You have new symptoms.  You are generally not feeling well. SEEK IMMEDIATE MEDICAL CARE IF:   You have pain that radiates from your back into your legs.  You develop new bowel or bladder control problems.  You have unusual weakness or numbness in your arms or legs.  You develop nausea or vomiting.  You develop abdominal pain.  You feel faint. Document Released: 01/07/2005 Document Revised: 07/09/2011 Document Reviewed: 05/11/2013 [facility] Patient Information 2015 [place], [place]. This information is not intended to replace advice given to you by your health care provider. Make sure you discuss any questions you have with your health care provider.  Burn Care Your skin is a natural barrier to infection. It is the largest organ of your body. Burns damage this natural protection. To help prevent infection, it is very important to follow your caregiver's instructions in the care of your burn. Burns are classified as:  First degree. There is only redness of the skin (erythema). No scarring is expected.  Second degree. There is blistering of the skin. Scarring may occur with deeper burns.  Third degree. All layers of the skin are injured, and scarring is expected. HOME CARE  INSTRUCTIONS   Wash your hands well before changing your bandage.  Change your bandage as often as directed by your caregiver.  Remove the old bandage. If the bandage sticks, you may soak it off with cool, clean water.  Cleanse the burn thoroughly but gently with mild soap and water.  Pat the area dry with a clean, dry cloth.  Apply a thin layer of antibacterial cream to the burn.  Apply a clean bandage as instructed by your caregiver.  Keep the bandage as clean and dry as possible.  Elevate the affected area for the first 24 hours, then as instructed by your caregiver.  Only take over-the-counter or prescription medicines for pain, discomfort, or fever as directed by your caregiver. SEEK IMMEDIATE MEDICAL CARE IF:   You develop excessive pain.  You develop redness, tenderness, swelling, or red streaks near the burn.  The burned area develops yellowish-white fluid (pus) or a bad smell.  You have a fever. MAKE SURE YOU:   Understand these instructions.  Will watch your condition.  Will get help right away if you are not doing well or get worse. Document Released: 01/07/2005 Document Revised: 04/01/2011 Document Reviewed: 05/30/2010 [facility] Patient Information 2015 [place], [place]. This information is not intended to replace advice given to you by your health care provider. Make sure you discuss any questions you have with your health care provider.  Lumbosacral Strain Lumbosacral strain is a strain of any of the parts that make up your lumbosacral vertebrae. Your lumbosacral vertebrae are the bones that make up the lower third of your backbone. Your lumbosacral vertebrae are held together by muscles and tough, fibrous tissue (ligaments).  CAUSES  A sudden blow to your back can cause lumbosacral strain. Also, anything that causes an excessive stretch of the muscles in the low back can cause this strain. This is typically seen when people exert themselves strenuously, fall,  lift heavy objects, bend, or crouch repeatedly. RISK FACTORS  Physically demanding work.  Participation  in pushing or pulling sports or sports that require a sudden twist of the back (tennis, golf, baseball).  Weight lifting.  Excessive lower back curvature.  Forward-tilted pelvis.  Weak back or abdominal muscles or both.  Tight hamstrings. SIGNS AND SYMPTOMS  Lumbosacral strain may cause pain in the area of your injury or pain that moves (radiates) down your leg.  DIAGNOSIS Your health care provider can often diagnose lumbosacral strain through a physical exam. In some cases, you may need tests such as X-ray exams.  TREATMENT  Treatment for your lower back injury depends on many factors that your clinician will have to evaluate. However, most treatment will include the use of anti-inflammatory medicines. HOME CARE INSTRUCTIONS   Avoid hard physical activities (tennis, racquetball, waterskiing) if you are not in proper physical condition for it. This may aggravate or create problems.  If you have a back problem, avoid sports requiring sudden body movements. Swimming and walking are generally safer activities.  Maintain good posture.  Maintain a healthy weight.  For acute conditions, you may put ice on the injured area.  Put ice in a plastic bag.  Place a towel between your skin and the bag.  Leave the ice on for 20 minutes, 2-3 times a day.  When the low back starts healing, stretching and strengthening exercises may be recommended. SEEK MEDICAL CARE IF:  Your back pain is getting worse.  You experience severe back pain not relieved with medicines. SEEK IMMEDIATE MEDICAL CARE IF:   You have numbness, tingling, weakness, or problems with the use of your arms or legs.  There is a change in bowel or bladder control.  You have increasing pain in any area of the body, including your belly (abdomen).  You notice shortness of breath, dizziness, or feel faint.  You  feel sick to your stomach (nauseous), are throwing up (vomiting), or become sweaty.  You notice discoloration of your toes or legs, or your feet get very cold. MAKE SURE YOU:   Understand these instructions.  Will watch your condition.  Will get help right away if you are not doing well or get worse. Document Released: 10/17/2004 Document Revised: 01/12/2013 Document Reviewed: 08/26/2012 [facility] Patient Information 2015 [place], [place]. This information is not intended to replace advice given to you by your health care provider. Make sure you discuss any questions you have with your health care provider.  Second-Degree Burn A second-degree burn affects the 2 outer layers of skin. The outer layer (epidermis) and the layer underneath it (dermis) are both burned. Another name for this type of burn is a partial thickness burn. A second-degree burn may be called minor or major. This depends on the size of the burn. It also depends on what parts of the skin are burned. Minor burns may be treated with first aid. Major burns are a medical emergency. A second-degree burn is worse than a first-degree burn, but not as bad as a third-degree burn. A first-degree burn affects only the epidermis. A third-degree burn goes through all the layers of skin. A second-degree burn usually heals in 3 to 4 weeks. A minor second-degree burn usually does not leave a scar.Deeper second-degree burns may lead to scarring of the skin or contractures over joints.Contractures are scars that form over joints and may lead to reduced mobility at those joints. CAUSES  Heat (thermal) injury. This happens when skin comes in contact with something very hot. It could be a flame, a hot object, hot liquid,  or steam. Most second-degree burns are thermal injuries.  Radiation. Sunlight is one type of radiation that can burn the skin. Another type of radiation is used to heat food. Radiation is also used to treat some diseases, such as  cancer. All types of radiation can burn the skin. Sunlight usually causes a first-degree burn. Radiation used for heating food or treating a disease can cause a second-degree burn.  Electricity. Electrical burns can cause more damage under the skin than on the surface. They should always be treated as major burns.  Chemicals. Many chemicals can burn the skin. The burn should be flushed with cool water and checked by an emergency caregiver. SYMPTOMS Symptoms of second-degree burns include:  Severe pain.  Extreme tenderness.  Deep redness.  Blistered skin.  Skin that has changed color.It might look blotchy, wet, or shiny.  Swelling. TREATMENT Some second-degree burns may need to be treated in a hospital. These include major burns, electrical burns, and chemical burns. Many other second-degree burns can be treated with regular first aid, such as:  Cooling the burn. Use cool, germ-free (sterile) salt water. Place the burned area of skin into a tub of water, or cover the burned area with clean, wet towels.  Taking pain medicine.  Removing the dead skin from broken blisters. A trained caregiver may do this. Do not pop blisters.  Gently washing your skin with mild soap.  Covering the burned area with a cream.Silver sulfadiazine is a cream for burns. An antibiotic cream, such as bacitracin, may also be used to fight infection. Do not use other ointments or creams unless your caregiver says it is okay.  Protecting the burn with a sterile, non-sticky bandage.  Bandaging fingers and toes separately. This keeps them from sticking together.  Taking an antibiotic. This can help prevent infection.  Getting a tetanus shot. HOME CARE INSTRUCTIONS Medication  Take any medicine prescribed by your caregiver. Follow the directions carefully.  Ask your caregiver if you can take over-the-counter medicine to relieve pain and swelling. Do not give aspirin to children.  Make sure your caregiver  knows about all other medicines you take.This includes over-the-counter medicines. Burn care  You will need to change the bandage on your burn. You may need to do this 2 or 3 times each day.  Gently clean the burned area.  Put ointment on it.  Cover the burn with a sterile bandage.  For some deeper burns or burns that cover a large area, compression garments may be prescribed. These garments can help minimize scarring and protect your mobility.  Do not put butter or oil on your skin. Use only the cream prescribed by your caregiver.  Do not put ice on your burn.  Do not break blisters on your skin.  Keep the bandaged area dry. You might need to take a sponge bath for awhile.Ask your caregiver when you can take a shower or a tub bath again.  Do not scratch an itchy burn. Your caregiver may give you medicine to relieve very bad itching.  Infection is a big danger after a second-degree burn. Tell your caregiver right away if you have signs of infection, such as:  Redness or changing color in the burned area.  Fluid leaking from the burn.  Swelling in the burn area.  A bad smell coming from the wound. Follow-up  Keep all follow-up appointments.This is important. This is how your caregiver can tell if your treatment is working.  Protect your burn from sunlight.Use  sunscreen whenever you go outside.Burned areas may be sensitive to the sun for up to 1 year. Exposure to the sun may also cause permanent darkening of scars. SEEK MEDICAL CARE IF:  You have any questions about medicines.  You have any questions about your treatment.  You wonder if it is okay to do a particular activity.  You develop a fever of more than 100.5 F (38.1 C). SEEK IMMEDIATE MEDICAL CARE IF:  You think your burn might be infected. It may change color, become red, leak fluid, swell, or smell bad.  You develop a fever of more than 102 F (38.9 C). Document Released: 06/11/2010 Document Revised:  04/01/2011 Document Reviewed: 06/11/2010 [facility] Patient Information 2015 [place], [place]. This information is not intended to replace advice given to you by your health care provider. Make sure you discuss any questions you have with your health care provider.
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
## 2014-06-01 NOTE — ED Notes (Signed)
C/o lower back pain onset Tuesday; reports she slept in a hospital chair on Tuesday Denies inj/trauma, urinary sx Pain increases w/activity  Alert, no signs of acute distress.

## 2014-06-05 ENCOUNTER — Emergency Department (HOSPITAL_COMMUNITY)
Admission: EM | Admit: 2014-06-05 | Discharge: 2014-06-05 | Disposition: A | Discharge status: Home / Self Care | Payer: Self-pay | Attending: Emergency Medicine | Admitting: Emergency Medicine

## 2014-06-05 ENCOUNTER — Emergency Department (HOSPITAL_COMMUNITY): Payer: Self-pay

## 2014-06-05 ENCOUNTER — Encounter (HOSPITAL_COMMUNITY): Payer: Self-pay | Admitting: *Deleted

## 2014-06-05 ENCOUNTER — Emergency Department (INDEPENDENT_AMBULATORY_CARE_PROVIDER_SITE_OTHER)
Admission: EM | Admit: 2014-06-05 | Discharge: 2014-06-05 | Disposition: A | Discharge status: Nursing Home (Includes ICF) | Payer: Self-pay | Source: Home / Self Care | Attending: Family Medicine | Admitting: Family Medicine

## 2014-06-05 DIAGNOSIS — Z8611 Personal history of tuberculosis: Secondary | ICD-10-CM | POA: Insufficient documentation

## 2014-06-05 DIAGNOSIS — Z21 Asymptomatic human immunodeficiency virus [HIV] infection status: Secondary | ICD-10-CM | POA: Insufficient documentation

## 2014-06-05 DIAGNOSIS — Z8719 Personal history of other diseases of the digestive system: Secondary | ICD-10-CM | POA: Insufficient documentation

## 2014-06-05 DIAGNOSIS — M25551 Pain in right hip: Secondary | ICD-10-CM | POA: Insufficient documentation

## 2014-06-05 DIAGNOSIS — R109 Unspecified abdominal pain: Secondary | ICD-10-CM

## 2014-06-05 DIAGNOSIS — Z87828 Personal history of other (healed) physical injury and trauma: Secondary | ICD-10-CM | POA: Insufficient documentation

## 2014-06-05 DIAGNOSIS — Z720 Tobacco use: Secondary | ICD-10-CM | POA: Insufficient documentation

## 2014-06-05 DIAGNOSIS — I10 Essential (primary) hypertension: Secondary | ICD-10-CM | POA: Insufficient documentation

## 2014-06-05 DIAGNOSIS — T300 Burn of unspecified body region, unspecified degree: Secondary | ICD-10-CM

## 2014-06-05 DIAGNOSIS — F3131 Bipolar disorder, current episode depressed, mild: Secondary | ICD-10-CM | POA: Insufficient documentation

## 2014-06-05 DIAGNOSIS — R1010 Upper abdominal pain, unspecified: Secondary | ICD-10-CM

## 2014-06-05 DIAGNOSIS — J45909 Unspecified asthma, uncomplicated: Secondary | ICD-10-CM | POA: Insufficient documentation

## 2014-06-05 DIAGNOSIS — Z792 Long term (current) use of antibiotics: Secondary | ICD-10-CM | POA: Insufficient documentation

## 2014-06-05 DIAGNOSIS — M545 Low back pain, unspecified: Secondary | ICD-10-CM

## 2014-06-05 DIAGNOSIS — Z87440 Personal history of urinary (tract) infections: Secondary | ICD-10-CM | POA: Insufficient documentation

## 2014-06-05 DIAGNOSIS — Z79899 Other long term (current) drug therapy: Secondary | ICD-10-CM | POA: Insufficient documentation

## 2014-06-05 LAB — URINALYSIS, ROUTINE W REFLEX MICROSCOPIC
Bilirubin Urine: NEGATIVE
Glucose, UA: NEGATIVE mg/dL
Hgb urine dipstick: NEGATIVE
Ketones, ur: NEGATIVE mg/dL
Leukocytes, UA: NEGATIVE
Nitrite: NEGATIVE
Protein, ur: NEGATIVE mg/dL
Specific Gravity, Urine: 1.021 (ref 1.005–1.030)
Urobilinogen, UA: 1 mg/dL (ref 0.0–1.0)
pH: 7 (ref 5.0–8.0)

## 2014-06-05 MED ORDER — KETOROLAC TROMETHAMINE 30 MG/ML IJ SOLN
INTRAMUSCULAR | Status: AC
  Filled 2014-06-05: qty 1

## 2014-06-05 MED ORDER — METHOCARBAMOL 500 MG PO TABS: 500.0000 mg | ORAL_TABLET | Freq: Two times a day (BID) | ORAL | Status: DC

## 2014-06-05 MED ORDER — DIAZEPAM 5 MG/ML IJ SOLN
10.0000 mg | Freq: Once | INTRAMUSCULAR | Status: AC
  Administered 2014-06-05: 10 mg via INTRAVENOUS
  Filled 2014-06-05: qty 2

## 2014-06-05 MED ORDER — OXYCODONE-ACETAMINOPHEN 5-325 MG PO TABS
2.0000 | ORAL_TABLET | Freq: Once | ORAL | Status: DC
  Filled 2014-06-05 (×2): qty 2

## 2014-06-05 MED ORDER — OXYCODONE-ACETAMINOPHEN 5-325 MG PO TABS
2.0000 | ORAL_TABLET | Freq: Once | ORAL | Status: AC
  Administered 2014-06-05: 2 via ORAL

## 2014-06-05 MED ORDER — KETOROLAC TROMETHAMINE 30 MG/ML IJ SOLN
30.0000 mg | Freq: Once | INTRAMUSCULAR | Status: AC
  Administered 2014-06-05: 30 mg via INTRAMUSCULAR

## 2014-06-05 NOTE — ED Notes (Signed)
[facility]  For for further eval of RFT flank pain . Pt denies any injury to area . Pt does report rt groin pain 2 days ago. No reported HX of renal stones.

## 2014-06-05 NOTE — ED Notes (Signed)
Pt remains monitored by blood pressure and pulse ox. pts family remains at bedside.  

## 2014-06-05 NOTE — ED Notes (Signed)
toradol   given  l  uoq  Deep  im
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
## 2014-06-05 NOTE — ED Notes (Signed)
Notified OMalley PA of pt pain status.

## 2014-06-05 NOTE — ED Provider Notes (Addendum)
CSN: [id number]     Arrival date & time 06/05/14  [id number] History   First MD Initiated Contact with Patient 06/05/14 1129     Chief Complaint  Patient presents with  . Back Pain   (Consider location/radiation/quality/duration/timing/severity/associated sxs/prior Treatment) Patient is a 54 y.o. female presenting with back pain. The history is provided by the patient and the spouse.  Back Pain Location:  Lumbar spine Quality:  Shooting Radiates to:  Does not radiate Pain severity:  Moderate Onset quality:  Sudden Progression:  Worsening Chronicity:  New Context: emotional stress   Context comment:  Pt tried to attribute to sleeping wrong in chair durin husband's recent hosp for cva. Worsened by:  Touching, standing, sitting, lying down, bending and ambulation Ineffective treatments:  Muscle relaxants and NSAIDs Associated symptoms: no abdominal pain, no abdominal swelling, no bladder incontinence, no bowel incontinence, no dysuria, no fever and no leg pain   Risk factors: lack of exercise     Past Medical History  Diagnosis Date  . HIV (human immunodeficiency virus infection)   . Hypertension   . Asthma   . TB (pulmonary tuberculosis) 1989    was exposed and treated  . UTI (urinary tract infection) 1997  . Depression   . Bipolar 1 disorder 2009  . GERD (gastroesophageal reflux disease)    Past Surgical History  Procedure Laterality Date  . Abdominal hysterectomy     Family History  Problem Relation Age of Onset  . Coronary artery disease Mother   . Leukemia Father    History  Substance Use Topics  . Smoking status: Current Every Day Smoker -- 0.30 packs/day for 40 years    Types: Cigarettes  . Smokeless tobacco: Never Used     Comment: wanting "some pills" to help her stop  . Alcohol Use: No     Comment: not ETOH since June 5th   OB History    No data available     Review of Systems  Constitutional: Positive for activity change. Negative for fever.    Cardiovascular: Negative.   Gastrointestinal: Negative.  Negative for abdominal pain and bowel incontinence.  Genitourinary: Negative.  Negative for bladder incontinence and dysuria.  Musculoskeletal: Positive for back pain.    Allergies  Review of patient's allergies indicates no known allergies.  Home Medications   Prior to Admission medications   Medication Sig Start Date End Date Taking? Authorizing Provider  cyclobenzaprine (FLEXERIL) 5 MG tablet Take 1 tablet (5 mg total) by mouth 2 (two) times daily as needed for muscle spasms. 06/01/14   Mathis FareJennifer Lee H Presson, PA  divalproex (DEPAKOTE) 500 MG DR tablet Take 500 mg by mouth 2 (two) times daily. Take one tablet by mouth in the morning and 2 tablets by mouth each evening    Historical Provider, MD  elvitegravir-cobicistat-emtricitabine-tenofovir (GENVOYA) 150-150-200-10 MG TABS tablet Take 1 tablet by mouth daily with breakfast. 05/31/14   Cliffton AstersJohn Campbell, MD  hydrochlorothiazide (HYDRODIURIL) 25 MG tablet Take 1 tablet (25 mg total) by mouth daily. 03/08/14   Cliffton AstersJohn Campbell, MD  lisinopril (PRINIVIL,ZESTRIL) 20 MG tablet TAKE 1 TABLET BY MOUTH DAILY 03/28/14   Cliffton AstersJohn Campbell, MD  naproxen (NAPROSYN) 375 MG tablet Take 1 tablet (375 mg total) by mouth 2 (two) times daily. As needed for back pain 06/01/14   Mathis FareJennifer Lee H Presson, PA  QUEtiapine (SEROQUEL) 100 MG tablet Take 100 mg by mouth at bedtime.    Historical Provider, MD  silver sulfADIAZINE (SILVADENE) 1 % cream  Apply 1 application topically daily. To burn on lower back. Cover with dry dressing 06/01/14   Mathis FareJennifer Lee H Presson, PA  zolpidem (AMBIEN) 5 MG tablet Take 5 mg by mouth at bedtime as needed for sleep.    Historical Provider, MD   BP 156/74 mmHg  Pulse 74  Temp(Src) 97.8 F (36.6 C) (Oral)  Resp 18  SpO2 98% Physical Exam  Constitutional: She is oriented to person, place, and time. She appears well-developed and well-nourished. She appears distressed.  Pulmonary/Chest:  Effort normal and breath sounds normal.  Abdominal: Soft. Bowel sounds are normal.  Musculoskeletal: She exhibits tenderness.       Back:  Neurological: She is alert and oriented to person, place, and time.  Skin: Skin is warm and dry.  Burn resolving on right buttock.  Nursing note and vitals reviewed.   ED Course  Procedures (including critical care time) Labs Review Labs Reviewed - No data to display  Imaging Review No results found.   MDM   1. Flank pain, acute    Sent for further eval of right flank pain, seen here 5/11 and sx worse. Pain 12/10.   Linna HoffJames D Cortasia Screws, MD 06/05/14 1203  Linna HoffJames D Tamesha Ellerbrock, MD 06/05/14 [phone]
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
## 2014-06-05 NOTE — ED Notes (Signed)
Pt returned from xray-- writhing in pain, moaning, pain 10/10.

## 2014-06-05 NOTE — ED Notes (Signed)
Pt ambulated in the hallway. Pt complained of sharp hip pain while walking [facility]

## 2014-06-07 ENCOUNTER — Encounter (HOSPITAL_COMMUNITY): Payer: Self-pay

## 2014-06-07 ENCOUNTER — Emergency Department (HOSPITAL_COMMUNITY)
Admission: EM | Admit: 2014-06-07 | Discharge: 2014-06-07 | Disposition: A | Discharge status: Home / Self Care | Payer: Self-pay | Attending: Emergency Medicine | Admitting: Emergency Medicine

## 2014-06-07 DIAGNOSIS — Z21 Asymptomatic human immunodeficiency virus [HIV] infection status: Secondary | ICD-10-CM | POA: Insufficient documentation

## 2014-06-07 DIAGNOSIS — Z8619 Personal history of other infectious and parasitic diseases: Secondary | ICD-10-CM | POA: Insufficient documentation

## 2014-06-07 DIAGNOSIS — Z87440 Personal history of urinary (tract) infections: Secondary | ICD-10-CM | POA: Insufficient documentation

## 2014-06-07 DIAGNOSIS — R200 Anesthesia of skin: Secondary | ICD-10-CM | POA: Insufficient documentation

## 2014-06-07 DIAGNOSIS — Z79899 Other long term (current) drug therapy: Secondary | ICD-10-CM | POA: Insufficient documentation

## 2014-06-07 DIAGNOSIS — X19XXXD Contact with other heat and hot substances, subsequent encounter: Secondary | ICD-10-CM | POA: Insufficient documentation

## 2014-06-07 DIAGNOSIS — I10 Essential (primary) hypertension: Secondary | ICD-10-CM | POA: Insufficient documentation

## 2014-06-07 DIAGNOSIS — Z720 Tobacco use: Secondary | ICD-10-CM | POA: Insufficient documentation

## 2014-06-07 DIAGNOSIS — T2125XD Burn of second degree of buttock, subsequent encounter: Secondary | ICD-10-CM | POA: Insufficient documentation

## 2014-06-07 DIAGNOSIS — Z8719 Personal history of other diseases of the digestive system: Secondary | ICD-10-CM | POA: Insufficient documentation

## 2014-06-07 DIAGNOSIS — J45909 Unspecified asthma, uncomplicated: Secondary | ICD-10-CM | POA: Insufficient documentation

## 2014-06-07 DIAGNOSIS — F329 Major depressive disorder, single episode, unspecified: Secondary | ICD-10-CM | POA: Insufficient documentation

## 2014-06-07 DIAGNOSIS — M5431 Sciatica, right side: Secondary | ICD-10-CM | POA: Insufficient documentation

## 2014-06-07 MED ORDER — HYDROCODONE-ACETAMINOPHEN 5-325 MG PO TABS: 2.0000 | ORAL_TABLET | ORAL | Status: DC | PRN

## 2014-06-07 MED ORDER — HYDROMORPHONE HCL 1 MG/ML IJ SOLN
2.0000 mg | Freq: Once | INTRAMUSCULAR | Status: AC
  Administered 2014-06-07: 2 mg via INTRAMUSCULAR
  Filled 2014-06-07: qty 2

## 2014-06-07 MED ORDER — ONDANSETRON HCL 4 MG/2ML IJ SOLN
4.0000 mg | Freq: Once | INTRAMUSCULAR | Status: AC
  Administered 2014-06-07: 4 mg via INTRAMUSCULAR
  Filled 2014-06-07: qty 2

## 2014-06-07 MED ORDER — PREDNISONE 10 MG PO TABS: ORAL_TABLET | ORAL | Status: DC

## 2014-06-07 NOTE — ED Notes (Signed)
Pt has been having lower right back pain for the past two weeks and it isn't getting any better. Was seen yesterday and had urine and xrays checked. Taking robaxin and it's not helping.

## 2014-06-07 NOTE — Progress Notes (Signed)
Buddy DutyFelicia Salinas [facility] [phone]  Spoke with patient regarding primary care and [facility]. Patient is currently established with the [facility]. Orange card application provided and explained. My contact information also given for any future questions or concerns. No other [facility] needs identified at this time.

## 2014-06-07 NOTE — ED Provider Notes (Signed)
CSN: [id number]     Arrival date & time 06/07/14  1220 History  This chart was scribed for Trisha MangleKaren Sophia, PA-C, working with Purvis SheffieldForrest Harrison, MD by Elon SpannerGarrett Cook, ED Scribe. This patient was seen in room TR07C/TR07C and the patient's care was started at 2:00 PM.    Chief Complaint  Patient presents with  . Back Pain   No language interpreter was used.   HPI Comments: Daisy Salinas is a 54 y.o. female who presents to the Emergency Department complaining of intermittent right lower back pain with slight numbness down the back of her right legs onset 2 weeks ago.  The pain is worsened and described as stabbing when she straightens herself from the fetal position.   She has been seen 3 previous times for this complaint: once on 5/11 at Urgent care, once on 5/15 at Urgent Care, and once on 5/15 in the ED.  On 5/11 she was prescribed naproxen and flexeril.  On 5/15 in the ED, negative imaging of the back was performed, she was given valium and prescribed Robaxin which she has taken without relief.  She was also instructed to f/u with Dr. Roda ShuttersXu, who follows her for HIV, and was given a referral to an orthopaedist.  She has not sought f/u.   PCP: none  Past Medical History  Diagnosis Date  . HIV (human immunodeficiency virus infection)   . Hypertension   . Asthma   . TB (pulmonary tuberculosis) 1989    was exposed and treated  . UTI (urinary tract infection) 1997  . Depression   . Bipolar 1 disorder 2009  . GERD (gastroesophageal reflux disease)    Past Surgical History  Procedure Laterality Date  . Abdominal hysterectomy     Family History  Problem Relation Age of Onset  . Coronary artery disease Mother   . Leukemia Father    History  Substance Use Topics  . Smoking status: Current Every Day Smoker -- 0.30 packs/day for 40 years    Types: Cigarettes  . Smokeless tobacco: Never Used     Comment: wanting "some pills" to help her stop  . Alcohol Use: No     Comment: not ETOH since June  5th   OB History    No data available     Review of Systems  Musculoskeletal: Positive for back pain.  All other systems reviewed and are negative.     Allergies  Review of patient's allergies indicates no known allergies.  Home Medications   Prior to Admission medications   Medication Sig Start Date End Date Taking? Authorizing Provider  divalproex (DEPAKOTE) 500 MG DR tablet Take 500 mg by mouth in the morning and 1000 mg tablets by mouth each evening    Historical Provider, MD  elvitegravir-cobicistat-emtricitabine-tenofovir (GENVOYA) 150-150-200-10 MG TABS tablet Take 1 tablet by mouth daily with breakfast. 05/31/14   Cliffton AstersJohn Campbell, MD  hydrochlorothiazide (HYDRODIURIL) 25 MG tablet Take 1 tablet (25 mg total) by mouth daily. 03/08/14   Cliffton AstersJohn Campbell, MD  lisinopril (PRINIVIL,ZESTRIL) 20 MG tablet TAKE 1 TABLET BY MOUTH DAILY 03/28/14   Cliffton AstersJohn Campbell, MD  methocarbamol (ROBAXIN) 500 MG tablet Take 1 tablet (500 mg total) by mouth 2 (two) times daily. 06/05/14   Junius FinnerErin O'Malley, PA-C  naproxen (NAPROSYN) 375 MG tablet Take 1 tablet (375 mg total) by mouth 2 (two) times daily. As needed for back pain 06/01/14   Ria ClockJennifer Lee H Presson, PA  QUEtiapine Fumarate (SEROQUEL XR) 150 MG 24 hr tablet Take  150 mg in the morning and 300 mg at bedtime    Historical Provider, MD  silver sulfADIAZINE (SILVADENE) 1 % cream Apply 1 application topically daily. To burn on lower back. Cover with dry dressing 06/01/14   Jess BartersJennifer Lee H Presson, PA  TIVICAY 50 MG tablet Take 50 mg by mouth daily. 05/29/14   Historical Provider, MD  TRUVADA 200-300 MG per tablet Take 1 tablet by mouth daily. 05/29/14   Historical Provider, MD  zolpidem (AMBIEN) 5 MG tablet Take 5 mg by mouth at bedtime as needed for sleep.    Historical Provider, MD   BP 135/96 mmHg  Pulse 81  Temp(Src) 97.9 F (36.6 C)  Resp 18  Ht 4\' 11"  (1.499 m)  Wt 162 lb (73.483 kg)  BMI 32.70 kg/m2  SpO2 98% Physical Exam  Constitutional: She is  oriented to person, place, and time. She appears well-developed and well-nourished. No distress.  HENT:  Head: Normocephalic and atraumatic.  Eyes: Conjunctivae and EOM are normal.  Neck: Neck supple. No tracheal deviation present.  Cardiovascular: Normal rate.   Pulmonary/Chest: Effort normal. No respiratory distress.  Musculoskeletal: Normal range of motion.  Pain with ROM  Neurological: She is alert and oriented to person, place, and time.  Skin: Skin is warm and dry.  2nd degree burn right buttock 2 cm round.    Psychiatric: She has a normal mood and affect. Her behavior is normal.  Nursing note and vitals reviewed.   ED Course  Procedures (including critical care time)  DIAGNOSTIC STUDIES: Oxygen Saturation is 98% on RA, normal by my interpretation.    COORDINATION OF CARE:    Labs Review Labs Reviewed - No data to display  Imaging Review Dg Hip Unilat With Pelvis 2-3 Views Right  06/05/2014   CLINICAL DATA:  Right hip pain without known injury. Initial encounter.  EXAM: RIGHT HIP (WITH PELVIS) 2-3 VIEWS  COMPARISON:  None.  FINDINGS: There is no evidence of hip fracture or dislocation. There is no evidence of arthropathy or other focal bone abnormality.  IMPRESSION: Normal right hip.   Electronically Signed   By: Lupita RaiderJames  Green Jr, M.D.   On: 06/05/2014 14:55     EKG Interpretation None      MDM   Final diagnoses:  Sciatica, right   Pt given dilaudid and zofran here.   Rx for hydrocodone and prednisone taper Automotive engineerocail worker spoke with pt.   Pt to follow up with wellness clinic for orange card,    Elson AreasLeslie K Tava Peery, PA-C 06/07/14 1514  Purvis SheffieldForrest Harrison, MD 06/08/14 1252

## 2014-06-07 NOTE — Discharge Instructions (Signed)
Sciatica Sciatica is pain, weakness, numbness, or tingling along the path of the sciatic nerve. The nerve starts in the lower back and runs down the back of each leg. The nerve controls the muscles in the lower leg and in the back of the knee, while also providing sensation to the back of the thigh, lower leg, and the sole of your foot. Sciatica is a symptom of another medical condition. For instance, nerve damage or certain conditions, such as a herniated disk or bone spur on the spine, pinch or put pressure on the sciatic nerve. This causes the pain, weakness, or other sensations normally associated with sciatica. Generally, sciatica only affects one side of the body. CAUSES   Herniated or slipped disc.  Degenerative disk disease.  A pain disorder involving the narrow muscle in the buttocks (piriformis syndrome).  Pelvic injury or fracture.  Pregnancy.  Tumor (rare). SYMPTOMS  Symptoms can vary from mild to very severe. The symptoms usually travel from the low back to the buttocks and down the back of the leg. Symptoms can include:  Mild tingling or dull aches in the lower back, leg, or hip.  Numbness in the back of the calf or sole of the foot.  Burning sensations in the lower back, leg, or hip.  Sharp pains in the lower back, leg, or hip.  Leg weakness.  Severe back pain inhibiting movement. These symptoms may get worse with coughing, sneezing, laughing, or prolonged sitting or standing. Also, being overweight may worsen symptoms. DIAGNOSIS  Your caregiver will perform a physical exam to look for common symptoms of sciatica. He or she may ask you to do certain movements or activities that would trigger sciatic nerve pain. Other tests may be performed to find the cause of the sciatica. These may include:  Blood tests.  X-rays.  Imaging tests, such as an MRI or CT scan. TREATMENT  Treatment is directed at the cause of the sciatic pain. Sometimes, treatment is not necessary  and the pain and discomfort goes away on its own. If treatment is needed, your caregiver may suggest:  Over-the-counter medicines to relieve pain.  Prescription medicines, such as anti-inflammatory medicine, muscle relaxants, or narcotics.  Applying heat or ice to the painful area.  Steroid injections to lessen pain, irritation, and inflammation around the nerve.  Reducing activity during periods of pain.  Exercising and stretching to strengthen your abdomen and improve flexibility of your spine. Your caregiver may suggest losing weight if the extra weight makes the back pain worse.  Physical therapy.  Surgery to eliminate what is pressing or pinching the nerve, such as a bone spur or part of a herniated disk. HOME CARE INSTRUCTIONS   Only take over-the-counter or prescription medicines for pain or discomfort as directed by your caregiver.  Apply ice to the affected area for 20 minutes, 3-4 times a day for the first 48-72 hours. Then try heat in the same way.  Exercise, stretch, or perform your usual activities if these do not aggravate your pain.  Attend physical therapy sessions as directed by your caregiver.  Keep all follow-up appointments as directed by your caregiver.  Do not wear high heels or shoes that do not provide proper support.  Check your mattress to see if it is too soft. A firm mattress may lessen your pain and discomfort. SEEK IMMEDIATE MEDICAL CARE IF:   You lose control of your bowel or bladder (incontinence).  You have increasing weakness in the lower back, pelvis, buttocks,   or legs.  You have redness or swelling of your back.  You have a burning sensation when you urinate.  You have pain that gets worse when you lie down or awakens you at night.  Your pain is worse than you have experienced in the past.  Your pain is lasting longer than 4 weeks.  You are suddenly losing weight without reason. MAKE SURE YOU:  Understand these  instructions.  Will watch your condition.  Will get help right away if you are not doing well or get worse. Document Released: 01/01/2001 Document Revised: 07/09/2011 Document Reviewed: 05/19/2011 Exit[employer] Patient Information 2015 [employer]. This information is not intended to replace advice given to you by your health care provider. Make sure you discuss any questions you have with your health care provider.  

## 2014-08-10 ENCOUNTER — Other Ambulatory Visit (INDEPENDENT_AMBULATORY_CARE_PROVIDER_SITE_OTHER): Payer: Self-pay

## 2014-08-10 DIAGNOSIS — B182 Chronic viral hepatitis C: Secondary | ICD-10-CM

## 2014-08-11 LAB — HEPATITIS C RNA QUANTITATIVE: HCV Quantitative: NOT DETECTED IU/mL (ref <15)

## 2014-08-22 ENCOUNTER — Other Ambulatory Visit: Payer: Self-pay | Admitting: Pharmacist Clinician (PhC)/ Clinical Pharmacy Specialist

## 2014-08-22 ENCOUNTER — Telehealth: Payer: Self-pay | Admitting: Pharmacist Clinician (PhC)/ Clinical Pharmacy Specialist

## 2014-08-22 NOTE — Telephone Encounter (Signed)
Daisy Salinas called to clarify her ART. She was previously on DTG + TRV while she was on Harvoni. Dr. Orvan Falconer simplified her regimen to [facility]. She was confused about why she needs all of those meds. I clean up her profile (delete DTG/TRV). Told her that she only needs to be on Genvoya only.

## 2014-08-30 ENCOUNTER — Ambulatory Visit (INDEPENDENT_AMBULATORY_CARE_PROVIDER_SITE_OTHER): Payer: Self-pay | Admitting: Internal Medicine

## 2014-08-30 ENCOUNTER — Encounter: Payer: Self-pay | Admitting: Internal Medicine

## 2014-08-30 VITALS — BP 132/85 | HR 67 | Temp 97.8°F | Wt 169.2 lb

## 2014-08-30 DIAGNOSIS — F329 Major depressive disorder, single episode, unspecified: Secondary | ICD-10-CM

## 2014-08-30 DIAGNOSIS — B20 Human immunodeficiency virus [HIV] disease: Secondary | ICD-10-CM

## 2014-08-30 DIAGNOSIS — Z720 Tobacco use: Secondary | ICD-10-CM

## 2014-08-30 DIAGNOSIS — F32A Depression, unspecified: Secondary | ICD-10-CM

## 2014-08-30 DIAGNOSIS — B182 Chronic viral hepatitis C: Secondary | ICD-10-CM

## 2014-08-30 DIAGNOSIS — F17200 Nicotine dependence, unspecified, uncomplicated: Secondary | ICD-10-CM

## 2014-08-30 NOTE — Assessment & Plan Note (Signed)
She has completed therapy for her chronic hepatitis C and her last for hepatitis C viral loads have been undetectable. I strongly suspect her hepatitis has been cured.

## 2014-08-30 NOTE — Assessment & Plan Note (Signed)
Her HIV infection has come back under excellent control since restarting Genvoya. I encouraged her to keep up with her ADAP certification. She will follow-up after blood work in 6 months.

## 2014-08-30 NOTE — Assessment & Plan Note (Signed)
Her depression has resolved coincident with getting back on anti-retroviral therapy and staying off of cocaine.

## 2014-08-30 NOTE — Progress Notes (Signed)
Patient Active Problem List   Diagnosis Date Noted  . Chronic hepatitis C without hepatic coma 09/08/2007    Priority: High  . Human immunodeficiency virus (HIV) disease 10/29/2005    Priority: High  . SUBSTANCE ABUSE, MULTIPLE 11/30/2009  . BRONCHITIS, CHRONIC 01/03/2009  . CONSTIPATION, CHRONIC 05/09/2008  . DE QUERVAIN'S TENOSYNOVITIS, LEFT WRIST 05/09/2008  . HYSTERECTOMY, HX OF 05/13/2006  . Depression 05/06/2006  . SYPHILIS, LATE, LATENT 10/29/2005  . DYSLIPIDEMIA 10/29/2005  . CIGARETTE SMOKER 10/29/2005  . HYPERTENSION 10/29/2005  . PROLAPSE, VAGINAL WALL NOS 10/29/2005  . MENORRHAGIA 10/29/2005  . INSOMNIA 10/29/2005    Patient's Medications  New Prescriptions   No medications on file  Previous Medications   DIVALPROEX (DEPAKOTE) 500 MG DR TABLET    Take 500 mg by mouth in the morning and 1000 mg tablets by mouth each evening   ELVITEGRAVIR-COBICISTAT-EMTRICITABINE-TENOFOVIR (GENVOYA) 150-150-200-10 MG TABS TABLET    Take 1 tablet by mouth daily with breakfast.   HYDROCHLOROTHIAZIDE (HYDRODIURIL) 25 MG TABLET    Take 1 tablet (25 mg total) by mouth daily.   HYDROCODONE-ACETAMINOPHEN (NORCO/VICODIN) 5-325 MG PER TABLET    Take 2 tablets by mouth every 4 (four) hours as needed.   LISINOPRIL (PRINIVIL,ZESTRIL) 20 MG TABLET    TAKE 1 TABLET BY MOUTH DAILY   METHOCARBAMOL (ROBAXIN) 500 MG TABLET    Take 1 tablet (500 mg total) by mouth 2 (two) times daily.   NAPROXEN (NAPROSYN) 375 MG TABLET    Take 1 tablet (375 mg total) by mouth 2 (two) times daily. As needed for back pain   PREDNISONE (DELTASONE) 10 MG TABLET    6,5,4,3,2,1 taper   QUETIAPINE FUMARATE (SEROQUEL XR) 150 MG 24 HR TABLET    Take 150 mg in the morning and 300 mg at bedtime   SILVER SULFADIAZINE (SILVADENE) 1 % CREAM    Apply 1 application topically daily. To burn on lower back. Cover with dry dressing   ZOLPIDEM (AMBIEN) 5 MG TABLET    Take 5 mg by mouth at bedtime as needed for sleep.    Modified Medications   No medications on file  Discontinued Medications   No medications on file    Subjective: Clarice is in for her routine HIV follow-up visit. She has been back on her Genvoya for several months. She is taking it correctly with breakfast each morning and has not missed any doses. She remains sober and off cocaine. She continues to smoke a half pack of cigarettes daily. She states that it's hard to quit because her husband, [name], continues to smoke as well. She does have a desire to quit.   Review of Systems: Constitutional: positive for fatigue, negative for anorexia, chills, fevers, sweats and weight loss Eyes: negative Ears, nose, mouth, throat, and face: negative Respiratory: negative Cardiovascular: negative Gastrointestinal: negative Genitourinary:negative  Past Medical History  Diagnosis Date  . HIV (human immunodeficiency virus infection)   . Hypertension   . Asthma   . TB (pulmonary tuberculosis) 1989    was exposed and treated  . UTI (urinary tract infection) 1997  . Depression   . Bipolar 1 disorder 2009  . GERD (gastroesophageal reflux disease)     History  Substance Use Topics  . Smoking status: Current Every Day Smoker -- 0.50 packs/day for 40 years    Types: Cigarettes  . Smokeless tobacco: Never Used     Comment: wanting "some pills" to help her stop  .  Alcohol Use: No     Comment: not ETOH since June 5th    Family History  Problem Relation Age of Onset  . Coronary artery disease Mother   . Leukemia Father     No Known Allergies  Objective:  Filed Vitals:   08/30/14 0930  BP: 132/85  Pulse: 67  Temp: 97.8 F (36.6 C)  TempSrc: Oral  Weight: 169 lb 4 oz (76.771 kg)   Body mass index is 34.17 kg/(m^2).  General: She is in no distress Oral: No oropharyngeal lesions Skin: No rash Lungs: Clear Cor: Regular S1 and S2 with no murmurs Abdomen: Obese, soft and nontender Joints and extremities: Normal Neuro: Alert with  normal speech and conversation Mood: Normal. She is happier than usual. She does not appear anxious or depressed  Lab Results Lab Results  Component Value Date   WBC 5.5 05/16/2014   HGB 12.7 05/16/2014   HCT 36.3 05/16/2014   MCV 95.5 05/16/2014   PLT 120* 05/16/2014    Lab Results  Component Value Date   CREATININE 0.73 05/16/2014   BUN 11 05/16/2014   NA 138 05/16/2014   K 3.5 05/16/2014   CL 103 05/16/2014   CO2 25 05/16/2014    Lab Results  Component Value Date   ALT <8 05/16/2014   AST 25 05/16/2014   ALKPHOS 62 05/16/2014   BILITOT 0.4 05/16/2014    Lab Results  Component Value Date   CHOL 153 09/20/2013   HDL 31* 09/20/2013   LDLCALC 85 09/20/2013   TRIG 186* 09/20/2013   CHOLHDL 4.9 09/20/2013    Lab Results HIV 1 RNA QUANT (copies/mL)  Date Value  05/16/2014 <20  12/27/2013 3974*  09/20/2013 3857*   CD4 T CELL ABS (/uL)  Date Value  05/16/2014 1380  12/27/2013 1210  09/20/2013 1640     Problem List Items Addressed This Visit      High   Chronic hepatitis C without hepatic coma - Primary    She has completed therapy for her chronic hepatitis C and her last for hepatitis C viral loads have been undetectable. I strongly suspect her hepatitis has been cured.      Human immunodeficiency virus (HIV) disease    Her HIV infection has come back under excellent control since restarting Genvoya. I encouraged her to keep up with her ADAP certification. She will follow-up after blood work in 6 months.      Relevant Orders   CBC   T-helper cell (CD4)- ([facility] only)   Comprehensive metabolic panel   Lipid panel   RPR   HIV 1 RNA quant-no reflex-bld     Unprioritized   CIGARETTE SMOKER    She has some desire to quit. I encouraged her to continue to work on developing a plan to quit and try to set a quit date.      Depression    Her depression has resolved coincident with getting back on anti-retroviral therapy and staying off of cocaine.            Cliffton Asters, MD [facility] [phone] pager   [phone] cell 08/30/2014, 10:07 AM

## 2014-08-30 NOTE — Assessment & Plan Note (Signed)
She has some desire to quit. I encouraged her to continue to work on developing a plan to quit and try to set a quit date.

## 2014-09-12 ENCOUNTER — Other Ambulatory Visit: Payer: Self-pay | Admitting: Internal Medicine

## 2014-09-12 DIAGNOSIS — I10 Essential (primary) hypertension: Secondary | ICD-10-CM

## 2014-10-18 ENCOUNTER — Ambulatory Visit (INDEPENDENT_AMBULATORY_CARE_PROVIDER_SITE_OTHER): Payer: Self-pay | Admitting: *Deleted

## 2014-10-18 DIAGNOSIS — Z23 Encounter for immunization: Secondary | ICD-10-CM

## 2014-10-19 NOTE — Progress Notes (Signed)
Approval faxed to [facility]. Howell, Michelle M, RN  

## 2014-11-07 ENCOUNTER — Ambulatory Visit: Payer: Self-pay | Admitting: Internal Medicine

## 2014-11-13 ENCOUNTER — Encounter (HOSPITAL_COMMUNITY): Payer: Self-pay | Admitting: Emergency Medicine

## 2014-11-13 ENCOUNTER — Emergency Department (INDEPENDENT_AMBULATORY_CARE_PROVIDER_SITE_OTHER)
Admission: EM | Admit: 2014-11-13 | Discharge: 2014-11-13 | Disposition: A | Discharge status: Home / Self Care | Payer: Self-pay | Source: Home / Self Care | Attending: Family Medicine | Admitting: Family Medicine

## 2014-11-13 DIAGNOSIS — M654 Radial styloid tenosynovitis [de Quervain]: Secondary | ICD-10-CM

## 2014-11-13 DIAGNOSIS — M25531 Pain in right wrist: Secondary | ICD-10-CM

## 2014-11-13 MED ORDER — OXYCODONE-ACETAMINOPHEN 5-325 MG PO TABS: 1.0000 | ORAL_TABLET | Freq: Three times a day (TID) | ORAL | Status: DC | PRN

## 2014-11-13 NOTE — ED Provider Notes (Addendum)
CSN: [id number]     Arrival date & time 11/13/14  1303 History   First MD Initiated Contact with Patient 11/13/14 1327     No chief complaint on file.  (Consider location/radiation/quality/duration/timing/severity/associated sxs/prior Treatment) Patient is a 54 y.o. female presenting with wrist pain. The history is provided by the patient. No language interpreter was used.  Wrist Pain This is a new problem. Episode onset: Pain of her right wrist started 3 wks ago. The problem occurs constantly. The problem has been gradually worsening. Pertinent negatives include no chest pain, no abdominal pain, no headaches and no shortness of breath. The symptoms are aggravated by twisting and bending. The symptoms are relieved by rest. She has tried rest, a cold compress and a warm compress for the symptoms. The treatment provided mild relief.  She is right handed, a home maker and she does a lot of cooking. Pain is impacting her daily activity. Pain is about 10/10 in severity. She uses Motrin 800 mg without any improvement. She denies any trauma or injury to her wrist. She had similar presentation 4 years ago and was told she had carpel tunnel then. She denies numbness or tingling sensation this time.  HTN: Patient is yet to take her BP meds today per nursing.   Past Medical History  Diagnosis Date  . HIV (human immunodeficiency virus infection)   . Hypertension   . Asthma   . TB (pulmonary tuberculosis) 1989    was exposed and treated  . UTI (urinary tract infection) 1997  . Depression   . Bipolar 1 disorder 2009  . GERD (gastroesophageal reflux disease)    Past Surgical History  Procedure Laterality Date  . Abdominal hysterectomy     Family History  Problem Relation Age of Onset  . Coronary artery disease Mother   . Leukemia Father    Social History  Substance Use Topics  . Smoking status: Current Every Day Smoker -- 0.50 packs/day for 40 years    Types: Cigarettes  . Smokeless tobacco:  Never Used     Comment: wanting "some pills" to help her stop  . Alcohol Use: No     Comment: not ETOH since June 5th   OB History    No data available     Review of Systems  Respiratory: Negative.  Negative for shortness of breath.   Cardiovascular: Negative.  Negative for chest pain.  Gastrointestinal: Negative for abdominal pain.  Musculoskeletal: Positive for arthralgias.       Right wrist pain  Neurological: Negative for headaches.  All other systems reviewed and are negative.   Allergies  Review of patient's allergies indicates no known allergies.  Home Medications   Prior to Admission medications   Medication Sig Start Date End Date Taking? Authorizing Provider  divalproex (DEPAKOTE) 500 MG DR tablet Take 500 mg by mouth in the morning and 1000 mg tablets by mouth each evening    Historical Provider, MD  dolutegravir (TIVICAY) 50 MG tablet Take 50 mg by mouth daily.    Historical Provider, MD  elvitegravir-cobicistat-emtricitabine-tenofovir (GENVOYA) 150-150-200-10 MG TABS tablet Take 1 tablet by mouth daily with breakfast. 05/31/14   Cliffton Asters, MD  hydrochlorothiazide (HYDRODIURIL) 25 MG tablet TAKE 1 TABLET BY MOUTH DAILY 09/12/14   Cliffton Asters, MD  HYDROcodone-acetaminophen (NORCO/VICODIN) 5-325 MG per tablet Take 2 tablets by mouth every 4 (four) hours as needed. 06/07/14   Elson Areas, PA-C  lisinopril (PRINIVIL,ZESTRIL) 20 MG tablet TAKE 1 TABLET BY MOUTH DAILY  09/12/14   Cliffton AstersJohn Campbell, MD  methocarbamol (ROBAXIN) 500 MG tablet Take 1 tablet (500 mg total) by mouth 2 (two) times daily. 06/05/14   Junius FinnerErin O'Malley, PA-C  naproxen (NAPROSYN) 375 MG tablet Take 1 tablet (375 mg total) by mouth 2 (two) times daily. As needed for back pain 06/01/14   Ria ClockJennifer Lee H Presson, PA  predniSONE (DELTASONE) 10 MG tablet 6,5,4,3,2,1 taper 06/07/14   Elson AreasLeslie K Sofia, PA-C  QUEtiapine Fumarate (SEROQUEL XR) 150 MG 24 hr tablet Take 150 mg in the morning and 300 mg at bedtime     Historical Provider, MD  sertraline (ZOLOFT) 50 MG tablet Take 50 mg by mouth daily.    Historical Provider, MD  silver sulfADIAZINE (SILVADENE) 1 % cream Apply 1 application topically daily. To burn on lower back. Cover with dry dressing 06/01/14   Mathis FareJennifer Lee H Presson, PA  zolpidem (AMBIEN) 5 MG tablet Take 5 mg by mouth at bedtime as needed for sleep.    Historical Provider, MD   Meds Ordered and Administered this Visit  Medications - No data to display  There were no vitals taken for this visit. No data found.   Physical Exam  Constitutional: She appears well-developed. No distress.  Cardiovascular: Normal rate and regular rhythm.   No murmur heard. Pulmonary/Chest: Effort normal and breath sounds normal. No respiratory distress. She has no wheezes. She has no rales.  Musculoskeletal:       Right wrist: She exhibits decreased range of motion and tenderness. She exhibits no bony tenderness, no swelling and no effusion.       Left wrist: Normal.       Arms: Nursing note and vitals reviewed.   ED Course  Procedures (including critical care time)  Labs Review Labs Reviewed - No data to display  Imaging Review No results found.   Visual Acuity Review  Right Eye Distance:   Left Eye Distance:   Bilateral Distance:    Right Eye Near:   Left Eye Near:    Bilateral Near:         MDM  No diagnosis found. Right wrist pain De Quervain Tenosynovitis of her right wrist.   Counseling done on disease process. Wrist brace given during this visit to wrest her wrist and thumb. Percocet prescribed prn pain. If no improvement follow up with PCP soon for PT referral and also she might benefit from steroid injection. She agreed with plan.  BP elevated today, might be partly due to nat taking her meds vs pain. Patient to continue current regimen and follow up with PCP soon for med adjustment.   Doreene ElandKehinde T Daniel Johndrow, MD 11/13/14 1355  Doreene ElandKehinde T Floride Hutmacher, MD 11/13/14 [phone]

## 2014-11-13 NOTE — Discharge Instructions (Signed)
De Quervain's Tenosynovitis Surgical Release Surgery for de Quervain's tenosynovitis is done to relieve swelling and pain in the wrist and thumb. The swelling and pain was caused by inflammation (the body's way of reacting to injury or infection). The inflammation occurred at the covering (sheath) around the tendons (tough cords of tissue). The sheath was opened during surgery. This opening gives the tendons more space to move. Once you recover from the surgery, the pain you used to feel should be improved. Normal movement of your wrist and thumb should return. Most of the time, surgery for de Quervain's tenosynovitis is an outpatient procedure. That means you will go home the same day. LET YOUR CAREGIVER KNOW ABOUT:   Any allergies.  Medicines taken including herbs, eyedrops, prescription medicines (especially blood thinners [anticoagulants]), aspirin and other over-the-counter medicines and steroids (by mouth or cream).  Previous problems with anesthetics or medicines used to numb the skin.  Possibility of pregnancy, if this applies.  Any history of blood clots in your legs or lungs.  Any history of bleeding or other blood problems.  Previous surgery.  Other important health problems. RISKS AND COMPLICATIONS  Pain at the incision site.  Blood that pools under the incision (hematoma).  Infection at the incision site.  Swelling.  Allergic reaction to the anesthesia.  A return of pain or stiffness in the thumb.  Rarely, nerve injury. BEFORE SURGERY  Two weeks before your surgery, stop using aspirin and non-steroidal anti-inflammatory drugs (NSAIDs) for pain relief. This includes prescription drugs and over-the-counter anti-inflammatory drugs. Also stop taking vitamin E.  If you take anticoagulants, ask your caregiver when you should stop taking them.  Stop smoking.  Do not eat or drink for about 8 hours before your surgery.  Arrive at least an hour before the surgery, or  whenever your surgeon recommends. This will give you time to check in and fill out any paperwork.  Your surgery will probably be an outpatient procedure. That means you will go home the same day. Make arrangements in advance for someone to drive you home. THE PROCEDURE  You will be given a local anesthetic or regional anesthetic so you do not feel pain. Your thumb and wrist area will be numbed, but you will still be awake.  The surgeon will make a cut (incision) in the wrist. Then, the sheath that wraps around the tendons will be opened. This gives the tendons inside the sheath more room. There will be less pressure on them, and that helps relieve the pain.  If there are small sacs filled with fluid (cysts) or other inflamed tissues, they can be removed during the surgery.  The incision will be closed with small stitches. This will be covered with a dressing (a piece of gauze).  A splint or brace will be put on your wrist and thumb area to keep it from moving. AFTER SURGERY  You will stay in a recovery area until the anesthesia has worn off. Your blood pressure and pulse will be checked often. Once your body functions are back to normal, you will be able to go home.  You will have a splint or brace on your wrist and thumb area.  The surgeon will give you a prescription for pain medication to keep you comfortable.  Before you are sent home, you will be shown how to care for the area around the cut (incision) that was made during surgery. Make sure you know how often the bandage (dressing) should be changed. You  also need to know when the splint and dressing can be taken off for good.  Ask whether you will need physical or occupational therapy. If so, ask about referrals.  Be patient. It will take a few weeks before you can use your wrist and thumb normally again.   This information is not intended to replace advice given to you by your health care provider. Make sure you discuss any  questions you have with your health care provider.   Document Released: 05/26/2008 Document Revised: 04/01/2011 Document Reviewed: 05/26/2008 Elsevier Interactive Patient Education [employer]2016 Elsevier Inc.

## 2014-11-13 NOTE — ED Notes (Signed)
[facility] with a complaint of right wrist pain that has been ongoing for 3 weeks. The patient denied any injury. The patient stated that she has tried ice and heat with no relief.

## 2014-12-07 ENCOUNTER — Emergency Department (INDEPENDENT_AMBULATORY_CARE_PROVIDER_SITE_OTHER)
Admission: EM | Admit: 2014-12-07 | Discharge: 2014-12-07 | Disposition: A | Discharge status: Home / Self Care | Payer: Self-pay | Source: Home / Self Care | Attending: Emergency Medicine | Admitting: Emergency Medicine

## 2014-12-07 DIAGNOSIS — M654 Radial styloid tenosynovitis [de Quervain]: Secondary | ICD-10-CM

## 2014-12-07 MED ORDER — DICLOFENAC SODIUM 1 % TD GEL: 2.0000 g | Freq: Four times a day (QID) | TRANSDERMAL | Status: DC

## 2014-12-07 MED ORDER — TRIAMCINOLONE ACETONIDE 40 MG/ML IJ SUSP
INTRAMUSCULAR | Status: AC
  Filled 2014-12-07: qty 1

## 2014-12-07 MED ORDER — LIDOCAINE HCL (PF) 2 % IJ SOLN
INTRAMUSCULAR | Status: AC
  Filled 2014-12-07: qty 2

## 2014-12-07 NOTE — ED Notes (Signed)
C/o right hand pain due to carpal tunnel States she would like a steroid shot in her wrist

## 2014-12-07 NOTE — ED Provider Notes (Addendum)
CSN: [id number]     Arrival date & time 12/07/14  1303 History   First MD Initiated Contact with Patient 12/07/14 1332     Chief Complaint  Patient presents with  . Hand Pain   (Consider location/radiation/quality/duration/timing/severity/associated sxs/prior Treatment) HPI  Daisy Salinas is a 54 year old woman here for follow-up of right wrist pain. Daisy Salinas was seen here 2 weeks ago and diagnosed with de Quervain's tenosynovitis of the right hand. Daisy Salinas is given a thumb spica brace which Daisy Salinas has been wearing. Daisy Salinas states it is still very painful. Daisy Salinas has pain at the radial aspect of the wrist just distal to the radial styloid. Daisy Salinas has pain with any sort of movement of her thumb.   Past Medical History  Diagnosis Date  . HIV (human immunodeficiency virus infection) (HCC)   . Hypertension   . Asthma   . TB (pulmonary tuberculosis) 1989    was exposed and treated  . UTI (urinary tract infection) 1997  . Depression   . Bipolar 1 disorder (HCC) 2009  . GERD (gastroesophageal reflux disease)    Past Surgical History  Procedure Laterality Date  . Abdominal hysterectomy     Family History  Problem Relation Age of Onset  . Coronary artery disease Mother   . Leukemia Father    Social History  Substance Use Topics  . Smoking status: Current Every Day Smoker -- 0.50 packs/day for 40 years    Types: Cigarettes  . Smokeless tobacco: Never Used     Comment: wanting "some pills" to help her stop  . Alcohol Use: No     Comment: not ETOH since June 5th   OB History    No data available     Review of Systems As in history of present illness Allergies  Review of patient's allergies indicates no known allergies.  Home Medications   Prior to Admission medications   Medication Sig Start Date End Date Taking? Authorizing Provider  diclofenac sodium (VOLTAREN) 1 % GEL Apply 2 g topically 4 (four) times daily. 12/07/14   Charm RingsErin J Devlon Dosher, MD  divalproex (DEPAKOTE) 500 MG DR tablet Take 500 mg by mouth in  the morning and 1000 mg tablets by mouth each evening    Historical Provider, MD  dolutegravir (TIVICAY) 50 MG tablet Take 50 mg by mouth daily.    Historical Provider, MD  elvitegravir-cobicistat-emtricitabine-tenofovir (GENVOYA) 150-150-200-10 MG TABS tablet Take 1 tablet by mouth daily with breakfast. 05/31/14   Cliffton AstersJohn Campbell, MD  hydrochlorothiazide (HYDRODIURIL) 25 MG tablet TAKE 1 TABLET BY MOUTH DAILY 09/12/14   Cliffton AstersJohn Campbell, MD  HYDROcodone-acetaminophen (NORCO/VICODIN) 5-325 MG per tablet Take 2 tablets by mouth every 4 (four) hours as needed. 06/07/14   Elson AreasLeslie K Sofia, PA-C  lisinopril (PRINIVIL,ZESTRIL) 20 MG tablet TAKE 1 TABLET BY MOUTH DAILY 09/12/14   Cliffton AstersJohn Campbell, MD  methocarbamol (ROBAXIN) 500 MG tablet Take 1 tablet (500 mg total) by mouth 2 (two) times daily. 06/05/14   Junius FinnerErin O'Malley, PA-C  naproxen (NAPROSYN) 375 MG tablet Take 1 tablet (375 mg total) by mouth 2 (two) times daily. As needed for back pain 06/01/14   Ria ClockJennifer Lee H Presson, PA  oxyCODONE-acetaminophen (PERCOCET/ROXICET) 5-325 MG tablet Take 1 tablet by mouth every 8 (eight) hours as needed for severe pain. 11/13/14   Doreene ElandKehinde T Eniola, MD  predniSONE (DELTASONE) 10 MG tablet 6,5,4,3,2,1 taper 06/07/14   Elson AreasLeslie K Sofia, PA-C  QUEtiapine Fumarate (SEROQUEL XR) 150 MG 24 hr tablet Take 150 mg in the morning and  300 mg at bedtime    Historical Provider, MD  sertraline (ZOLOFT) 50 MG tablet Take 50 mg by mouth daily.    Historical Provider, MD  silver sulfADIAZINE (SILVADENE) 1 % cream Apply 1 application topically daily. To burn on lower back. Cover with dry dressing 06/01/14   Mathis Fare Presson, PA  zolpidem (AMBIEN) 5 MG tablet Take 5 mg by mouth at bedtime as needed for sleep.    Historical Provider, MD   Meds Ordered and Administered this Visit  Medications - No data to display  BP 120/70 mmHg  Pulse 73  Temp(Src) 98.1 F (36.7 C) (Oral)  Resp 16  SpO2 100% No data found.   Physical Exam  Constitutional:  Daisy Salinas is oriented to person, place, and time. Daisy Salinas appears well-developed and well-nourished. No distress.  Neck: Neck supple.  Cardiovascular: Normal rate.   Pulmonary/Chest: Effort normal.  Musculoskeletal:  Right wrist: No erythema or edema. Daisy Salinas is tender in the radial wrist just distal to the radial styloid. Range of motion of thumb is limited due to pain. Brisk cap refill in the thumb.  Neurological: Daisy Salinas is alert and oriented to person, place, and time.    ED Course  Injection tendon or ligament Date/Time: 12/07/2014 2:24 PM Performed by: Charm Rings Authorized by: Charm Rings Consent: Verbal consent obtained. Risks and benefits: risks, benefits and alternatives were discussed Consent given by: patient Patient understanding: patient states understanding of the procedure being performed Patient identity confirmed: verbally with patient Time out: Immediately prior to procedure a "time out" was called to verify the correct patient, procedure, equipment, support staff and site/side marked as required. Local anesthesia used: Cold spray. Patient tolerance: Patient tolerated the procedure well with no immediate complications Comments: Skin was cleaned with alcohol. Cold spray was used for local anesthetic. 1 mL of 2% lidocaine with 20 mg of Kenalog was injected into the first extensor compartment of the right wrist. Patient tolerated procedure well with no immediate complication.   (including critical care time)  Labs Review Labs Reviewed - No data to display  Imaging Review No results found.    MDM   1. Tenosynovitis, de Quervain    Steroid injection done today.  Discussed that this injection should not be repeated very often due to possible tendon degradation. Recommended continued use of the brace for the next several days. Prescription given for Voltaren gel to use 4 times a day. Recommended follow-up at the sports medicine center if her pain does not improve over the next  week.    Charm Rings, MD 12/07/14 1426  Charm Rings, MD 12/07/14 [phone]

## 2014-12-07 NOTE — Discharge Instructions (Signed)
De Quervain Tenosynovitis °Tendons attach muscles to bones. They also help with joint movements. When tendons become irritated or swollen, it is called tendinitis. °The extensor pollicis brevis (EPB) tendon connects the EPB muscle to a bone that is near the base of the thumb. The EPB muscle helps to straighten and extend the thumb. De Quervain tenosynovitis is a condition in which the EPB tendon lining (sheath) becomes irritated, thickened, and swollen. This condition is sometimes called stenosing tenosynovitis. This condition causes pain on the thumb side of the back of the wrist. °CAUSES °Causes of this condition include: °· Activities that repeatedly cause your thumb and wrist to extend. °· A sudden increase in activity or change in activity that affects your wrist. °RISK FACTORS: °This condition is more likely to develop in: °· Females. °· People who have diabetes. °· Women who have recently given birth. °· People who are over 40 years of age. °· People who do activities that involve repeated hand and wrist motions, such as tennis, racquetball, volleyball, gardening, and taking care of children. °· People who do heavy labor. °· People who have poor wrist strength and flexibility. °· People who do not warm up properly before activities. °SYMPTOMS °Symptoms of this condition include: °· Pain or tenderness over the thumb side of the back of the wrist when your thumb and wrist are not moving. °· Pain that gets worse when you straighten your thumb or extend your thumb or wrist. °· Pain when the injured area is touched. °· Locking or catching of the thumb joint while you bend and straighten your thumb. °· Decreased thumb motion due to pain. °· Swelling over the affected area. °DIAGNOSIS °This condition is diagnosed with a medical history and physical exam. Your health care provider will ask for details about your injury and ask about your symptoms. °TREATMENT °Treatment may include the use of icing and medicines to  reduce pain and swelling. You may also be advised to wear a splint or brace to limit your thumb and wrist motion. In less severe cases, treatment may also include working with a physical therapist to strengthen your wrist and calm the irritation around your EPB tendon sheath. In severe cases, surgery may be needed. °HOME CARE INSTRUCTIONS °If You Have a Splint or Brace: °· Wear it as told by your health care provider. Remove it only as told by your health care provider. °· Loosen the splint or brace if your fingers become numb and tingle, or if they turn cold and blue. °· Keep the splint or brace clean and dry. °Managing Pain, Stiffness, and Swelling  °· If directed, apply ice to the injured area. °¨ Put ice in a plastic bag. °¨ Place a towel between your skin and the bag. °¨ Leave the ice on for 20 minutes, 2-3 times per day. °· Move your fingers often to avoid stiffness and to lessen swelling. °· Raise (elevate) the injured area above the level of your heart while you are sitting or lying down. °General Instructions °· Return to your normal activities as told by your health care provider. Ask your health care provider what activities are safe for you. °· Take over-the-counter and prescription medicines only as told by your health care provider. °· Keep all follow-up visits as told by your health care provider. This is important. °· Do not drive or operate heavy machinery while taking prescription pain medicine. °SEEK MEDICAL CARE IF: °· Your pain, tenderness, or swelling gets worse, even if you have had   treatment. °· You have numbness or tingling in your wrist, hand, or fingers on the injured side. °  °This information is not intended to replace advice given to you by your health care provider. Make sure you discuss any questions you have with your health care provider. °  °Document Released: 01/07/2005 Document Revised: 09/28/2014 Document Reviewed: 03/15/2014 °Elsevier Interactive Patient Education ©2016  Elsevier Inc. ° °

## 2014-12-19 ENCOUNTER — Encounter: Payer: Self-pay | Admitting: Gastroenterology

## 2015-01-10 ENCOUNTER — Other Ambulatory Visit: Payer: Self-pay

## 2015-01-24 ENCOUNTER — Ambulatory Visit: Payer: Self-pay | Admitting: Internal Medicine

## 2015-03-01 ENCOUNTER — Other Ambulatory Visit: Payer: Self-pay

## 2015-03-07 ENCOUNTER — Ambulatory Visit: Payer: Self-pay | Admitting: Internal Medicine

## 2015-03-09 ENCOUNTER — Telehealth: Payer: Self-pay | Admitting: *Deleted

## 2015-03-09 ENCOUNTER — Ambulatory Visit: Payer: Self-pay | Admitting: Internal Medicine

## 2015-03-09 NOTE — Telephone Encounter (Signed)
Phone not in service.

## 2015-05-02 ENCOUNTER — Other Ambulatory Visit: Payer: Self-pay | Admitting: *Deleted

## 2015-05-02 DIAGNOSIS — B20 Human immunodeficiency virus [HIV] disease: Secondary | ICD-10-CM

## 2015-05-02 DIAGNOSIS — I10 Essential (primary) hypertension: Secondary | ICD-10-CM

## 2015-05-02 MED ORDER — ELVITEG-COBIC-EMTRICIT-TENOFAF 150-150-200-10 MG PO TABS: 1.0000 | ORAL_TABLET | Freq: Every day | ORAL | Status: DC

## 2015-05-02 MED ORDER — HYDROCHLOROTHIAZIDE 25 MG PO TABS: 25.0000 mg | ORAL_TABLET | Freq: Every day | ORAL | Status: DC

## 2015-05-02 MED ORDER — LISINOPRIL 20 MG PO TABS: 20.0000 mg | ORAL_TABLET | Freq: Every day | ORAL | Status: DC

## 2015-05-19 ENCOUNTER — Encounter (HOSPITAL_COMMUNITY): Payer: Self-pay

## 2015-05-19 ENCOUNTER — Emergency Department (HOSPITAL_COMMUNITY)
Admission: EM | Admit: 2015-05-19 | Discharge: 2015-05-20 | Disposition: A | Discharge status: Home / Self Care | Payer: Self-pay | Attending: Emergency Medicine | Admitting: Emergency Medicine

## 2015-05-19 DIAGNOSIS — F17210 Nicotine dependence, cigarettes, uncomplicated: Secondary | ICD-10-CM | POA: Insufficient documentation

## 2015-05-19 DIAGNOSIS — M62831 Muscle spasm of calf: Secondary | ICD-10-CM | POA: Insufficient documentation

## 2015-05-19 DIAGNOSIS — Z79899 Other long term (current) drug therapy: Secondary | ICD-10-CM | POA: Insufficient documentation

## 2015-05-19 DIAGNOSIS — B20 Human immunodeficiency virus [HIV] disease: Secondary | ICD-10-CM | POA: Insufficient documentation

## 2015-05-19 DIAGNOSIS — Z87440 Personal history of urinary (tract) infections: Secondary | ICD-10-CM | POA: Insufficient documentation

## 2015-05-19 DIAGNOSIS — J45909 Unspecified asthma, uncomplicated: Secondary | ICD-10-CM | POA: Insufficient documentation

## 2015-05-19 DIAGNOSIS — Z90710 Acquired absence of both cervix and uterus: Secondary | ICD-10-CM | POA: Insufficient documentation

## 2015-05-19 DIAGNOSIS — Z8611 Personal history of tuberculosis: Secondary | ICD-10-CM | POA: Insufficient documentation

## 2015-05-19 DIAGNOSIS — I10 Essential (primary) hypertension: Secondary | ICD-10-CM | POA: Insufficient documentation

## 2015-05-19 DIAGNOSIS — Z792 Long term (current) use of antibiotics: Secondary | ICD-10-CM | POA: Insufficient documentation

## 2015-05-19 DIAGNOSIS — Z8719 Personal history of other diseases of the digestive system: Secondary | ICD-10-CM | POA: Insufficient documentation

## 2015-05-19 DIAGNOSIS — F319 Bipolar disorder, unspecified: Secondary | ICD-10-CM | POA: Insufficient documentation

## 2015-05-19 DIAGNOSIS — M545 Low back pain, unspecified: Secondary | ICD-10-CM

## 2015-05-19 DIAGNOSIS — Z791 Long term (current) use of non-steroidal anti-inflammatories (NSAID): Secondary | ICD-10-CM | POA: Insufficient documentation

## 2015-05-19 MED ORDER — OXYCODONE-ACETAMINOPHEN 5-325 MG PO TABS
1.0000 | ORAL_TABLET | Freq: Once | ORAL | Status: AC
  Administered 2015-05-20: 1 via ORAL
  Filled 2015-05-19: qty 1

## 2015-05-19 MED ORDER — KETOROLAC TROMETHAMINE 60 MG/2ML IM SOLN
60.0000 mg | Freq: Once | INTRAMUSCULAR | Status: AC
  Administered 2015-05-20: 60 mg via INTRAMUSCULAR
  Filled 2015-05-19: qty 2

## 2015-05-19 NOTE — ED Provider Notes (Signed)
CSN: [id number]     Arrival date & time 05/19/15  2259 History  By signing my name below, I, Emmanuella Mensah, attest that this documentation has been prepared under the direction and in the presence of Gilda Crease, MD. Electronically Signed: Angelene Giovanni, ED Scribe. 05/19/2015. 11:49 PM.    Chief Complaint  Patient presents with  . Flank Pain   Patient is a 55 y.o. female presenting with flank pain. The history is provided by the patient. No language interpreter was used.  Flank Pain This is a new problem. The current episode started 2 days ago. The problem occurs rarely. Pertinent negatives include no abdominal pain and no shortness of breath. Nothing aggravates the symptoms. Nothing relieves the symptoms. She has tried nothing for the symptoms.   HPI Comments: Daisy Salinas is a 55 y.o. female with a hx of HIV and HTN who presents to the Emergency Department complaining of gradually worsening constant achy non-radiating right flank pain onset 3 days ago. Pt reports that movement makes the pain worse. No alleviating factors noted. Pt has not tried any medications PTA. She denies any recent falls, injuries, or trauma. She also denies a hx of back pain. Pt denies any n/v/d, abdominal pain, or any urinary symptoms.    Past Medical History  Diagnosis Date  . HIV (human immunodeficiency virus infection) (HCC)   . Hypertension   . Asthma   . TB (pulmonary tuberculosis) 1989    was exposed and treated  . UTI (urinary tract infection) 1997  . Depression   . Bipolar 1 disorder (HCC) 2009  . GERD (gastroesophageal reflux disease)    Past Surgical History  Procedure Laterality Date  . Abdominal hysterectomy     Family History  Problem Relation Age of Onset  . Coronary artery disease Mother   . Leukemia Father    Social History  Substance Use Topics  . Smoking status: Current Every Day Smoker -- 0.50 packs/day for 40 years    Types: Cigarettes  . Smokeless tobacco:  Never Used     Comment: wanting "some pills" to help her stop  . Alcohol Use: No     Comment: not ETOH since June 5th   OB History    No data available     Review of Systems  Constitutional: Negative for fever.  Respiratory: Negative for shortness of breath.   Gastrointestinal: Negative for nausea, vomiting, abdominal pain and diarrhea.  Genitourinary: Positive for flank pain. Negative for dysuria, urgency and frequency.  All other systems reviewed and are negative.     Allergies  Review of patient's allergies indicates no known allergies.  Home Medications   Prior to Admission medications   Medication Sig Start Date End Date Taking? Authorizing Provider  diclofenac sodium (VOLTAREN) 1 % GEL Apply 2 g topically 4 (four) times daily. 12/07/14   Charm Rings, MD  divalproex (DEPAKOTE) 500 MG DR tablet Take 500 mg by mouth in the morning and 1000 mg tablets by mouth each evening    Historical Provider, MD  elvitegravir-cobicistat-emtricitabine-tenofovir (GENVOYA) 150-150-200-10 MG TABS tablet Take 1 tablet by mouth daily with breakfast. 05/02/15   Cliffton Asters, MD  hydrochlorothiazide (HYDRODIURIL) 25 MG tablet Take 1 tablet (25 mg total) by mouth daily. 05/02/15   Cliffton Asters, MD  HYDROcodone-acetaminophen (NORCO/VICODIN) 5-325 MG per tablet Take 2 tablets by mouth every 4 (four) hours as needed. 06/07/14   Elson Areas, PA-C  lisinopril (PRINIVIL,ZESTRIL) 20 MG tablet Take 1 tablet (20  mg total) by mouth daily. 05/02/15   Cliffton Asters, MD  methocarbamol (ROBAXIN) 500 MG tablet Take 1 tablet (500 mg total) by mouth 2 (two) times daily. 06/05/14   Junius Finner, PA-C  naproxen (NAPROSYN) 375 MG tablet Take 1 tablet (375 mg total) by mouth 2 (two) times daily. As needed for back pain 06/01/14   Ria Clock, PA  oxyCODONE-acetaminophen (PERCOCET/ROXICET) 5-325 MG tablet Take 1 tablet by mouth every 8 (eight) hours as needed for severe pain. 11/13/14   Doreene Eland, MD   predniSONE (DELTASONE) 10 MG tablet 6,5,4,3,2,1 taper 06/07/14   Elson Areas, PA-C  QUEtiapine Fumarate (SEROQUEL XR) 150 MG 24 hr tablet Take 150 mg in the morning and 300 mg at bedtime    Historical Provider, MD  sertraline (ZOLOFT) 50 MG tablet Take 50 mg by mouth daily.    Historical Provider, MD  silver sulfADIAZINE (SILVADENE) 1 % cream Apply 1 application topically daily. To burn on lower back. Cover with dry dressing 06/01/14   Mathis Fare Presson, PA  zolpidem (AMBIEN) 5 MG tablet Take 5 mg by mouth at bedtime as needed for sleep.    Historical Provider, MD   BP 139/90 mmHg  Pulse 78  Temp(Src) 98.9 F (37.2 C)  Resp 20  SpO2 94% Physical Exam  Constitutional: She is oriented to person, place, and time. She appears well-developed and well-nourished. No distress.  HENT:  Head: Normocephalic and atraumatic.  Right Ear: Hearing normal.  Left Ear: Hearing normal.  Nose: Nose normal.  Mouth/Throat: Oropharynx is clear and moist and mucous membranes are normal.  Eyes: Conjunctivae and EOM are normal. Pupils are equal, round, and reactive to light.  Neck: Normal range of motion. Neck supple.  Cardiovascular: Regular rhythm, S1 normal and S2 normal.  Exam reveals no gallop and no friction rub.   No murmur heard. Pulmonary/Chest: Effort normal and breath sounds normal. No respiratory distress. She exhibits no tenderness.  Abdominal: Soft. Normal appearance and bowel sounds are normal. There is no hepatosplenomegaly. There is no tenderness. There is no rebound, no guarding, no tenderness at McBurney's point and negative Murphy's sign. No hernia.  Musculoskeletal: Normal range of motion.  Right paraspinal tenderness and spasm  Neurological: She is alert and oriented to person, place, and time. She has normal strength. No cranial nerve deficit or sensory deficit. Coordination normal. GCS eye subscore is 4. GCS verbal subscore is 5. GCS motor subscore is 6.  Skin: Skin is warm, dry and  intact. No rash noted. No cyanosis.  Psychiatric: She has a normal mood and affect. Her speech is normal and behavior is normal. Thought content normal.  Nursing note and vitals reviewed.   ED Course  Procedures (including critical care time) DIAGNOSTIC STUDIES: Oxygen Saturation is 94% on RA, adequate by my interpretation.    COORDINATION OF CARE: 11:48 PM- Pt advised of plan for treatment and pt agrees. Pt will provide urine sample for UA. She will receive Toradol and Percocet.   Labs Review Labs Reviewed  URINALYSIS, ROUTINE W REFLEX MICROSCOPIC (NOT AT [facility])    Imaging Review No results found.   Gilda Crease, MD has personally reviewed and evaluated these images and lab results as part of his medical decision-making.   MDM   Final diagnoses:  None  Low back pain  Patient presents to the emergency department for evaluation of right lower back pain. Patient has not noticed any urinary symptoms and her urinalysis was  unremarkable. Symptoms are very reproducible with palpation and movements of the back. Pain is nonradicular. She does not have any neurologic deficit. No saddle anesthesia, no foot drop. Patient denied previous history of similar pains, however, she has been seen at least 4 times in the past for similar right-sided low back pain according to her medical record. Examination is not worrisome for spinal emergency, will be treated for  I personally performed the services described in this documentation, which was scribed in my presence. The recorded information has been reviewed and is accurate.    Gilda Creasehristopher J Pollina, MD 05/20/15 [phone]

## 2015-05-19 NOTE — ED Notes (Signed)
Pt from [facility], sent to triage with c/o right sided flank pain onset three days ago . Denies N/D/V. She describes the pain as constant and achy. Denies dysuria or any abnormal urinary symptoms. BP- 140/91, HR-86, RR-20, CBG-83

## 2015-05-20 LAB — URINALYSIS, ROUTINE W REFLEX MICROSCOPIC
Bilirubin Urine: NEGATIVE
Glucose, UA: NEGATIVE mg/dL
Hgb urine dipstick: NEGATIVE
KETONES UR: NEGATIVE mg/dL
Leukocytes, UA: NEGATIVE
NITRITE: NEGATIVE
PH: 7 (ref 5.0–8.0)
Protein, ur: NEGATIVE mg/dL
Specific Gravity, Urine: 1.019 (ref 1.005–1.030)

## 2015-05-20 MED ORDER — TRAMADOL HCL 50 MG PO TABS: 50.0000 mg | ORAL_TABLET | Freq: Four times a day (QID) | ORAL | Status: DC | PRN

## 2015-05-20 MED ORDER — CYCLOBENZAPRINE HCL 10 MG PO TABS: 10.0000 mg | ORAL_TABLET | Freq: Three times a day (TID) | ORAL | Status: DC | PRN

## 2015-05-20 NOTE — ED Notes (Signed)
Pt. Verbalizes understanding of d/c instructions, prescriptions. Pt. displays no s/s of distress at this time. Pt. Verbalizes no concerns at this time. VS stable. Pt.  out of the unit by wheelchair at this time.

## 2015-05-20 NOTE — Discharge Instructions (Signed)

## 2015-05-20 NOTE — ED Notes (Signed)
Pt. Reports Rt. Flank/back pain that started about 4 days ago. Pt. Denies injury. Pt. Denies urinary changes.   MD saw pt. Prior to RN assessment, see note.

## 2015-07-18 ENCOUNTER — Encounter: Payer: Self-pay | Admitting: Internal Medicine

## 2015-07-19 ENCOUNTER — Other Ambulatory Visit (INDEPENDENT_AMBULATORY_CARE_PROVIDER_SITE_OTHER): Payer: Self-pay

## 2015-07-19 DIAGNOSIS — B20 Human immunodeficiency virus [HIV] disease: Secondary | ICD-10-CM

## 2015-07-19 DIAGNOSIS — Z113 Encounter for screening for infections with a predominantly sexual mode of transmission: Secondary | ICD-10-CM

## 2015-07-19 DIAGNOSIS — Z79899 Other long term (current) drug therapy: Secondary | ICD-10-CM

## 2015-07-19 LAB — CBC
HCT: 37.8 % (ref 35.0–45.0)
HEMOGLOBIN: 12.8 g/dL (ref 11.7–15.5)
MCH: 33.2 pg — AB (ref 27.0–33.0)
MCHC: 33.9 g/dL (ref 32.0–36.0)
MCV: 97.9 fL (ref 80.0–100.0)
MPV: 11.9 fL (ref 7.5–12.5)
PLATELETS: 136 10*3/uL — AB (ref 140–400)
RBC: 3.86 MIL/uL (ref 3.80–5.10)
RDW: 14.2 % (ref 11.0–15.0)
WBC: 5.7 10*3/uL (ref 3.8–10.8)

## 2015-07-20 LAB — LIPID PANEL
CHOLESTEROL: 223 mg/dL — AB (ref 125–200)
HDL: 41 mg/dL — ABNORMAL LOW (ref >=46)
LDL Cholesterol: 139 mg/dL — ABNORMAL HIGH (ref <130)
TRIGLYCERIDES: 213 mg/dL — AB (ref <150)
Total CHOL/HDL Ratio: 5.4 Ratio — ABNORMAL HIGH (ref <=5.0)
VLDL: 43 mg/dL — AB (ref <30)

## 2015-07-20 LAB — T-HELPER CELL (CD4) - (RCID CLINIC ONLY)
CD4 T CELL ABS: 1490 /uL (ref 400–2700)
CD4 T CELL HELPER: 44 % (ref 33–55)

## 2015-07-20 LAB — COMPREHENSIVE METABOLIC PANEL
ALBUMIN: 4.1 g/dL (ref 3.6–5.1)
ALK PHOS: 61 U/L (ref 33–130)
ALT: 5 U/L — AB (ref 6–29)
AST: 19 U/L (ref 10–35)
BUN: 12 mg/dL (ref 7–25)
CHLORIDE: 106 mmol/L (ref 98–110)
CO2: 23 mmol/L (ref 20–31)
CREATININE: 0.98 mg/dL (ref 0.50–1.05)
Calcium: 9.1 mg/dL (ref 8.6–10.4)
Glucose, Bld: 113 mg/dL — ABNORMAL HIGH (ref 65–99)
Potassium: 4 mmol/L (ref 3.5–5.3)
SODIUM: 141 mmol/L (ref 135–146)
TOTAL PROTEIN: 7.5 g/dL (ref 6.1–8.1)
Total Bilirubin: 0.4 mg/dL (ref 0.2–1.2)

## 2015-07-20 LAB — HIV-1 RNA QUANT-NO REFLEX-BLD

## 2015-07-20 LAB — RPR TITER

## 2015-07-20 LAB — RPR: RPR Ser Ql: REACTIVE — AB

## 2015-07-20 LAB — FLUORESCENT TREPONEMAL AB(FTA)-IGG-BLD: FLUORESCENT TREPONEMAL ABS: REACTIVE — AB

## 2015-08-08 ENCOUNTER — Encounter: Payer: Self-pay | Admitting: Internal Medicine

## 2015-08-08 ENCOUNTER — Ambulatory Visit (HOSPITAL_COMMUNITY)
Admission: RE | Admit: 2015-08-08 | Discharge: 2015-08-08 | Disposition: A | Discharge status: Home / Self Care | Payer: Self-pay | Source: Ambulatory Visit | Attending: Internal Medicine | Admitting: Internal Medicine

## 2015-08-08 ENCOUNTER — Ambulatory Visit (INDEPENDENT_AMBULATORY_CARE_PROVIDER_SITE_OTHER): Payer: Self-pay | Admitting: Internal Medicine

## 2015-08-08 VITALS — BP 164/101 | HR 69 | Temp 98.4°F | Wt 145.8 lb

## 2015-08-08 DIAGNOSIS — M25551 Pain in right hip: Secondary | ICD-10-CM | POA: Insufficient documentation

## 2015-08-08 DIAGNOSIS — R634 Abnormal weight loss: Secondary | ICD-10-CM

## 2015-08-08 DIAGNOSIS — B20 Human immunodeficiency virus [HIV] disease: Secondary | ICD-10-CM

## 2015-08-08 DIAGNOSIS — F329 Major depressive disorder, single episode, unspecified: Secondary | ICD-10-CM

## 2015-08-08 DIAGNOSIS — F32A Depression, unspecified: Secondary | ICD-10-CM

## 2015-08-08 MED ORDER — ENSURE PO LIQD: 237.0000 mL | Freq: Two times a day (BID) | ORAL | Status: DC

## 2015-08-08 NOTE — Assessment & Plan Note (Signed)
Despite all her difficulties her adherence remains excellent and her HIV infection is under good control. She will continue Genvoya.

## 2015-08-08 NOTE — Progress Notes (Signed)
Patient Active Problem List   Diagnosis Date Noted  . Chronic hepatitis C without hepatic coma (HCC) 09/08/2007    Priority: High  . Human immunodeficiency virus (HIV) disease (HCC) 10/29/2005    Priority: High  . Right hip pain 08/08/2015  . SUBSTANCE ABUSE, MULTIPLE 11/30/2009  . BRONCHITIS, CHRONIC 01/03/2009  . CONSTIPATION, CHRONIC 05/09/2008  . DE QUERVAIN'S TENOSYNOVITIS, LEFT WRIST 05/09/2008  . HYSTERECTOMY, HX OF 05/13/2006  . Depression 05/06/2006  . SYPHILIS, LATE, LATENT 10/29/2005  . DYSLIPIDEMIA 10/29/2005  . CIGARETTE SMOKER 10/29/2005  . HYPERTENSION 10/29/2005  . PROLAPSE, VAGINAL WALL NOS 10/29/2005  . MENORRHAGIA 10/29/2005  . INSOMNIA 10/29/2005    Patient's Medications  New Prescriptions   ENSURE (ENSURE)    Take 237 mLs by mouth 2 (two) times daily between meals.  Previous Medications   CYCLOBENZAPRINE (FLEXERIL) 10 MG TABLET    Take 1 tablet (10 mg total) by mouth 3 (three) times daily as needed for muscle spasms.   DIVALPROEX (DEPAKOTE) 500 MG DR TABLET    Take 500 mg by mouth in the morning and 1000 mg tablets by mouth each evening   ELVITEGRAVIR-COBICISTAT-EMTRICITABINE-TENOFOVIR (GENVOYA) 150-150-200-10 MG TABS TABLET    Take 1 tablet by mouth daily with breakfast.   HYDROCHLOROTHIAZIDE (HYDRODIURIL) 25 MG TABLET    Take 1 tablet (25 mg total) by mouth daily.   LISINOPRIL (PRINIVIL,ZESTRIL) 20 MG TABLET    Take 1 tablet (20 mg total) by mouth daily.   QUETIAPINE FUMARATE (SEROQUEL XR) 150 MG 24 HR TABLET    Take 150 mg in the morning and 300 mg at bedtime   SERTRALINE (ZOLOFT) 50 MG TABLET    Take 50 mg by mouth daily.   TRAMADOL (ULTRAM) 50 MG TABLET    Take 1 tablet (50 mg total) by mouth every 6 (six) hours as needed.   ZOLPIDEM (AMBIEN) 5 MG TABLET    Take 5 mg by mouth at bedtime.   Modified Medications   No medications on file  Discontinued Medications   No medications on file    Subjective: Daisy Salinas is in for her routine  follow-up visit. She denies missing any doses of her Genvoya since her last visit. She is taking it correctly. Unfortunately she relapsed several months ago and is using cocaine about 3 times each week. She is more depressed. She states she has no appetite and she has been losing weight. She continues to smoke cigarettes. She continues to have right hip pain.   Review of Systems: Review of Systems  Constitutional: Positive for weight loss and malaise/fatigue. Negative for fever, chills and diaphoresis.  HENT: Negative for sore throat.   Respiratory: Negative for cough, sputum production and shortness of breath.   Cardiovascular: Negative for chest pain.  Gastrointestinal: Negative for nausea, vomiting and diarrhea.  Genitourinary: Negative for dysuria and frequency.  Musculoskeletal: Positive for joint pain. Negative for myalgias.  Skin: Negative for rash.  Neurological: Negative for dizziness and headaches.  Psychiatric/Behavioral: Positive for depression and substance abuse. Negative for suicidal ideas. The patient is not nervous/anxious.     Past Medical History  Diagnosis Date  . HIV (human immunodeficiency virus infection) (HCC)   . Hypertension   . Asthma   . TB (pulmonary tuberculosis) 1989    was exposed and treated  . UTI (urinary tract infection) 1997  . Depression   . Bipolar 1 disorder (HCC) 2009  . GERD (gastroesophageal reflux disease)  Social History  Substance Use Topics  . Smoking status: Current Every Day Smoker -- 0.50 packs/day for 40 years    Types: Cigarettes  . Smokeless tobacco: Never Used     Comment: wanting "some pills" to help her stop  . Alcohol Use: 38.4 oz/week    64 Standard drinks or equivalent per week    Family History  Problem Relation Age of Onset  . Coronary artery disease Mother   . Leukemia Father     No Known Allergies  Objective:  Filed Vitals:   08/08/15 1454  BP: 164/101  Pulse: 69  Temp: 98.4 F (36.9 C)  TempSrc:  Oral  Weight: 145 lb 12 oz (66.112 kg)   Body mass index is 29.42 kg/(m^2).  Physical Exam  Constitutional: She is oriented to person, place, and time.  She is tearful.  HENT:  Mouth/Throat: No oropharyngeal exudate.  Eyes: Conjunctivae are normal.  Cardiovascular: Normal rate and regular rhythm.   No murmur heard. Pulmonary/Chest: Breath sounds normal.  Abdominal: Soft. She exhibits no mass. There is no tenderness.  Musculoskeletal: Normal range of motion. She exhibits no edema or tenderness.  Neurological: She is alert and oriented to person, place, and time. Gait normal.  Skin: No rash noted.  Psychiatric:  Flat affect.    Lab Results Lab Results  Component Value Date   WBC 5.7 07/19/2015   HGB 12.8 07/19/2015   HCT 37.8 07/19/2015   MCV 97.9 07/19/2015   PLT 136* 07/19/2015    Lab Results  Component Value Date   CREATININE 0.98 07/19/2015   BUN 12 07/19/2015   NA 141 07/19/2015   K 4.0 07/19/2015   CL 106 07/19/2015   CO2 23 07/19/2015    Lab Results  Component Value Date   ALT 5* 07/19/2015   AST 19 07/19/2015   ALKPHOS 61 07/19/2015   BILITOT 0.4 07/19/2015    Lab Results  Component Value Date   CHOL 223* 07/19/2015   HDL 41* 07/19/2015   LDLCALC 139* 07/19/2015   TRIG 213* 07/19/2015   CHOLHDL 5.4* 07/19/2015   HIV 1 RNA QUANT (copies/mL)  Date Value  07/19/2015 <20  05/16/2014 <20  12/27/2013 3974*   CD4 T CELL ABS (/uL)  Date Value  07/19/2015 1490  05/16/2014 1380  12/27/2013 1210     Problem List Items Addressed This Visit      High   Human immunodeficiency virus (HIV) disease (HCC)    Despite all her difficulties her adherence remains excellent and her HIV infection is under good control. She will continue Genvoya.        Unprioritized   Depression    She has had another relapse of her cocaine use and depression. We have scheduled her to see Jenel Lucks, or behavioral health counselor, on 08/14/2015.      Right hip pain -  Primary    She may have degenerative disease or avascular necrosis of her right hip. I will check plain x-rays and see her back in 4-6 weeks.      Relevant Orders   DG HIP UNILAT WITH PELVIS 2-3 VIEWS RIGHT    Other Visit Diagnoses    Loss of weight        Relevant Medications    ENSURE (ENSURE)         Cliffton Asters, MD [facility] [phone] pager   [phone] cell 08/08/2015, 5:39 PM

## 2015-08-08 NOTE — Assessment & Plan Note (Signed)
She has had another relapse of her cocaine use and depression. We have scheduled her to see Jenel LucksJodi Herring, or behavioral health counselor, on 08/14/2015.

## 2015-08-08 NOTE — Assessment & Plan Note (Signed)
She may have degenerative disease or avascular necrosis of her right hip. I will check plain x-rays and see her back in 4-6 weeks.

## 2015-08-14 ENCOUNTER — Ambulatory Visit: Payer: Self-pay | Admitting: *Deleted

## 2015-08-29 ENCOUNTER — Ambulatory Visit (INDEPENDENT_AMBULATORY_CARE_PROVIDER_SITE_OTHER): Payer: Self-pay | Admitting: Internal Medicine

## 2015-08-29 ENCOUNTER — Encounter: Payer: Self-pay | Admitting: Internal Medicine

## 2015-08-29 DIAGNOSIS — M25551 Pain in right hip: Secondary | ICD-10-CM

## 2015-08-29 DIAGNOSIS — F32A Depression, unspecified: Secondary | ICD-10-CM

## 2015-08-29 DIAGNOSIS — F329 Major depressive disorder, single episode, unspecified: Secondary | ICD-10-CM

## 2015-08-29 DIAGNOSIS — B20 Human immunodeficiency virus [HIV] disease: Secondary | ICD-10-CM

## 2015-08-29 NOTE — Assessment & Plan Note (Signed)
Her infection remains under excellent control. 

## 2015-08-29 NOTE — Assessment & Plan Note (Signed)
She has some mild degenerative arthritis. She can continue to take NSAIDs as necessary.

## 2015-08-29 NOTE — Assessment & Plan Note (Signed)
She remains depressed. Although she feels otherwise.believe her ongoing problems with cocaine use exacerbate the problem. I discussed this with her. I asked her to talk with [facility] about the possibility of changing her depression medications.

## 2015-08-29 NOTE — Progress Notes (Signed)
Patient Active Problem List   Diagnosis Date Noted  . Chronic hepatitis C without hepatic coma (HCC) 09/08/2007    Priority: High  . Human immunodeficiency virus (HIV) disease (HCC) 10/29/2005    Priority: High  . Right hip pain 08/08/2015  . SUBSTANCE ABUSE, MULTIPLE 11/30/2009  . BRONCHITIS, CHRONIC 01/03/2009  . CONSTIPATION, CHRONIC 05/09/2008  . DE QUERVAIN'S TENOSYNOVITIS, LEFT WRIST 05/09/2008  . HYSTERECTOMY, HX OF 05/13/2006  . Depression 05/06/2006  . SYPHILIS, LATE, LATENT 10/29/2005  . DYSLIPIDEMIA 10/29/2005  . CIGARETTE SMOKER 10/29/2005  . HYPERTENSION 10/29/2005  . PROLAPSE, VAGINAL WALL NOS 10/29/2005  . MENORRHAGIA 10/29/2005  . INSOMNIA 10/29/2005    Patient's Medications  New Prescriptions   No medications on file  Previous Medications   CYCLOBENZAPRINE (FLEXERIL) 10 MG TABLET    Take 1 tablet (10 mg total) by mouth 3 (three) times daily as needed for muscle spasms.   DIVALPROEX (DEPAKOTE) 500 MG DR TABLET    Take 500 mg by mouth in the morning and 1000 mg tablets by mouth each evening   ELVITEGRAVIR-COBICISTAT-EMTRICITABINE-TENOFOVIR (GENVOYA) 150-150-200-10 MG TABS TABLET    Take 1 tablet by mouth daily with breakfast.   ENSURE (ENSURE)    Take 237 mLs by mouth 2 (two) times daily between meals.   HYDROCHLOROTHIAZIDE (HYDRODIURIL) 25 MG TABLET    Take 1 tablet (25 mg total) by mouth daily.   LISINOPRIL (PRINIVIL,ZESTRIL) 20 MG TABLET    Take 1 tablet (20 mg total) by mouth daily.   QUETIAPINE FUMARATE (SEROQUEL XR) 150 MG 24 HR TABLET    Take 150 mg in the morning and 300 mg at bedtime   SERTRALINE (ZOLOFT) 50 MG TABLET    Take 50 mg by mouth daily.   TRAMADOL (ULTRAM) 50 MG TABLET    Take 1 tablet (50 mg total) by mouth every 6 (six) hours as needed.   ZOLPIDEM (AMBIEN) 5 MG TABLET    Take 5 mg by mouth at bedtime.   Modified Medications   No medications on file  Discontinued Medications   No medications on file    Subjective: [name]  is in for her routine HIV follow-up visit. She continues to have right hip pain that is unchanged. It is worse when she is up walking. She gets some relief from taking Aleve.  She denies missing any doses of her Genvoya. She remains depressed. She states that she has intermittent crying spells. She continues to go to [facility] twice monthly. She tells me that they have not discussed changing any of her medications. She did use cocaine again last week. She tells me that when she is using cocaine she does not feel depressed. When she is not using she gets overwhelmed.  Review of Systems: Review of Systems  Constitutional: Positive for malaise/fatigue. Negative for chills, diaphoresis, fever and weight loss.  HENT: Negative for sore throat.   Respiratory: Negative for cough, sputum production and shortness of breath.   Cardiovascular: Negative for chest pain.  Gastrointestinal: Negative for abdominal pain, diarrhea, nausea and vomiting.  Genitourinary: Negative for dysuria and frequency.  Musculoskeletal: Positive for joint pain. Negative for myalgias.  Skin: Negative for rash.  Neurological: Negative for dizziness and headaches.  Psychiatric/Behavioral: Positive for depression and substance abuse. The patient is not nervous/anxious.     Past Medical History:  Diagnosis Date  . Asthma   . Bipolar 1 disorder (HCC) 2009  . Depression   . GERD (  gastroesophageal reflux disease)   . HIV (human immunodeficiency virus infection) (HCC)   . Hypertension   . TB (pulmonary tuberculosis) 1989   was exposed and treated  . UTI (urinary tract infection) 1997    Social History  Substance Use Topics  . Smoking status: Current Every Day Smoker    Packs/day: 0.50    Years: 40.00    Types: Cigarettes  . Smokeless tobacco: Never Used     Comment: wanting "some pills" to help her stop  . Alcohol use 38.4 oz/week    64 Standard drinks or equivalent per week    Family History  Problem Relation Age of  Onset  . Coronary artery disease Mother   . Leukemia Father     No Known Allergies  Objective:  Vitals:   08/29/15 1337  BP: 135/85  Pulse: 67  Temp: 97.6 F (36.4 C)  TempSrc: Oral  Weight: 157 lb (71.2 kg)   Body mass index is 31.71 kg/m.  Physical Exam  Constitutional: She is oriented to person, place, and time.  HENT:  Mouth/Throat: No oropharyngeal exudate.  Eyes: Conjunctivae are normal.  Cardiovascular: Normal rate and regular rhythm.   No murmur heard. Pulmonary/Chest: Effort normal and breath sounds normal.  Abdominal: Soft. She exhibits no mass. There is no tenderness.  Musculoskeletal: Normal range of motion. She exhibits edema and tenderness.  She has some tenderness over her right lateral hip with firm palpation.  Neurological: She is alert and oriented to person, place, and time. Gait normal.  Skin: No rash noted.  Psychiatric:  She is tearful during the exam.    Lab Results Lab Results  Component Value Date   WBC 5.7 07/19/2015   HGB 12.8 07/19/2015   HCT 37.8 07/19/2015   MCV 97.9 07/19/2015   PLT 136 (L) 07/19/2015    Lab Results  Component Value Date   CREATININE 0.98 07/19/2015   BUN 12 07/19/2015   NA 141 07/19/2015   K 4.0 07/19/2015   CL 106 07/19/2015   CO2 23 07/19/2015    Lab Results  Component Value Date   ALT 5 (L) 07/19/2015   AST 19 07/19/2015   ALKPHOS 61 07/19/2015   BILITOT 0.4 07/19/2015    Lab Results  Component Value Date   CHOL 223 (H) 07/19/2015   HDL 41 (L) 07/19/2015   LDLCALC 139 (H) 07/19/2015   TRIG 213 (H) 07/19/2015   CHOLHDL 5.4 (H) 07/19/2015   HIV 1 RNA Quant (copies/mL)  Date Value  07/19/2015 <20  05/16/2014 <20  12/27/2013 3,974 (H)   CD4 T Cell Abs (/uL)  Date Value  07/19/2015 1,490  05/16/2014 1,380  12/27/2013 1,210   Right hip x-ray 08/08/2015 FINDINGS: Degenerative changes lumbar spine and both hips. No acute bony or joint abnormality identified. Pelvic calcifications  consistent phleboliths.  IMPRESSION: No acute abnormality.   Electronically Signed   By: Maisie Fus  Register   On: 08/09/2015 07:58   Problem List Items Addressed This Visit      High   Human immunodeficiency virus (HIV) disease (HCC)    Her infection remains under excellent control.      Relevant Orders   T-helper cell (CD4)- ([facility] clinic only)   HIV 1 RNA quant-no reflex-bld   CBC   Comprehensive metabolic panel   Lipid panel   RPR     Unprioritized   Depression    She remains depressed. Although she feels otherwise.believe her ongoing problems with cocaine use exacerbate the  problem. I discussed this with her. I asked her to talk with her counselor at [facility] about the possibility of changing her depression medications.      Right hip pain    She has some mild degenerative arthritis. She can continue to take NSAIDs as necessary.       Other Visit Diagnoses   None.       Cliffton Asters, MD [facility] [phone] pager   [phone] cell 08/29/2015, 2:12 PM

## 2015-10-11 ENCOUNTER — Encounter: Payer: Self-pay | Admitting: Internal Medicine

## 2015-10-12 ENCOUNTER — Encounter (INDEPENDENT_AMBULATORY_CARE_PROVIDER_SITE_OTHER): Payer: Self-pay | Admitting: *Deleted

## 2015-10-12 ENCOUNTER — Encounter: Payer: Self-pay | Admitting: *Deleted

## 2015-10-12 VITALS — BP 100/67 | HR 65 | Temp 98.3°F | Resp 18 | Ht 59.6 in | Wt 151.2 lb

## 2015-10-12 DIAGNOSIS — Z006 Encounter for examination for normal comparison and control in clinical research program: Secondary | ICD-10-CM

## 2015-10-12 LAB — CBC WITH DIFFERENTIAL/PLATELET
Basophils Absolute: 0 cells/uL (ref 0–200)
Basophils Relative: 0 %
EOS PCT: 1 %
Eosinophils Absolute: 66 cells/uL (ref 15–500)
HCT: 38.1 % (ref 35.0–45.0)
Hemoglobin: 13.2 g/dL (ref 11.7–15.5)
LYMPHS ABS: 3498 {cells}/uL (ref 850–3900)
LYMPHS PCT: 53 %
MCH: 34 pg — ABNORMAL HIGH (ref 27.0–33.0)
MCHC: 34.6 g/dL (ref 32.0–36.0)
MCV: 98.2 fL (ref 80.0–100.0)
MONOS PCT: 7 %
MPV: 12.2 fL (ref 7.5–12.5)
Monocytes Absolute: 462 cells/uL (ref 200–950)
NEUTROS PCT: 39 %
Neutro Abs: 2574 cells/uL (ref 1500–7800)
PLATELETS: 105 10*3/uL — AB (ref 140–400)
RBC: 3.88 MIL/uL (ref 3.80–5.10)
RDW: 14.9 % (ref 11.0–15.0)
WBC: 6.6 10*3/uL (ref 3.8–10.8)

## 2015-10-12 LAB — COMPREHENSIVE METABOLIC PANEL
ALT: 5 U/L — ABNORMAL LOW (ref 6–29)
AST: 25 U/L (ref 10–35)
Albumin: 4.5 g/dL (ref 3.6–5.1)
Alkaline Phosphatase: 50 U/L (ref 33–130)
BILIRUBIN TOTAL: 0.5 mg/dL (ref 0.2–1.2)
BUN: 12 mg/dL (ref 7–25)
CO2: 24 mmol/L (ref 20–31)
CREATININE: 0.94 mg/dL (ref 0.50–1.05)
Calcium: 9.7 mg/dL (ref 8.6–10.4)
Chloride: 102 mmol/L (ref 98–110)
GLUCOSE: 94 mg/dL (ref 65–99)
Potassium: 3.4 mmol/L — ABNORMAL LOW (ref 3.5–5.3)
SODIUM: 138 mmol/L (ref 135–146)
Total Protein: 8.3 g/dL — ABNORMAL HIGH (ref 6.1–8.1)

## 2015-10-12 LAB — LIPID PANEL
Cholesterol: 221 mg/dL — ABNORMAL HIGH (ref 125–200)
HDL: 37 mg/dL — ABNORMAL LOW (ref >=46)
LDL CALC: 145 mg/dL — AB (ref <130)
Total CHOL/HDL Ratio: 6 Ratio — ABNORMAL HIGH (ref <=5.0)
Triglycerides: 194 mg/dL — ABNORMAL HIGH (ref <150)
VLDL: 39 mg/dL — ABNORMAL HIGH (ref <30)

## 2015-10-12 NOTE — Progress Notes (Signed)
Daisy Salinas came in today to screen for Reprieve, A Randomized Trial to Prevent Vascular Events in HIV (study drug is Pitavastatin 4mg  or placebo). We went over the consent together and I had heard verbalize back to me her understanding of it. I answered all her question regarding the study before she signed the consent. She said that another patient had told her about it.  She said she was diagnosed with HIV in 1989 but never started treatment until 2001 with Atripla. She was switched to [place] this year. She has a history of Hep C that was treated  And also TB that was treated. She currently uses cocaine about twice a week but doesn't feel that it interferes with her functioning. She is on treatment for depression and has been for a long time. Also, has a history of hypertension and hyperlipidemia,. She had a hysterectomy in 2008 and both ovaries were removed. If her risk score is not too high for the study, we will plan on enrolling her in a couple weeks.

## 2015-10-12 NOTE — Progress Notes (Signed)
   Subjective:    Patient ID: Daisy Salinas, female    DOB: 12-03-1960, 55 y.o.   MRN: [id number]  HPI    Review of Systems  Constitutional: Negative.   HENT: Negative.   Respiratory: Negative.   Cardiovascular: Negative.   Gastrointestinal: Negative.   Genitourinary: Negative.   Musculoskeletal: Negative.   Neurological: Negative.   Psychiatric/Behavioral: Negative.        Objective:   Physical Exam  Constitutional: She is oriented to person, place, and time.  HENT:  Mouth/Throat: Oropharynx is clear and moist.  Eyes: No scleral icterus.  Neck: Normal range of motion.  Cardiovascular: Normal rate, regular rhythm, normal heart sounds and intact distal pulses.   Pulmonary/Chest: Effort normal and breath sounds normal.  Abdominal: Soft. Bowel sounds are normal.  Musculoskeletal: Normal range of motion. She exhibits no edema.  Lymphadenopathy:    She has no cervical adenopathy.  Neurological: She is alert and oriented to person, place, and time.  Skin: Skin is warm and dry.  Psychiatric: She has a normal mood and affect.          Assessment & Plan:

## 2015-11-01 ENCOUNTER — Encounter (INDEPENDENT_AMBULATORY_CARE_PROVIDER_SITE_OTHER): Payer: Self-pay | Admitting: *Deleted

## 2015-11-01 VITALS — BP 121/79 | HR 63 | Temp 98.1°F | Wt 151.8 lb

## 2015-11-01 DIAGNOSIS — Z006 Encounter for examination for normal comparison and control in clinical research program: Secondary | ICD-10-CM

## 2015-11-01 NOTE — Progress Notes (Signed)
Daisy Salinas is here today to enroll in the Reprieve, A Randomized Trial to Prevent Vascular Events in HIV (study drug is Pitavastatin 4mg  or placebo). After eligibility was confirmed, she was randomized to receive pitavastatin 4mg  or placebo to take daily. She currently denies any other problems or concerns,. We reviewed the side effects of the medication and when she needs to call for problems, such as severe muscle aches, rash, etc. She plans to start today and take it with her other medications. She will return in 1 month for foloowup

## 2015-11-30 ENCOUNTER — Encounter (INDEPENDENT_AMBULATORY_CARE_PROVIDER_SITE_OTHER): Payer: Self-pay | Admitting: *Deleted

## 2015-11-30 VITALS — BP 127/85 | HR 58 | Temp 98.0°F | Wt 149.0 lb

## 2015-11-30 DIAGNOSIS — Z006 Encounter for examination for normal comparison and control in clinical research program: Secondary | ICD-10-CM

## 2015-11-30 LAB — COMPREHENSIVE METABOLIC PANEL
ALK PHOS: 48 U/L (ref 33–130)
ALT: 5 U/L — AB (ref 6–29)
AST: 22 U/L (ref 10–35)
Albumin: 4.1 g/dL (ref 3.6–5.1)
BUN: 15 mg/dL (ref 7–25)
CO2: 24 mmol/L (ref 20–31)
Calcium: 9.3 mg/dL (ref 8.6–10.4)
Chloride: 108 mmol/L (ref 98–110)
Creat: 1 mg/dL (ref 0.50–1.05)
Glucose, Bld: 92 mg/dL (ref 65–99)
Potassium: 3.6 mmol/L (ref 3.5–5.3)
SODIUM: 141 mmol/L (ref 135–146)
TOTAL PROTEIN: 7.8 g/dL (ref 6.1–8.1)
Total Bilirubin: 0.4 mg/dL (ref 0.2–1.2)

## 2015-11-30 NOTE — Progress Notes (Signed)
Daisy Salinas here for month 1 visit [phone], A Randomized Trial to Prevent Vascular Events in HIV (The REPRIEVE Study). Denies any muscle ache or weakness. Verbalized excellent adherence with study medication. No missed doses since last visit. No new complaints or concerns verbalized. Next visit scheduled for 02/29/16 @ 3:30pm.

## 2016-02-20 ENCOUNTER — Emergency Department (HOSPITAL_COMMUNITY): Payer: Self-pay

## 2016-02-20 ENCOUNTER — Emergency Department (HOSPITAL_COMMUNITY)
Admission: EM | Admit: 2016-02-20 | Discharge: 2016-02-20 | Disposition: A | Discharge status: Home / Self Care | Payer: Self-pay | Attending: Emergency Medicine | Admitting: Emergency Medicine

## 2016-02-20 ENCOUNTER — Encounter (HOSPITAL_COMMUNITY): Payer: Self-pay

## 2016-02-20 DIAGNOSIS — F1490 Cocaine use, unspecified, uncomplicated: Secondary | ICD-10-CM

## 2016-02-20 DIAGNOSIS — M654 Radial styloid tenosynovitis [de Quervain]: Secondary | ICD-10-CM | POA: Insufficient documentation

## 2016-02-20 DIAGNOSIS — K219 Gastro-esophageal reflux disease without esophagitis: Secondary | ICD-10-CM | POA: Insufficient documentation

## 2016-02-20 DIAGNOSIS — D649 Anemia, unspecified: Secondary | ICD-10-CM | POA: Insufficient documentation

## 2016-02-20 DIAGNOSIS — F17210 Nicotine dependence, cigarettes, uncomplicated: Secondary | ICD-10-CM | POA: Insufficient documentation

## 2016-02-20 DIAGNOSIS — K30 Functional dyspepsia: Secondary | ICD-10-CM | POA: Insufficient documentation

## 2016-02-20 DIAGNOSIS — D696 Thrombocytopenia, unspecified: Secondary | ICD-10-CM | POA: Insufficient documentation

## 2016-02-20 DIAGNOSIS — R0789 Other chest pain: Secondary | ICD-10-CM | POA: Insufficient documentation

## 2016-02-20 DIAGNOSIS — J45909 Unspecified asthma, uncomplicated: Secondary | ICD-10-CM | POA: Insufficient documentation

## 2016-02-20 DIAGNOSIS — I10 Essential (primary) hypertension: Secondary | ICD-10-CM | POA: Insufficient documentation

## 2016-02-20 DIAGNOSIS — K2970 Gastritis, unspecified, without bleeding: Secondary | ICD-10-CM | POA: Insufficient documentation

## 2016-02-20 DIAGNOSIS — Z720 Tobacco use: Secondary | ICD-10-CM

## 2016-02-20 DIAGNOSIS — Z79899 Other long term (current) drug therapy: Secondary | ICD-10-CM | POA: Insufficient documentation

## 2016-02-20 LAB — CBC WITH DIFFERENTIAL/PLATELET
Basophils Absolute: 0 10*3/uL (ref 0.0–0.1)
Basophils Relative: 0 %
EOS ABS: 0 10*3/uL (ref 0.0–0.7)
Eosinophils Relative: 1 %
HCT: 33.6 % — ABNORMAL LOW (ref 36.0–46.0)
HEMOGLOBIN: 11.4 g/dL — AB (ref 12.0–15.0)
LYMPHS ABS: 3.6 10*3/uL (ref 0.7–4.0)
Lymphocytes Relative: 60 %
MCH: 33 pg (ref 26.0–34.0)
MCHC: 33.9 g/dL (ref 30.0–36.0)
MCV: 97.4 fL (ref 78.0–100.0)
MONOS PCT: 7 %
Monocytes Absolute: 0.4 10*3/uL (ref 0.1–1.0)
NEUTROS PCT: 33 %
Neutro Abs: 2 10*3/uL (ref 1.7–7.7)
Platelets: 105 10*3/uL — ABNORMAL LOW (ref 150–400)
RBC: 3.45 MIL/uL — ABNORMAL LOW (ref 3.87–5.11)
RDW: 14.3 % (ref 11.5–15.5)
WBC: 6 10*3/uL (ref 4.0–10.5)

## 2016-02-20 LAB — COMPREHENSIVE METABOLIC PANEL
ALK PHOS: 42 U/L (ref 38–126)
ALT: 9 U/L — ABNORMAL LOW (ref 14–54)
ANION GAP: 7 (ref 5–15)
AST: 31 U/L (ref 15–41)
Albumin: 3.7 g/dL (ref 3.5–5.0)
BILIRUBIN TOTAL: 0.7 mg/dL (ref 0.3–1.2)
BUN: 10 mg/dL (ref 6–20)
CALCIUM: 9 mg/dL (ref 8.9–10.3)
CO2: 23 mmol/L (ref 22–32)
Chloride: 108 mmol/L (ref 101–111)
Creatinine, Ser: 0.74 mg/dL (ref 0.44–1.00)
Glucose, Bld: 92 mg/dL (ref 65–99)
Potassium: 3.7 mmol/L (ref 3.5–5.1)
SODIUM: 138 mmol/L (ref 135–145)
TOTAL PROTEIN: 7.1 g/dL (ref 6.5–8.1)

## 2016-02-20 LAB — URINALYSIS, ROUTINE W REFLEX MICROSCOPIC
Bilirubin Urine: NEGATIVE
GLUCOSE, UA: NEGATIVE mg/dL
HGB URINE DIPSTICK: NEGATIVE
KETONES UR: NEGATIVE mg/dL
Leukocytes, UA: NEGATIVE
Nitrite: NEGATIVE
PROTEIN: NEGATIVE mg/dL
Specific Gravity, Urine: 1.02 (ref 1.005–1.030)
pH: 6 (ref 5.0–8.0)

## 2016-02-20 LAB — LIPASE, BLOOD: LIPASE: 27 U/L (ref 11–51)

## 2016-02-20 LAB — I-STAT TROPONIN, ED
TROPONIN I, POC: 0 ng/mL (ref 0.00–0.08)
Troponin i, poc: 0.01 ng/mL (ref 0.00–0.08)

## 2016-02-20 MED ORDER — FAMOTIDINE IN NACL 20-0.9 MG/50ML-% IV SOLN
20.0000 mg | Freq: Once | INTRAVENOUS | Status: AC
  Administered 2016-02-20: 20 mg via INTRAVENOUS
  Filled 2016-02-20: qty 50

## 2016-02-20 MED ORDER — GI COCKTAIL ~~LOC~~
30.0000 mL | Freq: Once | ORAL | Status: AC
  Administered 2016-02-20: 30 mL via ORAL
  Filled 2016-02-20: qty 30

## 2016-02-20 MED ORDER — RANITIDINE HCL 150 MG PO TABS: 150.0000 mg | ORAL_TABLET | Freq: Two times a day (BID) | ORAL | 0 refills | Status: DC

## 2016-02-20 NOTE — Discharge Instructions (Signed)
Your chest pain is likely from indigestion, or could be also related to gastritis or an ulcer. You will need to take zantac as directed, and avoid spicy/fatty/acidic foods, avoid soda/coffee/tea/alcohol. Avoid laying down flat within 30 minutes of eating. Avoid NSAIDs like ibuprofen/aleve/motrin/etc on an empty stomach. May consider using over the counter tums/maalox as needed for additional relief. Use tylenol as needed for additional pain relief. STOP SMOKING! STOP USING COCAINE! Follow up with the your regular doctor in one week for recheck and ongoing evaluation of your symptoms. Return to the ER for changes or worsening symptoms.   For your wrist, use the splint as needed for comfort and rest; use ice or heat to the wrist for no more than 20 minutes per hour, as needed for pain, and follow up with your primary care doctor for recheck of symptoms and ongoing management of your wrist pain; may also consider following up with the hand specialist in 1-2 weeks for ongoing management of your wrist pain, and perhaps another injection.

## 2016-02-20 NOTE — Progress Notes (Signed)
Orthopedic Tech Progress Note Patient Details:  Daisy Salinas 02/04/1960 [id number]  Ortho Devices Type of Ortho Device: Thumb velcro splint Ortho Device/Splint Location: Applied Thumb Velcro Splint to Right Hand.  Pt tolerated well.  (Right Hand) Ortho Device/Splint Interventions: Application, Adjustment   Alvina ChouWilliams, Jeremie Giangrande C 02/20/2016, 12:44 PM

## 2016-02-20 NOTE — ED Notes (Signed)
PT did not have to use RR at this time  

## 2016-02-20 NOTE — ED Notes (Signed)
Ortho applied splint 

## 2016-02-20 NOTE — ED Triage Notes (Signed)
Pt brought in by EMS due to having chest pain that started at work this am. Pt describes pain as sharp and burning. Pain relieved by eating 4 packets of mustard per pt. Pt received 324mg  of aspirin enroute. Pt has hx of HTN.

## 2016-02-20 NOTE — ED Provider Notes (Signed)
[facility] Provider Note   CSN: [id number] Arrival date & time: 02/20/16  1039     History   Chief Complaint Chief Complaint  Patient presents with  . Chest Pain    HPI Daisy Salinas is a 56 y.o. female with a PMHx of HIV, remote TB (treated), GERD, HTN, HLD, depression, bipolar 1 disorder, asthma, polysubstance abuse, deQuervain's tenosynovitis, chronic constipation, and chronic Hep C, with a PSHx of abd hysterectomy, who presents to the ED with complaints of gradual onset central chest pain that began around 9:45 AM while she was at work. Patient states that she thinks she is having indigestion. 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 She was also given aspirin 324 mg in route but states that her chest pain had artery resolved before she was given that. She states that currently she has no ongoing symptoms. She reports that 3 days ago she had a very similar episode of indigestion and central CP, treated it with mustard and it resolved. She also mentions that she has had a dry cough for 5 days. She also mentions that her R wrist has been acting up, she's had dequervain's tenosynovitis in the wrist in the past, was treated with injection on 11/2014 at [facility] and given thumb spica splint; it's been acting up recently; however she has never followed up with ortho or her PCP. She has since lost the splint she had. +Smoker. Admits to cocaine use, last use was 4 days ago, denies any use today. Denies having any food or PO intake this morning. Denies suspicious food intake, sick contacts, EtOH use, or chronic NSAID use. Only abd surgery is hysterectomy. +FHx of cardiac disease in mother, she's unsure what she had but thinks it may  have been a heart murmur; denies any known stents or MIs in any family members.   She denies diaphoresis, other URI symptoms, lightheadedness, fevers, chills, SOB, LE swelling, recent travel/surg/immobilization, estrogen use, personal/family hx of DVT/PE, hemoptysis, sputum production, abd pain, N/V/D/C, obstipation, melena, hematochezia, hematuria, dysuria, myalgias, joint swelling, numbness, tingling, weakness, claudication, orthopnea, or any other complaints at this time.   Of note, she followed with Dr. Orvan Falconer for her HIV; is compliant with all HIV meds; last CD4 count was 1490 on 07/19/15. No empiric abx currently, just antiretrovirals.    The history is provided by the patient and medical records. No language interpreter was used.  Chest Pain   This is a recurrent problem. The current episode started 1 to 2 hours ago. The problem occurs every several days. The problem has been resolved. The pain is present in the substernal region. The pain is at a severity of 9/10. The pain is moderate. The quality of the pain is described as burning. The pain does not radiate. Episode Length: brief. Exacerbated by: nothing. Associated symptoms include cough (dry cough x5 days). Pertinent negatives include no abdominal pain, no claudication, no diaphoresis, no fever, no hemoptysis, no leg pain, no lower extremity edema, no nausea, no numbness, no orthopnea, no shortness of breath, no sputum production, no vomiting and no weakness. Treatments tried: 4 packets of mustard, and ASA 324mg . The treatment provided significant relief. Risk factors include substance abuse and smoking/tobacco exposure.  Her past medical history is significant for hyperlipidemia  and hypertension.  Pertinent negatives for past medical history include no diabetes, no DVT and no MI.  Her family medical history is significant for heart disease (Mother with ?murmur- unclear history).    Past Medical History:  Diagnosis Date  . Asthma   .  Bipolar 1 disorder (HCC) 2009  . Depression   . GERD (gastroesophageal reflux disease)   . HIV (human immunodeficiency virus infection) (HCC)   . Hypertension   . TB (pulmonary tuberculosis) 1989   was exposed and treated  . UTI (urinary tract infection) 1997    Patient Active Problem List   Diagnosis Date Noted  . Right hip pain 08/08/2015  . SUBSTANCE ABUSE, MULTIPLE 11/30/2009  . BRONCHITIS, CHRONIC 01/03/2009  . CONSTIPATION, CHRONIC 05/09/2008  . DE QUERVAIN'S TENOSYNOVITIS, LEFT WRIST 05/09/2008  . Chronic hepatitis C without hepatic coma (HCC) 09/08/2007  . HYSTERECTOMY, HX OF 05/13/2006  . Depression 05/06/2006  . Human immunodeficiency virus (HIV) disease (HCC) 10/29/2005  . SYPHILIS, LATE, LATENT 10/29/2005  . DYSLIPIDEMIA 10/29/2005  . CIGARETTE SMOKER 10/29/2005  . HYPERTENSION 10/29/2005  . PROLAPSE, VAGINAL WALL NOS 10/29/2005  . MENORRHAGIA 10/29/2005  . INSOMNIA 10/29/2005    Past Surgical History:  Procedure Laterality Date  . ABDOMINAL HYSTERECTOMY      OB History    No data available       Home Medications    Prior to Admission medications   Medication Sig Start Date End Date Taking? Authorizing Provider  cyclobenzaprine (FLEXERIL) 10 MG tablet Take 1 tablet (10 mg total) by mouth 3 (three) times daily as needed for muscle spasms. 05/20/15   Gilda Crease, MD  divalproex (DEPAKOTE) 500 MG DR tablet Take 500 mg by mouth in the morning and 1000 mg tablets by mouth each evening    Historical Provider, MD  elvitegravir-cobicistat-emtricitabine-tenofovir (GENVOYA) 150-150-200-10 MG TABS tablet Take 1 tablet by mouth daily with breakfast. 05/02/15   Cliffton Asters, MD  ENSURE (ENSURE) Take 237 mLs by mouth 2 (two) times daily between meals. 08/08/15   Cliffton Asters, MD  hydrochlorothiazide (HYDRODIURIL) 25 MG tablet Take 1 tablet (25 mg total) by mouth daily. 05/02/15   Cliffton Asters, MD  lisinopril (PRINIVIL,ZESTRIL) 20 MG tablet Take 1 tablet (20 mg  total) by mouth daily. 05/02/15   Cliffton Asters, MD  Pitavastatin Calcium 4 MG TABS Take 1 tablet by mouth daily. This is study provided and may be placebo. Do not fill prescription. 11/01/15   Historical Provider, MD  QUEtiapine Fumarate (SEROQUEL XR) 150 MG 24 hr tablet Take 150 mg in the morning and 300 mg at bedtime    Historical Provider, MD  sertraline (ZOLOFT) 50 MG tablet Take 50 mg by mouth daily.    Historical Provider, MD  traMADol (ULTRAM) 50 MG tablet Take 1 tablet (50 mg total) by mouth every 6 (six) hours as needed. 05/20/15   Gilda Crease, MD  zolpidem (AMBIEN) 5 MG tablet Take 5 mg by mouth at bedtime.     Historical Provider, MD    Family History Family History  Problem Relation Age of Onset  . Coronary artery disease Mother   . Leukemia Father     Social History Social History  Substance Use Topics  . Smoking status: Current Every Day Smoker    Packs/day: 0.50    Years: 40.00    Types: Cigarettes  . Smokeless tobacco: Never Used     Comment: wanting "some pills" to help her stop  . Alcohol use  38.4 oz/week    64 Standard drinks or equivalent per week     Allergies   Patient has no known allergies.   Review of Systems Review of Systems  Constitutional: Negative for chills, diaphoresis and fever.  HENT: Negative for rhinorrhea and sore throat.   Respiratory: Positive for cough (dry cough x5 days). Negative for hemoptysis, sputum production and shortness of breath.   Cardiovascular: Positive for chest pain. Negative for orthopnea, claudication and leg swelling.  Gastrointestinal: Negative for abdominal pain, blood in stool, constipation, diarrhea, nausea and vomiting.  Genitourinary: Negative for dysuria and hematuria.  Musculoskeletal: Positive for arthralgias (R wrist, chronic issue). Negative for joint swelling and myalgias.  Skin: Negative for color change.  Allergic/Immunologic: Positive for immunocompromised state (HIV).  Neurological: Negative  for weakness, light-headedness and numbness.  Psychiatric/Behavioral: Negative for confusion.   10 Systems reviewed and are negative for acute change except as noted in the HPI.   Physical Exam Updated Vital Signs BP 116/71 (BP Location: Right Arm)   Pulse (!) 54   Temp 98.5 F (36.9 C) (Oral)   Resp 17   Ht 5\' 1"  (1.549 m)   Wt 66.7 kg   SpO2 97%   BMI 27.78 kg/m   Physical Exam  Constitutional: She is oriented to person, place, and time. Vital signs are normal. She appears well-developed and well-nourished.  Non-toxic appearance. No distress.  Afebrile, nontoxic, NAD  HENT:  Head: Normocephalic and atraumatic.  Mouth/Throat: Oropharynx is clear and moist and mucous membranes are normal.  Eyes: Conjunctivae and EOM are normal. Right eye exhibits no discharge. Left eye exhibits no discharge.  Neck: Normal range of motion. Neck supple.  Cardiovascular: Normal rate, regular rhythm, normal heart sounds and intact distal pulses.  Exam reveals no gallop and no friction rub.   No murmur heard. HR 55-60 during exam, RRR, nl s1/s2, no m/r/g, distal pulses intact, no pedal edema   Pulmonary/Chest: Effort normal and breath sounds normal. No respiratory distress. She has no decreased breath sounds. She has no wheezes. She has no rhonchi. She has no rales. She exhibits tenderness. She exhibits no crepitus, no deformity and no retraction.    CTAB in all lung fields, no w/r/r, no hypoxia or increased WOB, speaking in full sentences, SpO2 97% on RA Chest wall with mild TTP near epigastric region, without crepitus, deformities, or retractions   Abdominal: Soft. Normal appearance and bowel sounds are normal. She exhibits no distension. There is tenderness in the epigastric area. There is no rigidity, no rebound, no guarding, no CVA tenderness, no tenderness at McBurney's point and negative Murphy's sign.    Soft, nondistended, +BS throughout, with mild epigastric TTP, no r/g/r, neg murphy's, neg  mcburney's, no CVA TTP   Musculoskeletal: Normal range of motion.       Right wrist: She exhibits tenderness. She exhibits normal range of motion, no bony tenderness, no swelling, no effusion, no crepitus, no deformity and no laceration.  MAE x4 Strength and sensation grossly intact in all extremities Distal pulses intact Gait steady No pedal edema, neg homan's bilaterally  R wrist with FROM intact, mild TTP over the extensor pollicis brevis and the abductor pollicis longus tendon areas; no focal bony TTP; no swelling or bruising, no crepitus or deformity, no erythema or warmth, with +finkelstein's. Grip strength preserved, sensation grossly intact, wiggles all fingers with ease.  Neurological: She is alert and oriented to person, place, and time. She has normal strength. No sensory deficit.  Skin: Skin is warm, dry and intact. No rash noted.  Psychiatric: She has a normal mood and affect.  Nursing note and vitals reviewed.    ED Treatments / Results  Labs (all labs ordered are listed, but only abnormal results are displayed) Labs Reviewed  CBC WITH DIFFERENTIAL/PLATELET - Abnormal; Notable for the following:       Result Value   RBC 3.45 (*)    Hemoglobin 11.4 (*)    HCT 33.6 (*)    Platelets 105 (*)    All other components within normal limits  COMPREHENSIVE METABOLIC PANEL - Abnormal; Notable for the following:    ALT 9 (*)    All other components within normal limits  LIPASE, BLOOD  URINALYSIS, ROUTINE W REFLEX MICROSCOPIC  I-STAT TROPOININ, ED  I-STAT TROPOININ, ED    EKG  EKG Interpretation  Date/Time:  Tuesday February 20 2016 10:44:41 EST Ventricular Rate:  54 PR Interval:    QRS Duration: 83 QT Interval:  472 QTC Calculation: 448 R Axis:   73 Text Interpretation:  Sinus rhythm Confirmed by MESNER MD, Barbara Cower [phone]) on 02/20/2016 10:49:58 AM       Radiology Dg Chest 2 View  Result Date: 02/20/2016 CLINICAL DATA:  Episode of mid to upper chest pain 2-3 days  ago which resolved and then recurred this morning with a choking sensation. Current smoker. History of asthma, TB, HIV, hepatitis-C. EXAM: CHEST  2 VIEW COMPARISON:  PA and lateral chest x-ray of February 21, 2011 FINDINGS: The lungs are well-expanded and clear. The heart and pulmonary vascularity are normal. The mediastinum is normal in width. There is no pleural effusion. The trachea is midline. The bony thorax exhibits no acute abnormality. IMPRESSION: There is no active cardiopulmonary disease. Electronically Signed   By: David  [place] M.D.   On: 02/20/2016 12:20    Procedures Procedures (including critical care time)  Medications Ordered in ED Medications  famotidine (PEPCID) IVPB 20 mg premix (0 mg Intravenous Stopped 02/20/16 1237)  gi cocktail (Maalox,Lidocaine,Donnatal) (30 mLs Oral Given 02/20/16 1127)     Initial Impression / Assessment and Plan / ED Course  I have reviewed the triage vital signs and the nursing notes.  Pertinent labs & imaging results that were available during my care of the patient were reviewed by me and considered in my medical decision making (see chart for details).     56 y.o. female here with indigestion/central CP that began at work; had similar symptoms 3 days ago that resolved with mustard; states it feels like indigestion; used mustard again today and symptoms resolved. Has also had a dry cough x5 days, no other URI symptoms. On exam, tenderness reproduced in the epigastric area, nonperitoneal abdomen, neg murphy's; no tachycardia/hypoxia/LE swelling or SOB, doubt PE/dissection. Clear lung exam. Last CD4 count 1490 on 07/19/15 so patient well managed as far as her HIV is concerned. EKG reassuring. Symptoms likely GERD/indigestion/gastritis; will get labs, CXR, U/A, and give pepcid and GI cocktail. Pt already given ASA. Doubt need for morphine or NTG. As an aside, she mentions she is having recurrent issues with her R wrist, related to deQuervain's  tenosynovitis; requesting injection; last had injection done 11/2014; discussed that she needs to f/up with PCP or ortho for this, will give thumb spica splint here since she states she lost hers. Doubt need for further emergent work up for this. Will reassess shortly  2:01 PM CBC w/diff with mild anemia slightly lower than prior values; also with chronic  stable thrombocytopenia. CMP unremarkable. Lipase WNL. U/A WNL. Trop negative. CXR unremarkable. Pt feeling better, no ongoing or recurrent CP since initial eval. Given low-risk HEART score but still with some cardiac RFs, will get second troponin at 5hr mark (3hrs from initial troponin). Will continue to monitor and reassess after second trop results.   3:36 PM Second trop neg. Pt continues to feel improved. Symptoms likely related to indigestion/GERD; will start on zantac. Discussed use of tylenol and sparing NSAID use only on full stomach and only if absolutely necessary; PRN tums/maalox for additional relief; diet and lifestyle modifications for GERD discussed. Smoking and cocaine cessation advised. F/up with PCP in 1wk for recheck of symptoms. Also, for wrist, discussed use of ice/heat, splint use, and F/up with hand specialist and/or PCP for ongoing management of her R wrist DeQuervain's tenosynovitis. I explained the diagnosis and have given explicit precautions to return to the ER including for any other new or worsening symptoms. The patient understands and accepts the medical plan as it's been dictated and I have answered their questions. Discharge instructions concerning home care and prescriptions have been given. The patient is STABLE and is discharged to home in good condition.   Final Clinical Impressions(s) / ED Diagnoses   Final diagnoses:  Atypical chest pain  Indigestion  Gastritis, presence of bleeding unspecified, unspecified chronicity, unspecified gastritis type  Gastroesophageal reflux disease, esophagitis presence not  specified  Tobacco user  Cocaine use  De Quervain's tenosynovitis, right  Anemia, unspecified type  Thrombocytopenia (HCC)    New Prescriptions New Prescriptions   RANITIDINE (ZANTAC) 150 MG TABLET    Take 1 tablet (150 mg total) by mouth 2 (two) times daily.       [address] PA-C 02/20/16 1537    Marily Memos, MD 02/20/16 [phone]

## 2016-02-20 NOTE — ED Notes (Signed)
Pt did not need anything at this time PT did not have to use RR at this time  

## 2016-02-26 ENCOUNTER — Other Ambulatory Visit (INDEPENDENT_AMBULATORY_CARE_PROVIDER_SITE_OTHER): Payer: Self-pay

## 2016-02-26 DIAGNOSIS — B20 Human immunodeficiency virus [HIV] disease: Secondary | ICD-10-CM

## 2016-02-26 DIAGNOSIS — Z79899 Other long term (current) drug therapy: Secondary | ICD-10-CM

## 2016-02-26 DIAGNOSIS — Z113 Encounter for screening for infections with a predominantly sexual mode of transmission: Secondary | ICD-10-CM

## 2016-02-26 LAB — CBC
HCT: 33.4 % — ABNORMAL LOW (ref 35.0–45.0)
HEMOGLOBIN: 11.5 g/dL — AB (ref 11.7–15.5)
MCH: 33.7 pg — ABNORMAL HIGH (ref 27.0–33.0)
MCHC: 34.4 g/dL (ref 32.0–36.0)
MCV: 97.9 fL (ref 80.0–100.0)
MPV: 11.6 fL (ref 7.5–12.5)
Platelets: 102 10*3/uL — ABNORMAL LOW (ref 140–400)
RBC: 3.41 MIL/uL — AB (ref 3.80–5.10)
RDW: 14.7 % (ref 11.0–15.0)
WBC: 5.6 10*3/uL (ref 3.8–10.8)

## 2016-02-27 ENCOUNTER — Other Ambulatory Visit: Payer: Self-pay

## 2016-02-27 LAB — COMPREHENSIVE METABOLIC PANEL
ALBUMIN: 4.2 g/dL (ref 3.6–5.1)
ALK PHOS: 43 U/L (ref 33–130)
ALT: 4 U/L — AB (ref 6–29)
AST: 19 U/L (ref 10–35)
BILIRUBIN TOTAL: 0.4 mg/dL (ref 0.2–1.2)
BUN: 15 mg/dL (ref 7–25)
CO2: 26 mmol/L (ref 20–31)
Calcium: 9.4 mg/dL (ref 8.6–10.4)
Chloride: 109 mmol/L (ref 98–110)
Creat: 0.97 mg/dL (ref 0.50–1.05)
GLUCOSE: 103 mg/dL — AB (ref 65–99)
POTASSIUM: 3.2 mmol/L — AB (ref 3.5–5.3)
Sodium: 141 mmol/L (ref 135–146)
TOTAL PROTEIN: 7.4 g/dL (ref 6.1–8.1)

## 2016-02-27 LAB — RPR

## 2016-02-27 LAB — LIPID PANEL
CHOLESTEROL: 152 mg/dL (ref <200)
HDL: 30 mg/dL — AB (ref >50)
LDL Cholesterol: 56 mg/dL (ref <100)
Total CHOL/HDL Ratio: 5.1 Ratio — ABNORMAL HIGH (ref <5.0)
Triglycerides: 331 mg/dL — ABNORMAL HIGH (ref <150)
VLDL: 66 mg/dL — ABNORMAL HIGH (ref <30)

## 2016-02-28 LAB — T-HELPER CELL (CD4) - (RCID CLINIC ONLY)
CD4 T CELL ABS: 1490 /uL (ref 400–2700)
CD4 T CELL HELPER: 42 % (ref 33–55)

## 2016-02-28 LAB — HIV-1 RNA QUANT-NO REFLEX-BLD
HIV 1 RNA Quant: <20 copies/mL — AB
HIV-1 RNA Quant, Log: <1.3 Log copies/mL — AB

## 2016-02-29 ENCOUNTER — Encounter (INDEPENDENT_AMBULATORY_CARE_PROVIDER_SITE_OTHER): Payer: Self-pay | Admitting: *Deleted

## 2016-02-29 DIAGNOSIS — Z006 Encounter for examination for normal comparison and control in clinical research program: Secondary | ICD-10-CM

## 2016-02-29 NOTE — Progress Notes (Unsigned)
Daisy ButteClarina is here for month 4 visit [phone], A Randomized Trial to Prevent Vascular Events in HIV (The REPRIEVE Study). Verbalized excellent adherence. No missed doses. Denies any muscle aches or weakness. [facility] "[facility]" last week. Given a GI cocktail and dx with indigestion/GERD. Denies any further episodes of CP. No other complaints or concerns verbalized. Next study visit scheduled for June.

## 2016-03-11 ENCOUNTER — Encounter (HOSPITAL_COMMUNITY): Payer: Self-pay | Admitting: Emergency Medicine

## 2016-03-11 ENCOUNTER — Ambulatory Visit (HOSPITAL_COMMUNITY)
Admission: EM | Admit: 2016-03-11 | Discharge: 2016-03-11 | Disposition: A | Discharge status: Home / Self Care | Payer: Self-pay | Attending: Internal Medicine | Admitting: Internal Medicine

## 2016-03-11 DIAGNOSIS — M654 Radial styloid tenosynovitis [de Quervain]: Secondary | ICD-10-CM

## 2016-03-11 MED ORDER — KETOROLAC TROMETHAMINE 60 MG/2ML IM SOLN
INTRAMUSCULAR | Status: AC
  Filled 2016-03-11: qty 2

## 2016-03-11 MED ORDER — PREDNISONE 50 MG PO TABS: 50.0000 mg | ORAL_TABLET | Freq: Every day | ORAL | 0 refills | Status: DC

## 2016-03-11 MED ORDER — KETOROLAC TROMETHAMINE 60 MG/2ML IM SOLN
60.0000 mg | Freq: Once | INTRAMUSCULAR | Status: AC
  Administered 2016-03-11: 60 mg via INTRAMUSCULAR

## 2016-03-11 MED ORDER — NAPROXEN 500 MG PO TABS: 500.0000 mg | ORAL_TABLET | Freq: Two times a day (BID) | ORAL | 0 refills | Status: DC

## 2016-03-11 NOTE — ED Triage Notes (Signed)
[facility] a complaint of right hand pain x 2 months. The patient denied any known injury.

## 2016-03-11 NOTE — ED Provider Notes (Signed)
[facility]    CSN: [id number] Arrival date & time: 03/11/16  1540     History   Chief Complaint Chief Complaint  Patient presents with  . Hand Pain    HPI Daisy Salinas is a 56 y.o. female. She presents today with a 1-2 month history of pain and altered range of motion in her right thumb. She's had this a couple of times in the past, been diagnosed with tenosynovitis of the thumb.  Has had splinting, anti-inflammatories, steroid injection in the past, with relief. Also has some symptoms now in the left thumb.    HPI  Past Medical History:  Diagnosis Date  . Asthma   . Bipolar 1 disorder (HCC) 2009  . Depression   . GERD (gastroesophageal reflux disease)   . HIV (human immunodeficiency virus infection) (HCC)   . Hypertension   . TB (pulmonary tuberculosis) 1989   was exposed and treated  . UTI (urinary tract infection) 1997    Patient Active Problem List   Diagnosis Date Noted  . DJD (degenerative joint disease) 03/12/2016  . Right hip pain 08/08/2015  . SUBSTANCE ABUSE, MULTIPLE 11/30/2009  . BRONCHITIS, CHRONIC 01/03/2009  . CONSTIPATION, CHRONIC 05/09/2008  . DE QUERVAIN'S TENOSYNOVITIS, LEFT WRIST 05/09/2008  . Chronic hepatitis C without hepatic coma (HCC) 09/08/2007  . HYSTERECTOMY, HX OF 05/13/2006  . Depression 05/06/2006  . Human immunodeficiency virus (HIV) disease (HCC) 10/29/2005  . SYPHILIS, LATE, LATENT 10/29/2005  . DYSLIPIDEMIA 10/29/2005  . CIGARETTE SMOKER 10/29/2005  . HYPERTENSION 10/29/2005  . PROLAPSE, VAGINAL WALL NOS 10/29/2005  . MENORRHAGIA 10/29/2005  . INSOMNIA 10/29/2005    Past Surgical History:  Procedure Laterality Date  . ABDOMINAL HYSTERECTOMY      Home Medications    Prior to Admission medications   Medication Sig Start Date End Date Taking? Authorizing Provider  divalproex (DEPAKOTE) 500 MG DR tablet Take 500 mg by mouth in the morning and 1000 mg tablets by mouth each evening    Historical Provider,  MD  elvitegravir-cobicistat-emtricitabine-tenofovir (GENVOYA) 150-150-200-10 MG TABS tablet Take 1 tablet by mouth daily with breakfast. 05/02/15   Cliffton Asters, MD  hydrochlorothiazide (HYDRODIURIL) 25 MG tablet Take 1 tablet (25 mg total) by mouth daily. 05/02/15   Cliffton Asters, MD  lisinopril (PRINIVIL,ZESTRIL) 20 MG tablet Take 1 tablet (20 mg total) by mouth daily. 05/02/15   Cliffton Asters, MD  naproxen (NAPROSYN) 500 MG tablet Take 1 tablet (500 mg total) by mouth 2 (two) times daily. 03/11/16   Eustace Moore, MD  Pitavastatin Calcium 4 MG TABS Take 1 tablet by mouth daily. This is study provided and may be placebo. Do not fill prescription. 11/01/15   Historical Provider, MD  predniSONE (DELTASONE) 50 MG tablet Take 1 tablet (50 mg total) by mouth daily. Patient not taking: Reported on 03/12/2016 03/11/16   Eustace Moore, MD  QUEtiapine Fumarate (SEROQUEL XR) 150 MG 24 hr tablet Take 150 mg in the morning and 300 mg at bedtime    Historical Provider, MD  ranitidine (ZANTAC) 150 MG tablet Take 1 tablet (150 mg total) by mouth 2 (two) times daily. 02/20/16   Mercedes Street, PA-C  sertraline (ZOLOFT) 50 MG tablet Take 50 mg by mouth daily.    Historical Provider, MD  zolpidem (AMBIEN) 5 MG tablet Take 5 mg by mouth at bedtime.     Historical Provider, MD    Family History Family History  Problem Relation Age of Onset  . Coronary artery  disease Mother   . Leukemia Father     Social History Social History  Substance Use Topics  . Smoking status: Current Every Day Smoker    Packs/day: 0.50    Years: 40.00    Types: Cigarettes  . Smokeless tobacco: Never Used     Comment: wanting "some pills" to help her stop  . Alcohol use 38.4 oz/week    64 Standard drinks or equivalent per week     Allergies   Patient has no known allergies.   Review of Systems Review of Systems  All other systems reviewed and are negative.    Physical Exam Triage Vital Signs ED Triage Vitals  Enc  Vitals Group     BP 03/11/16 1651 145/60     Pulse Rate 03/11/16 1651 62     Resp 03/11/16 1651 18     Temp 03/11/16 1651 98.3 F (36.8 C)     Temp Source 03/11/16 1651 Oral     SpO2 03/11/16 1651 98 %     Weight --      Height --      Pain Score 03/11/16 1652 8   Updated Vital Signs BP 145/60 (BP Location: Right Arm)   Pulse 62   Temp 98.3 F (36.8 C) (Oral)   Resp 18   SpO2 98%   Physical Exam  Constitutional: She is oriented to person, place, and time. No distress.  Alert, nicely groomed  HENT:  Head: Atraumatic.  Eyes:  Conjugate gaze, no eye redness/drainage  Neck: Neck supple.  Cardiovascular: Normal rate.   Pulmonary/Chest: No respiratory distress.  Abdominal: She exhibits no distension.  Musculoskeletal: Normal range of motion.  No leg swelling Base of the right thumb at the proximal metacarpal area is tender and slightly swollen. The extensor tendon feels slightly swollen and is tender along its course. Restricted extension of the thumb. No warmth/erythema.  Neurological: She is alert and oriented to person, place, and time.  Skin: Skin is warm and dry.  No cyanosis  Nursing note and vitals reviewed.    UC Treatments / Results   Procedures Procedures (including critical care time)  Medications Ordered in UC Medications  ketorolac (TORADOL) injection 60 mg (60 mg Intramuscular Given 03/11/16 1821)    Final Clinical Impressions(s) / UC Diagnoses   Final diagnoses:  Tenosynovitis, de Quervain   Symptoms suggest right thumb tendinitis.  Splinting and anti inflammatories are a good start:  wear splint as often as possible and apply ice 5-10 minutes several times daily.   Prescriptions for prednisone and naproxen were sent to the [facility] on [address].  Followup with orthopedist to discuss possible tendon sheath injection if not improving in a week or two.  New Prescriptions Discharge Medication List as of 03/11/2016  5:50 PM    START  taking these medications   Details  naproxen (NAPROSYN) 500 MG tablet Take 1 tablet (500 mg total) by mouth 2 (two) times daily., Starting Mon 03/11/2016, Normal    predniSONE (DELTASONE) 50 MG tablet Take 1 tablet (50 mg total) by mouth daily., Starting Mon 03/11/2016, Normal         Eustace MooreLaura W Micheale Schlack, MD 03/12/16 [phone]

## 2016-03-11 NOTE — Discharge Instructions (Addendum)
Symptoms suggest right thumb tendinitis.  Splinting and anti inflammatories are a good start:  wear splint as often as possible and apply ice 5-10 minutes several times daily.   Prescriptions for prednisone and naproxen were sent to the [facility] on [address].  Followup with orthopedist to discuss possible tendon sheath injection if not improving in a week or two.

## 2016-03-12 ENCOUNTER — Encounter: Payer: Self-pay | Admitting: Internal Medicine

## 2016-03-12 ENCOUNTER — Ambulatory Visit (INDEPENDENT_AMBULATORY_CARE_PROVIDER_SITE_OTHER): Payer: Self-pay | Admitting: Internal Medicine

## 2016-03-12 DIAGNOSIS — F3342 Major depressive disorder, recurrent, in full remission: Secondary | ICD-10-CM

## 2016-03-12 DIAGNOSIS — M159 Polyosteoarthritis, unspecified: Secondary | ICD-10-CM

## 2016-03-12 DIAGNOSIS — F17200 Nicotine dependence, unspecified, uncomplicated: Secondary | ICD-10-CM

## 2016-03-12 DIAGNOSIS — M1990 Unspecified osteoarthritis, unspecified site: Secondary | ICD-10-CM | POA: Insufficient documentation

## 2016-03-12 DIAGNOSIS — B20 Human immunodeficiency virus [HIV] disease: Secondary | ICD-10-CM

## 2016-03-12 NOTE — Assessment & Plan Note (Signed)
Her adherence has been excellent recently and as a result her infection is under good, long-term control. She will continue Genvoya in follow-up after lab work in 6 months.

## 2016-03-12 NOTE — Assessment & Plan Note (Signed)
I talked to her again about the importance of cigarette cessation. We talked about a cessation plan.

## 2016-03-12 NOTE — Assessment & Plan Note (Signed)
Her chronic, relapsing depression is currently in remission. I did talk to her about the importance of trying to stop using cocaine which has exacerbated her depression in the past.

## 2016-03-12 NOTE — Assessment & Plan Note (Signed)
I suspect that her right thumb pain is due to some degenerative arthritis and repetitive use syndrome. I asked her to try taking acetaminophen on a regular basis to see if that helps.

## 2016-03-12 NOTE — Progress Notes (Signed)
Patient Active Problem List   Diagnosis Date Noted  . Chronic hepatitis C without hepatic coma (HCC) 09/08/2007    Priority: High  . Human immunodeficiency virus (HIV) disease (HCC) 10/29/2005    Priority: High  . DJD (degenerative joint disease) 03/12/2016  . Right hip pain 08/08/2015  . SUBSTANCE ABUSE, MULTIPLE 11/30/2009  . BRONCHITIS, CHRONIC 01/03/2009  . CONSTIPATION, CHRONIC 05/09/2008  . DE QUERVAIN'S TENOSYNOVITIS, LEFT WRIST 05/09/2008  . HYSTERECTOMY, HX OF 05/13/2006  . Depression 05/06/2006  . SYPHILIS, LATE, LATENT 10/29/2005  . DYSLIPIDEMIA 10/29/2005  . CIGARETTE SMOKER 10/29/2005  . HYPERTENSION 10/29/2005  . PROLAPSE, VAGINAL WALL NOS 10/29/2005  . MENORRHAGIA 10/29/2005  . INSOMNIA 10/29/2005    Patient's Medications  New Prescriptions   No medications on file  Previous Medications   DIVALPROEX (DEPAKOTE) 500 MG DR TABLET    Take 500 mg by mouth in the morning and 1000 mg tablets by mouth each evening   ELVITEGRAVIR-COBICISTAT-EMTRICITABINE-TENOFOVIR (GENVOYA) 150-150-200-10 MG TABS TABLET    Take 1 tablet by mouth daily with breakfast.   HYDROCHLOROTHIAZIDE (HYDRODIURIL) 25 MG TABLET    Take 1 tablet (25 mg total) by mouth daily.   LISINOPRIL (PRINIVIL,ZESTRIL) 20 MG TABLET    Take 1 tablet (20 mg total) by mouth daily.   NAPROXEN (NAPROSYN) 500 MG TABLET    Take 1 tablet (500 mg total) by mouth 2 (two) times daily.   PITAVASTATIN CALCIUM 4 MG TABS    Take 1 tablet by mouth daily. This is study provided and may be placebo. Do not fill prescription.   PREDNISONE (DELTASONE) 50 MG TABLET    Take 1 tablet (50 mg total) by mouth daily.   QUETIAPINE FUMARATE (SEROQUEL XR) 150 MG 24 HR TABLET    Take 150 mg in the morning and 300 mg at bedtime   RANITIDINE (ZANTAC) 150 MG TABLET    Take 1 tablet (150 mg total) by mouth 2 (two) times daily.   SERTRALINE (ZOLOFT) 50 MG TABLET    Take 50 mg by mouth daily.   ZOLPIDEM (AMBIEN) 5 MG TABLET    Take 5 mg  by mouth at bedtime.   Modified Medications   No medications on file  Discontinued Medications   No medications on file    Subjective: Denaly is in for her routine HIV follow-up visit. She has had no problems obtaining, taking or tolerating her Genvoya. She takes it each afternoon around 3 PM with food. She does not recall missing any doses. She has had some recent pain at the base of her right thumb. He states that this is exacerbated by repetitive activity at work. She will occasionally take ibuprofen with only modest relief. She was seen at urgent care yesterday and given a shot in her but and some prescription medication which she has not filled yet. She continues to smoke cigarettes and has no current plan to quit. She last used cocaine about 3 weeks ago. She states that she does not feel depressed.  Review of Systems: Review of Systems  Constitutional: Negative for chills, diaphoresis, fever, malaise/fatigue and weight loss.  HENT: Negative for sore throat.   Respiratory: Positive for cough and shortness of breath. Negative for sputum production.   Cardiovascular: Negative for chest pain.  Gastrointestinal: Negative for abdominal pain, diarrhea, heartburn, nausea and vomiting.  Genitourinary: Negative for dysuria and frequency.  Musculoskeletal: Positive for joint pain. Negative for myalgias.  Skin: Negative for rash.  Neurological: Negative for dizziness and headaches.  Psychiatric/Behavioral: Positive for substance abuse. Negative for depression. The patient is not nervous/anxious.     Past Medical History:  Diagnosis Date  . Asthma   . Bipolar 1 disorder (HCC) 2009  . Depression   . GERD (gastroesophageal reflux disease)   . HIV (human immunodeficiency virus infection) (HCC)   . Hypertension   . TB (pulmonary tuberculosis) 1989   was exposed and treated  . UTI (urinary tract infection) 1997    Social History  Substance Use Topics  . Smoking status: Current Every Day  Smoker    Packs/day: 0.50    Years: 40.00    Types: Cigarettes  . Smokeless tobacco: Never Used     Comment: wanting "some pills" to help her stop  . Alcohol use 38.4 oz/week    64 Standard drinks or equivalent per week    Family History  Problem Relation Age of Onset  . Coronary artery disease Mother   . Leukemia Father     No Known Allergies  Objective:  Vitals:   03/12/16 0959  BP: 138/84  Pulse: 65  Temp: 98.1 F (36.7 C)  TempSrc: Oral  Weight: 150 lb (68 kg)  Height: 5' (1.524 m)   Body mass index is 29.29 kg/m.  Physical Exam  Constitutional: She is oriented to person, place, and time.  She is in good spirits.  HENT:  Mouth/Throat: No oropharyngeal exudate.  Eyes: Conjunctivae are normal.  Cardiovascular: Normal rate and regular rhythm.   No murmur heard. Pulmonary/Chest: Effort normal and breath sounds normal. She has no wheezes. She has no rales.  Abdominal: Soft. She exhibits no mass. There is no tenderness.  Musculoskeletal: Normal range of motion. She exhibits edema and tenderness.  She has some mild swelling at the base of her right thumb without erythema or warmth. She has very mild tenderness with palpation.  Neurological: She is alert and oriented to person, place, and time.  Skin: No rash noted.  Psychiatric: Mood and affect normal.    Lab Results Lab Results  Component Value Date   WBC 5.6 02/26/2016   HGB 11.5 (L) 02/26/2016   HCT 33.4 (L) 02/26/2016   MCV 97.9 02/26/2016   PLT 102 (L) 02/26/2016    Lab Results  Component Value Date   CREATININE 0.97 02/26/2016   BUN 15 02/26/2016   NA 141 02/26/2016   K 3.2 (L) 02/26/2016   CL 109 02/26/2016   CO2 26 02/26/2016    Lab Results  Component Value Date   ALT 4 (L) 02/26/2016   AST 19 02/26/2016   ALKPHOS 43 02/26/2016   BILITOT 0.4 02/26/2016    Lab Results  Component Value Date   CHOL 152 02/26/2016   HDL 30 (L) 02/26/2016   LDLCALC 56 02/26/2016   TRIG 331 (H) 02/26/2016    CHOLHDL 5.1 (H) 02/26/2016   HIV 1 RNA Quant (copies/mL)  Date Value  02/26/2016 <20 DETECTED (A)  07/19/2015 <20  05/16/2014 <20   CD4 T Cell Abs (/uL)  Date Value  02/26/2016 1,490  07/19/2015 1,490  05/16/2014 1,380     Problem List Items Addressed This Visit      High   Human immunodeficiency virus (HIV) disease (HCC)    Her adherence has been excellent recently and as a result her infection is under good, long-term control. She will continue Genvoya in follow-up after lab work in 6 months.      Relevant Orders  T-helper cell (CD4)- ([facility] clinic only)   HIV 1 RNA quant-no reflex-bld     Unprioritized   CIGARETTE SMOKER    I talked to her again about the importance of cigarette cessation. We talked about a cessation plan.      Depression    Her chronic, relapsing depression is currently in remission. I did talk to her about the importance of trying to stop using cocaine which has exacerbated her depression in the past.      DJD (degenerative joint disease)    I suspect that her right thumb pain is due to some degenerative arthritis and repetitive use syndrome. I asked her to try taking acetaminophen on a regular basis to see if that helps.           Cliffton AstersJohn Tameeka Luo, MD [facility] [phone] pager   [phone] cell 03/12/2016, 11:42 AM

## 2016-03-27 ENCOUNTER — Other Ambulatory Visit: Payer: Self-pay | Admitting: Pharmacist Clinician (PhC)/ Clinical Pharmacy Specialist

## 2016-03-27 ENCOUNTER — Telehealth: Payer: Self-pay | Admitting: *Deleted

## 2016-03-27 MED ORDER — OSELTAMIVIR PHOSPHATE 75 MG PO CAPS: 75.0000 mg | ORAL_CAPSULE | Freq: Every day | ORAL | 0 refills | Status: AC

## 2016-03-27 NOTE — Telephone Encounter (Signed)
Patient called to report that her husband is in the hospital with the Flu and she was advised to contact our office to get an RX for [name]. He has been in for 2 days and should be released tomorrow 03/28/16. She advised at this point she feels fine no fever, cough, or body aches. Advised will asks the doctor and give her a call back. Verified the pharmacy and contact number.

## 2016-03-27 NOTE — Progress Notes (Signed)
Daisy Salinas walked into the clinic today because [name] (husband) went to the ED for the flu today. He is PCR positive. She has no symptoms for the flu today. D/w Dr. Drue SecondSnider, we'll give her 10 prophylaxis. [facility]

## 2016-03-28 ENCOUNTER — Other Ambulatory Visit: Payer: Self-pay | Admitting: Internal Medicine

## 2016-03-28 DIAGNOSIS — I10 Essential (primary) hypertension: Secondary | ICD-10-CM

## 2016-04-08 NOTE — Telephone Encounter (Signed)
It is too late for [name] now.

## 2016-04-09 ENCOUNTER — Encounter: Payer: Self-pay | Admitting: Internal Medicine

## 2016-04-24 ENCOUNTER — Other Ambulatory Visit: Payer: Self-pay | Admitting: Internal Medicine

## 2016-04-24 DIAGNOSIS — B20 Human immunodeficiency virus [HIV] disease: Secondary | ICD-10-CM

## 2016-04-24 DIAGNOSIS — I10 Essential (primary) hypertension: Secondary | ICD-10-CM

## 2016-07-04 ENCOUNTER — Encounter (INDEPENDENT_AMBULATORY_CARE_PROVIDER_SITE_OTHER): Payer: Self-pay | Admitting: *Deleted

## 2016-07-04 VITALS — BP 107/70 | HR 72 | Temp 98.6°F | Wt 147.0 lb

## 2016-07-04 DIAGNOSIS — Z006 Encounter for examination for normal comparison and control in clinical research program: Secondary | ICD-10-CM

## 2016-07-04 NOTE — Progress Notes (Signed)
Daisy Salinas was here for her 8 month visit for Reprieve, A Randomized Trial to Prevent Vascular Events in HIV (study drug is Pitavastatin 4mg  or placebo).  She denies any new problems except for some cramping in her rt ft. She doesn't have any new medicatiosn or other  Problems. She says her adherence is excellent and will return for study in October.

## 2016-07-23 ENCOUNTER — Emergency Department (HOSPITAL_COMMUNITY)
Admission: EM | Admit: 2016-07-23 | Discharge: 2016-07-23 | Disposition: A | Discharge status: Home / Self Care | Payer: Self-pay | Attending: Emergency Medicine | Admitting: Emergency Medicine

## 2016-07-23 ENCOUNTER — Encounter (HOSPITAL_COMMUNITY): Payer: Self-pay | Admitting: Emergency Medicine

## 2016-07-23 DIAGNOSIS — J45909 Unspecified asthma, uncomplicated: Secondary | ICD-10-CM | POA: Insufficient documentation

## 2016-07-23 DIAGNOSIS — Z79899 Other long term (current) drug therapy: Secondary | ICD-10-CM | POA: Insufficient documentation

## 2016-07-23 DIAGNOSIS — F17210 Nicotine dependence, cigarettes, uncomplicated: Secondary | ICD-10-CM | POA: Insufficient documentation

## 2016-07-23 DIAGNOSIS — R45851 Suicidal ideations: Secondary | ICD-10-CM | POA: Insufficient documentation

## 2016-07-23 DIAGNOSIS — I10 Essential (primary) hypertension: Secondary | ICD-10-CM | POA: Insufficient documentation

## 2016-07-23 DIAGNOSIS — F1414 Cocaine abuse with cocaine-induced mood disorder: Secondary | ICD-10-CM | POA: Diagnosis present

## 2016-07-23 LAB — COMPREHENSIVE METABOLIC PANEL
ALK PHOS: 45 U/L (ref 38–126)
ALT: 7 U/L — ABNORMAL LOW (ref 14–54)
ANION GAP: 9 (ref 5–15)
AST: 24 U/L (ref 15–41)
Albumin: 4.4 g/dL (ref 3.5–5.0)
BILIRUBIN TOTAL: 0.4 mg/dL (ref 0.3–1.2)
BUN: 10 mg/dL (ref 6–20)
CALCIUM: 9.5 mg/dL (ref 8.9–10.3)
CO2: 26 mmol/L (ref 22–32)
Chloride: 103 mmol/L (ref 101–111)
Creatinine, Ser: 0.81 mg/dL (ref 0.44–1.00)
GFR calc Af Amer: >60 mL/min (ref >60)
Glucose, Bld: 133 mg/dL — ABNORMAL HIGH (ref 65–99)
POTASSIUM: 2.8 mmol/L — AB (ref 3.5–5.1)
Sodium: 138 mmol/L (ref 135–145)
TOTAL PROTEIN: 8.6 g/dL — AB (ref 6.5–8.1)

## 2016-07-23 LAB — CBC
HCT: 36.6 % (ref 36.0–46.0)
Hemoglobin: 13 g/dL (ref 12.0–15.0)
MCH: 33.9 pg (ref 26.0–34.0)
MCHC: 35.5 g/dL (ref 30.0–36.0)
MCV: 95.3 fL (ref 78.0–100.0)
PLATELETS: 109 10*3/uL — AB (ref 150–400)
RBC: 3.84 MIL/uL — ABNORMAL LOW (ref 3.87–5.11)
RDW: 13.4 % (ref 11.5–15.5)
WBC: 6 10*3/uL (ref 4.0–10.5)

## 2016-07-23 LAB — ACETAMINOPHEN LEVEL

## 2016-07-23 LAB — SALICYLATE LEVEL: Salicylate Lvl: <7 mg/dL (ref 2.8–30.0)

## 2016-07-23 LAB — ETHANOL

## 2016-07-23 LAB — RAPID URINE DRUG SCREEN, HOSP PERFORMED
Amphetamines: NOT DETECTED
BARBITURATES: NOT DETECTED
BENZODIAZEPINES: NOT DETECTED
COCAINE: POSITIVE — AB
Opiates: NOT DETECTED
Tetrahydrocannabinol: POSITIVE — AB

## 2016-07-23 MED ORDER — IBUPROFEN 200 MG PO TABS: 600.0000 mg | ORAL_TABLET | Freq: Three times a day (TID) | ORAL | Status: DC | PRN

## 2016-07-23 MED ORDER — ONDANSETRON HCL 4 MG PO TABS
4.0000 mg | ORAL_TABLET | Freq: Three times a day (TID) | ORAL | Status: DC | PRN
Start: 2016-07-23 — End: 2016-07-23

## 2016-07-23 MED ORDER — NICOTINE 21 MG/24HR TD PT24: 21.0000 mg | MEDICATED_PATCH | Freq: Every day | TRANSDERMAL | Status: DC

## 2016-07-23 MED ORDER — POTASSIUM CHLORIDE CRYS ER 20 MEQ PO TBCR
60.0000 meq | EXTENDED_RELEASE_TABLET | Freq: Once | ORAL | Status: AC
  Administered 2016-07-23: 60 meq via ORAL
  Filled 2016-07-23: qty 3

## 2016-07-23 MED ORDER — POTASSIUM CHLORIDE CRYS ER 20 MEQ PO TBCR: 20.0000 meq | EXTENDED_RELEASE_TABLET | Freq: Two times a day (BID) | ORAL | Status: DC

## 2016-07-23 MED ORDER — ACETAMINOPHEN 325 MG PO TABS: 650.0000 mg | ORAL_TABLET | ORAL | Status: DC | PRN

## 2016-07-23 MED ORDER — ALUM & MAG HYDROXIDE-SIMETH 200-200-20 MG/5ML PO SUSP: 30.0000 mL | Freq: Four times a day (QID) | ORAL | Status: DC | PRN

## 2016-07-23 NOTE — ED Provider Notes (Signed)
[facility]-EMERGENCY DEPT Provider Note   CSN: [id number] Arrival date & time: 07/23/16  0156     History   Chief Complaint Chief Complaint  Patient presents with  . Suicidal    HPI Daisy Salinas is a 56 y.o. female.  The history is provided by the patient.  Depression  This is a chronic problem. The current episode started more than 1 week ago. The problem occurs constantly. The problem has been gradually worsening. Pertinent negatives include no chest pain, no abdominal pain, no headaches and no shortness of breath. Nothing aggravates the symptoms. Nothing relieves the symptoms. She has tried nothing for the symptoms. The treatment provided no relief.  Is hearing voices but could not be specific about what they are saying.  Has SI but cannot elaborate on a plan.    Past Medical History:  Diagnosis Date  . Asthma   . Bipolar 1 disorder (HCC) 2009  . Depression   . GERD (gastroesophageal reflux disease)   . HIV (human immunodeficiency virus infection) (HCC)   . Hypertension   . TB (pulmonary tuberculosis) 1989   was exposed and treated  . UTI (urinary tract infection) 1997    Patient Active Problem List   Diagnosis Date Noted  . DJD (degenerative joint disease) 03/12/2016  . Right hip pain 08/08/2015  . SUBSTANCE ABUSE, MULTIPLE 11/30/2009  . BRONCHITIS, CHRONIC 01/03/2009  . CONSTIPATION, CHRONIC 05/09/2008  . DE QUERVAIN'S TENOSYNOVITIS, LEFT WRIST 05/09/2008  . Chronic hepatitis C without hepatic coma (HCC) 09/08/2007  . HYSTERECTOMY, HX OF 05/13/2006  . Depression 05/06/2006  . Human immunodeficiency virus (HIV) disease (HCC) 10/29/2005  . SYPHILIS, LATE, LATENT 10/29/2005  . DYSLIPIDEMIA 10/29/2005  . CIGARETTE SMOKER 10/29/2005  . HYPERTENSION 10/29/2005  . PROLAPSE, VAGINAL WALL NOS 10/29/2005  . MENORRHAGIA 10/29/2005  . INSOMNIA 10/29/2005    Past Surgical History:  Procedure Laterality Date  . ABDOMINAL HYSTERECTOMY      OB History    No data  available       Home Medications    Prior to Admission medications   Medication Sig Start Date End Date Taking? Authorizing Provider  divalproex (DEPAKOTE) 500 MG DR tablet Take 500 mg by mouth in the morning and 1000 mg tablets by mouth each evening    [provider]  GENVOYA 150-150-200-10 MG TABS tablet TAKE 1 TABLET BY MOUTH DAILY WITH BREAKFAST 04/24/16   Cliffton Astersampbell, John, MD  hydrochlorothiazide (HYDRODIURIL) 25 MG tablet TAKE 1 TABLET(25 MG) BY MOUTH DAILY 04/24/16   Cliffton Astersampbell, John, MD  lisinopril (PRINIVIL,ZESTRIL) 20 MG tablet TAKE 1 TABLET(20 MG) BY MOUTH DAILY 04/24/16   Cliffton Astersampbell, John, MD  naproxen (NAPROSYN) 500 MG tablet Take 1 tablet (500 mg total) by mouth 2 (two) times daily. 03/11/16   Eustace MooreMurray, Laura W, MD  Pitavastatin Calcium 4 MG TABS Take 1 tablet by mouth daily. This is study provided and may be placebo. Do not fill prescription. 11/01/15   [provider]  predniSONE (DELTASONE) 50 MG tablet Take 1 tablet (50 mg total) by mouth daily. Patient not taking: Reported on 03/12/2016 03/11/16   Eustace MooreMurray, Laura W, MD  QUEtiapine Fumarate (SEROQUEL XR) 150 MG 24 hr tablet Take 150 mg in the morning and 300 mg at bedtime    [provider]  ranitidine (ZANTAC) 150 MG tablet Take 1 tablet (150 mg total) by mouth 2 (two) times daily. 02/20/16   Street, Mercedes, PA-C  sertraline (ZOLOFT) 50 MG tablet Take 50 mg by mouth daily.  [provider]  zolpidem (AMBIEN) 5 MG tablet Take 5 mg by mouth at bedtime.     [provider]    Family History Family History  Problem Relation Age of Onset  . Coronary artery disease Mother   . Leukemia Father     Social History Social History  Substance Use Topics  . Smoking status: Current Every Day Smoker    Packs/day: 0.50    Years: 40.00    Types: Cigarettes  . Smokeless tobacco: Never Used     Comment: wanting "some pills" to help her stop  . Alcohol use 38.4 oz/week    64 Standard drinks or  equivalent per week     Allergies   Patient has no known allergies.   Review of Systems Review of Systems  Respiratory: Negative for shortness of breath.   Cardiovascular: Negative for chest pain.  Gastrointestinal: Negative for abdominal pain.  Neurological: Negative for headaches.  Psychiatric/Behavioral: Positive for depression. Negative for self-injury and sleep disturbance. The patient is not nervous/anxious and is not hyperactive.   All other systems reviewed and are negative.    Physical Exam Updated Vital Signs BP (!) 156/92   Pulse 83   Temp 98.1 F (36.7 C)   Resp 16   SpO2 95%   Physical Exam  Constitutional: She is oriented to person, place, and time. She appears well-developed and well-nourished. No distress.  HENT:  Head: Normocephalic and atraumatic.  Mouth/Throat: No oropharyngeal exudate.  Eyes: Conjunctivae are normal. Pupils are equal, round, and reactive to light.  Neck: Normal range of motion. Neck supple.  Cardiovascular: Normal rate, regular rhythm, normal heart sounds and intact distal pulses.   Pulmonary/Chest: Effort normal and breath sounds normal.  Abdominal: Soft. Bowel sounds are normal. She exhibits no mass. There is no tenderness. There is no rebound and no guarding.  Musculoskeletal: Normal range of motion.  Neurological: She is alert and oriented to person, place, and time.  Skin: Skin is warm and dry. Capillary refill takes less than 2 seconds.  Psychiatric: She expresses no homicidal ideation. She expresses no suicidal plans and no homicidal plans.     ED Treatments / Results   Vitals:   07/23/16 0203  BP: (!) 156/92  Pulse: 83  Resp: 16  Temp: 98.1 F (36.7 C)    Labs (all labs ordered are listed, but only abnormal results are displayed)  Results for orders placed or performed during the hospital encounter of 07/23/16  Comprehensive metabolic panel  Result Value Ref Range   Sodium 138 135 - 145 mmol/L   Potassium 2.8  (L) 3.5 - 5.1 mmol/L   Chloride 103 101 - 111 mmol/L   CO2 26 22 - 32 mmol/L   Glucose, Bld 133 (H) 65 - 99 mg/dL   BUN 10 6 - 20 mg/dL   Creatinine, Ser 1.61 0.44 - 1.00 mg/dL   Calcium 9.5 8.9 - 09.6 mg/dL   Total Protein 8.6 (H) 6.5 - 8.1 g/dL   Albumin 4.4 3.5 - 5.0 g/dL   AST 24 15 - 41 U/L   ALT 7 (L) 14 - 54 U/L   Alkaline Phosphatase 45 38 - 126 U/L   Total Bilirubin 0.4 0.3 - 1.2 mg/dL   GFR calc non Af Amer >60 >60 mL/min   GFR calc Af Amer >60 >60 mL/min   Anion gap 9 5 - 15  Ethanol  Result Value Ref Range   Alcohol, Ethyl (B) <5 <5 mg/dL  Salicylate level  Result Value Ref Range   Salicylate Lvl <7.0 2.8 - 30.0 mg/dL  Acetaminophen level  Result Value Ref Range   Acetaminophen (Tylenol), Serum <10 (L) 10 - 30 ug/mL  cbc  Result Value Ref Range   WBC 6.0 4.0 - 10.5 K/uL   RBC 3.84 (L) 3.87 - 5.11 MIL/uL   Hemoglobin 13.0 12.0 - 15.0 g/dL   HCT 16.1 09.6 - 04.5 %   MCV 95.3 78.0 - 100.0 fL   MCH 33.9 26.0 - 34.0 pg   MCHC 35.5 30.0 - 36.0 g/dL   RDW 40.9 81.1 - 91.4 %   Platelets 109 (L) 150 - 400 K/uL  Rapid urine drug screen (hospital performed)  Result Value Ref Range   Opiates NONE DETECTED NONE DETECTED   Cocaine POSITIVE (A) NONE DETECTED   Benzodiazepines NONE DETECTED NONE DETECTED   Amphetamines NONE DETECTED NONE DETECTED   Tetrahydrocannabinol POSITIVE (A) NONE DETECTED   Barbiturates NONE DETECTED NONE DETECTED   No results found.  Procedures Procedures (including critical care time)    Have treated the potassium with supplementation.  She has a h/o being low in the past  Final Clinical Impressions(s) / ED Diagnoses  Will need inpatient stabilization.  Has been medically cleared by me    Nicanor Alcon, Josias Tomerlin, MD 07/23/16 [phone]

## 2016-07-23 NOTE — ED Notes (Signed)
Patient going over discharge paperwork and gave her her belongings.  Patient states again, "I am not leaving, I just wanted my phone!"  Informed patient that I spoke with [facility] and they have you discharged and you can follow up with place listed on your paperwork for any drug or alcohol problem.  Patient states, "I am hearing voices and not safe for me to go home and be alone."  Informed patient that Dr A stated that patient has no medically reason to stay in hospital at this time.  Patient states again, "I am going to go home and take bunch of pills and going to be on yall".   Informed patient that this RN would [facility] staff that you would like to speak with them again and her concerns. Called and spoke with Dr A with [facility] again about patient not wanting to leave and needing help with drug problem and how I explained to her where she can go for help today on her paperwork. Also informed him that made comment about going home and taking pills to kill herself would be on us.  He stated that she was medically and mentally clear to leave, she can follow up with place for her drug problem and this RN needed to call security to escort patient out of ED.  This RN notified security and off duty [employer], who escorted patient out ot ED.

## 2016-07-23 NOTE — ED Notes (Signed)
Bed: WTR7 Expected date:  Expected time:  Means of arrival:  Comments: 

## 2016-07-23 NOTE — ED Notes (Signed)
Pt can't be placed to [facility], because her nephew is a patient there. Charge nurse notified.

## 2016-07-23 NOTE — ED Notes (Signed)
Patient wanting her cell phone. So I explained to patient our policy and offered her a portable phone to use until she is discharged then she can have her phone back.   Patient states, "I want to leave and when I go home and take hand full of pills and kill myself, it will be on yall!"   I informed patient that she can't leave or have her stuff until the doctor seeing her states she can leave.   Made Dr Eudelia Bunchardama aware of situation. Will in physicians office, Joselyn Glassmanyler PA informed this RN that [facility] was just talking to patient. So I called and spoke with Dr A with [facility] and was told the plan will be to discharge patient, but they have many charts to work on so might be a while before they can get to hers.  Asked Dr A if they could expedite this certain patient's discharge, due to her demanding to leave and getting upset with ED staff and pacing hallway.  Was told that he will talk it over with [facility], with [facility], and they will then decide to speed up her discharge.

## 2016-07-23 NOTE — [facility] Assessment (Signed)
[facility] Assessment Progress Note  Per Mojeed Akintayo, MD, this pt does not require psychiatric hospitalization at this time.  Pt is to be discharged from [facility] with recommendation to follow [facility] with [facility].  This has been included in pt's discharge instructions.  Pt's nurse has been notified.  Evangelene Vora, MA Triage Specialist [phone]     
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
## 2016-07-23 NOTE — BHH Suicide Risk Assessment (Signed)
Suicide Risk Assessment  Discharge Assessment   [facility]   Principal Problem: Cocaine abuse with cocaine-induced mood disorder [facility](HCC) Discharge Diagnoses:  Patient Active Problem List   Diagnosis Date Noted  . Cocaine abuse with cocaine-induced mood disorder (HCC) [F14.14] 07/23/2016    Priority: High  . DJD (degenerative joint disease) [M19.90] 03/12/2016  . Right hip pain [M25.551] 08/08/2015  . SUBSTANCE ABUSE, MULTIPLE [F19.10] 11/30/2009  . BRONCHITIS, CHRONIC [J42] 01/03/2009  . CONSTIPATION, CHRONIC [K59.09] 05/09/2008  . DE QUERVAIN'S TENOSYNOVITIS, LEFT WRIST [M65.4] 05/09/2008  . Chronic hepatitis C without hepatic coma (HCC) [B18.2] 09/08/2007  . HYSTERECTOMY, HX OF [Z90.79] 05/13/2006  . Depression [F32.9] 05/06/2006  . Human immunodeficiency virus (HIV) disease (HCC) [B20] 10/29/2005  . SYPHILIS, LATE, LATENT [A52.8] 10/29/2005  . DYSLIPIDEMIA [E78.5] 10/29/2005  . CIGARETTE SMOKER [F17.200] 10/29/2005  . HYPERTENSION [I10] 10/29/2005  . PROLAPSE, VAGINAL WALL NOS [N81.10] 10/29/2005  . MENORRHAGIA [N92.0] 10/29/2005  . INSOMNIA [G47.00] 10/29/2005    Total Time spent with patient: 45 minutes  Musculoskeletal: Strength & Muscle Tone: within normal limits Gait & Station: normal Patient leans: N/A  Psychiatric Specialty Exam:   Blood pressure 120/72, pulse 65, temperature 98.1 F (36.7 C), temperature source Oral, resp. rate 18, SpO2 97 %.There is no height or weight on file to calculate BMI.  General Appearance: Casual  Eye Contact::  Fair  Speech:  Normal Rate409  Volume:  Normal  Mood:  Depressed, mild  Affect:  Congruent  Thought Process:  Coherent and Descriptions of Associations: Intact  Orientation:  Full (Time, Place, and Person)  Thought Content:  WDL and Logical  Suicidal Thoughts:  No  Homicidal Thoughts:  No  Memory:  Immediate;   Good Recent;   Good Remote;   Good  Judgement:  Fair  Insight:  Fair   Psychomotor Activity:  Normal  Concentration:  Good  Recall:  Fair  Fund of Knowledge:Fair  Language: Good  Akathisia:  No  Handed:  Right  AIMS (if indicated):     Assets:  Housing Intimacy Leisure Time Physical Health Resilience Social Support  Sleep:     Cognition: WNL  ADL's:  Intact   Mental Status Per Nursing Assessment::   On Admission:   Patient was using cocaine and became depressed because her husband is at a family reunion in [place] and will not be back until Sunday.  No suicidal/homicidal ideations, hallucinations, or withdrawal symptoms.  When discharge was discussed, she became upset because she did not want to stay by herself.  Encouraged to call family and friends to see if they can stay with her as she does not warrant admission.  Demographic Factors:  NA  Loss Factors: NA  Historical Factors: NA  Risk Reduction Factors:   Sense of responsibility to family, Living with another person, especially a relative and Positive social support  Continued Clinical Symptoms:  Depression, mild--situational  Cognitive Features That Contribute To Risk:  None    Suicide Risk:  Minimal: No identifiable suicidal ideation.  Patients presenting with no risk factors but with morbid ruminations; may be classified as minimal risk based on the severity of the depressive symptoms    Plan Of Care/Follow-up recommendations:  Activity:  as tolerated Diet:  heart healthy diet  Shaylon Aden, NP 07/23/2016, 10:06 AM

## 2016-07-23 NOTE — Discharge Instructions (Signed)
To help you maintain a sober lifestyle, a substance abuse treatment program may be beneficial to you.  Contact [facility] at your earliest opportunity to ask about enrolling in their program: ° °     [facility] °     [address] °     [place], [place] [address] °     [phone] °     New patients are seen at the walk-in clinic every Tuesday from 9:00 am - 12:00 pm. °

## 2016-07-23 NOTE — ED Triage Notes (Addendum)
Pt from home via PTAR with complaints of SI x 2 days. Pt states she has thoughts of self harm. Pt reports she misses her [place] who is in [place]. Pt reports she is "tired of her life style" because she smokes cocaine and marijuana. Pt denies pain. Pt denies HI. Pt reports auditory hallucinations but can not explain them. Pt cooperative at time of assessment

## 2016-08-26 ENCOUNTER — Ambulatory Visit: Payer: Self-pay | Admitting: Internal Medicine

## 2016-08-27 ENCOUNTER — Ambulatory Visit: Payer: Self-pay | Admitting: Internal Medicine

## 2016-09-18 ENCOUNTER — Encounter: Payer: Self-pay | Admitting: Internal Medicine

## 2016-09-27 ENCOUNTER — Other Ambulatory Visit: Payer: Self-pay

## 2016-09-27 DIAGNOSIS — B20 Human immunodeficiency virus [HIV] disease: Secondary | ICD-10-CM

## 2016-09-27 LAB — T-HELPER CELL (CD4) - (RCID CLINIC ONLY)
CD4 % Helper T Cell: 40 % (ref 33–55)
CD4 T CELL ABS: 1630 /uL (ref 400–2700)

## 2016-09-30 LAB — HIV-1 RNA QUANT-NO REFLEX-BLD
HIV 1 RNA QUANT: DETECTED {copies}/mL — AB
HIV-1 RNA Quant, Log: <1.3 Log copies/mL — AB

## 2016-10-03 ENCOUNTER — Ambulatory Visit: Payer: Self-pay | Admitting: Internal Medicine

## 2016-10-17 ENCOUNTER — Encounter (HOSPITAL_COMMUNITY): Payer: Self-pay

## 2016-10-17 ENCOUNTER — Emergency Department (HOSPITAL_COMMUNITY)
Admission: EM | Admit: 2016-10-17 | Discharge: 2016-10-17 | Disposition: A | Discharge status: Home / Self Care | Payer: Self-pay | Attending: Emergency Medicine | Admitting: Emergency Medicine

## 2016-10-17 ENCOUNTER — Emergency Department (HOSPITAL_COMMUNITY): Payer: Self-pay

## 2016-10-17 DIAGNOSIS — I10 Essential (primary) hypertension: Secondary | ICD-10-CM | POA: Insufficient documentation

## 2016-10-17 DIAGNOSIS — J45909 Unspecified asthma, uncomplicated: Secondary | ICD-10-CM | POA: Insufficient documentation

## 2016-10-17 DIAGNOSIS — M545 Low back pain, unspecified: Secondary | ICD-10-CM

## 2016-10-17 DIAGNOSIS — F17210 Nicotine dependence, cigarettes, uncomplicated: Secondary | ICD-10-CM | POA: Insufficient documentation

## 2016-10-17 DIAGNOSIS — Z79899 Other long term (current) drug therapy: Secondary | ICD-10-CM | POA: Insufficient documentation

## 2016-10-17 DIAGNOSIS — F1490 Cocaine use, unspecified, uncomplicated: Secondary | ICD-10-CM | POA: Insufficient documentation

## 2016-10-17 DIAGNOSIS — B20 Human immunodeficiency virus [HIV] disease: Secondary | ICD-10-CM | POA: Insufficient documentation

## 2016-10-17 LAB — URINALYSIS, ROUTINE W REFLEX MICROSCOPIC
BILIRUBIN URINE: NEGATIVE
GLUCOSE, UA: NEGATIVE mg/dL
HGB URINE DIPSTICK: NEGATIVE
Ketones, ur: NEGATIVE mg/dL
Leukocytes, UA: NEGATIVE
Nitrite: NEGATIVE
PROTEIN: NEGATIVE mg/dL
SPECIFIC GRAVITY, URINE: 1.012 (ref 1.005–1.030)
pH: 6 (ref 5.0–8.0)

## 2016-10-17 MED ORDER — CYCLOBENZAPRINE HCL 10 MG PO TABS: 10.0000 mg | ORAL_TABLET | Freq: Three times a day (TID) | ORAL | 0 refills | Status: DC | PRN

## 2016-10-17 MED ORDER — HYDROMORPHONE HCL 1 MG/ML IJ SOLN
2.0000 mg | Freq: Once | INTRAMUSCULAR | Status: AC
  Administered 2016-10-17: 2 mg via INTRAMUSCULAR
  Filled 2016-10-17: qty 2

## 2016-10-17 MED ORDER — CYCLOBENZAPRINE HCL 10 MG PO TABS
10.0000 mg | ORAL_TABLET | Freq: Once | ORAL | Status: AC
  Administered 2016-10-17: 10 mg via ORAL
  Filled 2016-10-17: qty 1

## 2016-10-17 MED ORDER — DEXTROSE 5 % IV SOLN
1000.0000 mg | Freq: Once | INTRAVENOUS | Status: DC
  Filled 2016-10-17: qty 10

## 2016-10-17 MED ORDER — IBUPROFEN 400 MG PO TABS
600.0000 mg | ORAL_TABLET | Freq: Once | ORAL | Status: AC
  Administered 2016-10-17: 600 mg via ORAL
  Filled 2016-10-17: qty 1

## 2016-10-17 MED ORDER — METHOCARBAMOL 1000 MG/10ML IJ SOLN
1000.0000 mg | Freq: Once | INTRAMUSCULAR | Status: DC
  Filled 2016-10-17: qty 10

## 2016-10-17 NOTE — ED Provider Notes (Signed)
[facility]-EMERGENCY DEPT Provider Note   CSN: [id number] Arrival date & time: 10/17/16  3086     History   Chief Complaint Chief Complaint  Patient presents with  . Back Pain    HPI Daisy Salinas is a 56 y.o. female.  HPI  56 year old female with a history of HIV, bipolar disorder and cocaine abuse presents with acute low back pain. She states it started spontaneously about one hour prior to arrival. She is going to go lay down in bed and all of a sudden felt like her low back spasmed and was very tight. It is diffuse across her low back. No injuries. She does endorse using cocaine recently but no injectable drugs. No fevers, abdominal pain, urinary symptoms, bowel/bladder incontinence, or weakness/numbness in her lower stomach. The pain was so bad she could get off the bed. It has gradually improved despite no treatments by EMS en route. She vomited when being picked up by EMS, no nausea/vomiting since.  Past Medical History:  Diagnosis Date  . Asthma   . Bipolar 1 disorder (HCC) 2009  . Depression   . GERD (gastroesophageal reflux disease)   . HIV (human immunodeficiency virus infection) (HCC)   . Hypertension   . TB (pulmonary tuberculosis) 1989   was exposed and treated  . UTI (urinary tract infection) 1997    Patient Active Problem List   Diagnosis Date Noted  . Cocaine abuse with cocaine-induced mood disorder (HCC) 07/23/2016  . DJD (degenerative joint disease) 03/12/2016  . Right hip pain 08/08/2015  . SUBSTANCE ABUSE, MULTIPLE 11/30/2009  . BRONCHITIS, CHRONIC 01/03/2009  . CONSTIPATION, CHRONIC 05/09/2008  . DE QUERVAIN'S TENOSYNOVITIS, LEFT WRIST 05/09/2008  . Chronic hepatitis C without hepatic coma (HCC) 09/08/2007  . HYSTERECTOMY, HX OF 05/13/2006  . Depression 05/06/2006  . Human immunodeficiency virus (HIV) disease (HCC) 10/29/2005  . SYPHILIS, LATE, LATENT 10/29/2005  . DYSLIPIDEMIA 10/29/2005  . CIGARETTE SMOKER 10/29/2005  . HYPERTENSION 10/29/2005    . PROLAPSE, VAGINAL WALL NOS 10/29/2005  . MENORRHAGIA 10/29/2005  . INSOMNIA 10/29/2005    Past Surgical History:  Procedure Laterality Date  . ABDOMINAL HYSTERECTOMY      OB History    No data available       Home Medications    Prior to Admission medications   Medication Sig Start Date End Date Taking? Authorizing Provider  divalproex (DEPAKOTE ER) 500 MG 24 hr tablet Take 500-1,000 mg by mouth 2 (two) times daily. Pt takes one tablet in the morning and two at bedtime.   Yes [provider]  GENVOYA 150-150-200-10 MG TABS tablet TAKE 1 TABLET BY MOUTH DAILY WITH BREAKFAST Patient taking differently: Take 1 tablet by mouth at bedtime.  04/24/16  Yes Cliffton Asters, MD  hydrochlorothiazide (HYDRODIURIL) 25 MG tablet TAKE 1 TABLET(25 MG) BY MOUTH DAILY 04/24/16  Yes Cliffton Asters, MD  lisinopril (PRINIVIL,ZESTRIL) 20 MG tablet TAKE 1 TABLET(20 MG) BY MOUTH DAILY 04/24/16  Yes Cliffton Asters, MD  QUEtiapine (SEROQUEL) 100 MG tablet Take 150-300 mg by mouth See admin instructions. Pt takes 150 mg in the morning and 300 mg in the evening   Yes [provider]  sertraline (ZOLOFT) 50 MG tablet Take 50 mg by mouth daily.   Yes [provider]  zolpidem (AMBIEN) 5 MG tablet Take 5 mg by mouth at bedtime as needed for sleep.    Yes [provider]  cyclobenzaprine (FLEXERIL) 10 MG tablet Take 1 tablet (10 mg total) by mouth 3 (three)  times daily as needed for muscle spasms. 10/17/16   Pricilla Loveless, MD    Family History Family History  Problem Relation Age of Onset  . Coronary artery disease Mother   . Leukemia Father     Social History Social History  Substance Use Topics  . Smoking status: Current Every Day Smoker    Packs/day: 0.50    Years: 40.00    Types: Cigarettes  . Smokeless tobacco: Never Used     Comment: wanting "some pills" to help her stop  . Alcohol use 38.4 oz/week    64 Standard drinks or equivalent per week      Allergies   Patient has no known allergies.   Review of Systems Review of Systems  Constitutional: Negative for fever.  Gastrointestinal: Positive for vomiting. Negative for abdominal pain.  Genitourinary: Negative for dysuria and hematuria.  Musculoskeletal: Positive for back pain.  Neurological: Negative for weakness and numbness.  All other systems reviewed and are negative.    Physical Exam Updated Vital Signs BP 112/60   Pulse 80   Temp 98.8 F (37.1 C) (Oral)   Resp 12   Ht  (1.499 m)   Wt 67.1 kg (148 lb)   SpO2 96%   BMI 29.89 kg/m   Physical Exam  Constitutional: She is oriented to person, place, and time. She appears well-developed and well-nourished. No distress.  HENT:  Head: Normocephalic and atraumatic.  Right Ear: External ear normal.  Left Ear: External ear normal.  Nose: Nose normal.  Eyes: Right eye exhibits no discharge. Left eye exhibits no discharge.  Cardiovascular: Normal rate, regular rhythm and normal heart sounds.   Pulmonary/Chest: Effort normal and breath sounds normal.  Abdominal: Soft. There is no tenderness. There is no CVA tenderness.  Musculoskeletal:       Lumbar back: She exhibits tenderness.  Neurological: She is alert and oriented to person, place, and time.  5/5 strength in BLE. Normal gross sensation  Skin: Skin is warm and dry. She is not diaphoretic.  Nursing note and vitals reviewed.    ED Treatments / Results  Labs (all labs ordered are listed, but only abnormal results are displayed) Labs Reviewed  URINALYSIS, ROUTINE W REFLEX MICROSCOPIC - Abnormal; Notable for the following:       Result Value   Color, Urine STRAW (*)    All other components within normal limits    EKG  EKG Interpretation None       Radiology Dg Lumbar Spine Complete  Result Date: 10/17/2016 CLINICAL DATA:  Low back spasms 1 day.  No injury. EXAM: LUMBAR SPINE - COMPLETE 4+ VIEW COMPARISON:  None. FINDINGS: Subtle curvature  of the lumbar spine convex to the right. Vertebral body heights and disc spaces are within normal. There is mild spondylosis of the lumbar spine to include moderate facet arthropathy over the lower lumbar spine. No compression fracture or spondylolisthesis. IMPRESSION: No acute findings. Mild spondylosis of the lumbar spine with moderate facet arthropathy over the lower lumbar spine. Electronically Signed   By: Elberta Fortis M.D.   On: 10/17/2016 20:14    Procedures Procedures (including critical care time)  Medications Ordered in ED Medications  HYDROmorphone (DILAUDID) injection 2 mg (2 mg Intramuscular Given 10/17/16 1939)  cyclobenzaprine (FLEXERIL) tablet 10 mg (10 mg Oral Given 10/17/16 2053)  ibuprofen (ADVIL,MOTRIN) tablet 600 mg (600 mg Oral Given 10/17/16 2053)     Initial Impression / Assessment and Plan / ED Course  I have  reviewed the triage vital signs and the nursing notes.  Pertinent labs & imaging results that were available during my care of the patient were reviewed by me and considered in my medical decision making (see chart for details).     Patient's pain is improved and she is able to ambulate around the ED without difficulty after IM Dilaudid. There was no trauma and no abdominal symptoms. I think this is likely muscular pain/spasms. Highly doubt spinal cord emergency. While the patient has HIV, she has no fever or other concerning symptoms. No weakness/numbness or incontinence. Will treat with muscle relaxer and Tylenol/ibuprofen. Discharge home with return precautions.  Final Clinical Impressions(s) / ED Diagnoses   Final diagnoses:  Acute bilateral low back pain without sciatica    New Prescriptions Discharge Medication List as of 10/17/2016  8:44 PM    START taking these medications   Details  cyclobenzaprine (FLEXERIL) 10 MG tablet Take 1 tablet (10 mg total) by mouth 3 (three) times daily as needed for muscle spasms., Starting Thu 10/17/2016, Print          Pricilla Loveless, MD 10/17/16 [phone]

## 2016-10-17 NOTE — ED Notes (Signed)
Patient transported to X-ray 

## 2016-10-17 NOTE — ED Triage Notes (Signed)
Pt brought in by [facility] c/o back spasms. Pt had one episode of emesis. Given  zofran ODT. CBG 114. Pt stated she has hx of back spasms "but they have never been this bad". Pt states unable to walk.

## 2016-10-24 ENCOUNTER — Encounter: Payer: Self-pay | Admitting: Internal Medicine

## 2016-10-24 ENCOUNTER — Encounter (INDEPENDENT_AMBULATORY_CARE_PROVIDER_SITE_OTHER): Payer: Self-pay | Admitting: *Deleted

## 2016-10-24 ENCOUNTER — Ambulatory Visit (INDEPENDENT_AMBULATORY_CARE_PROVIDER_SITE_OTHER): Payer: Self-pay | Admitting: Internal Medicine

## 2016-10-24 VITALS — BP 112/74 | HR 73 | Temp 98.1°F | Wt 157.8 lb

## 2016-10-24 DIAGNOSIS — Z006 Encounter for examination for normal comparison and control in clinical research program: Secondary | ICD-10-CM

## 2016-10-24 DIAGNOSIS — F325 Major depressive disorder, single episode, in full remission: Secondary | ICD-10-CM

## 2016-10-24 DIAGNOSIS — F1414 Cocaine abuse with cocaine-induced mood disorder: Secondary | ICD-10-CM

## 2016-10-24 DIAGNOSIS — F17200 Nicotine dependence, unspecified, uncomplicated: Secondary | ICD-10-CM

## 2016-10-24 DIAGNOSIS — B20 Human immunodeficiency virus [HIV] disease: Secondary | ICD-10-CM

## 2016-10-24 DIAGNOSIS — Z23 Encounter for immunization: Secondary | ICD-10-CM

## 2016-10-24 NOTE — Assessment & Plan Note (Signed)
She is very remorseful about her relapse of cocaine use. She is motivated to not use anymore.

## 2016-10-24 NOTE — Progress Notes (Signed)
Patient Active Problem List   Diagnosis Date Noted  . Chronic hepatitis C without hepatic coma (HCC) 09/08/2007    Priority: High  . Human immunodeficiency virus (HIV) disease (HCC) 10/29/2005    Priority: High  . Cocaine abuse with cocaine-induced mood disorder (HCC) 07/23/2016  . DJD (degenerative joint disease) 03/12/2016  . Right hip pain 08/08/2015  . SUBSTANCE ABUSE, MULTIPLE 11/30/2009  . BRONCHITIS, CHRONIC 01/03/2009  . CONSTIPATION, CHRONIC 05/09/2008  . DE QUERVAIN'S TENOSYNOVITIS, LEFT WRIST 05/09/2008  . HYSTERECTOMY, HX OF 05/13/2006  . Depression 05/06/2006  . SYPHILIS, LATE, LATENT 10/29/2005  . DYSLIPIDEMIA 10/29/2005  . CIGARETTE SMOKER 10/29/2005  . HYPERTENSION 10/29/2005  . PROLAPSE, VAGINAL WALL NOS 10/29/2005  . MENORRHAGIA 10/29/2005  . INSOMNIA 10/29/2005    Patient's Medications  New Prescriptions   No medications on file  Previous Medications   CYCLOBENZAPRINE (FLEXERIL) 10 MG TABLET    Take 1 tablet (10 mg total) by mouth 3 (three) times daily as needed for muscle spasms.   DIVALPROEX (DEPAKOTE ER) 500 MG 24 HR TABLET    Take 500-1,000 mg by mouth 2 (two) times daily. Pt takes one tablet in the morning and two at bedtime.   GENVOYA 150-150-200-10 MG TABS TABLET    TAKE 1 TABLET BY MOUTH DAILY WITH BREAKFAST   HYDROCHLOROTHIAZIDE (HYDRODIURIL) 25 MG TABLET    TAKE 1 TABLET(25 MG) BY MOUTH DAILY   LISINOPRIL (PRINIVIL,ZESTRIL) 20 MG TABLET    TAKE 1 TABLET(20 MG) BY MOUTH DAILY   QUETIAPINE (SEROQUEL) 100 MG TABLET    Take 150-300 mg by mouth See admin instructions. Pt takes 150 mg in the morning and 300 mg in the evening   SERTRALINE (ZOLOFT) 50 MG TABLET    Take 50 mg by mouth daily.   ZOLPIDEM (AMBIEN) 5 MG TABLET    Take 5 mg by mouth at bedtime as needed for sleep.   Modified Medications   No medications on file  Discontinued Medications   No medications on file    Subjective: Daisy Salinas is in for her routine HIV follow-up visit.  She has had no problems obtaining, taking or tolerating her Genvoya and does not recall missing any doses. She continues to struggle with chronic back pain. She had one relapse with cocaine use in July when her husband, Daisy Salinas, was out of town. She denies any use since that time. She is very happy to tell me that she quit smoking cigarettes 2 weeks ago.  Review of Systems: Review of Systems  Constitutional: Negative for chills, diaphoresis, fever, malaise/fatigue and weight loss.  HENT: Negative for sore throat.   Respiratory: Negative for cough, sputum production and shortness of breath.   Cardiovascular: Negative for chest pain.  Gastrointestinal: Negative for abdominal pain, diarrhea, heartburn, nausea and vomiting.  Genitourinary: Negative for dysuria and frequency.  Musculoskeletal: Positive for back pain. Negative for joint pain and myalgias.  Skin: Negative for rash.  Neurological: Negative for dizziness and headaches.  Psychiatric/Behavioral: Positive for substance abuse. Negative for depression. The patient is not nervous/anxious.     Past Medical History:  Diagnosis Date  . Asthma   . Bipolar 1 disorder (HCC) 2009  . Depression   . GERD (gastroesophageal reflux disease)   . HIV (human immunodeficiency virus infection) (HCC)   . Hypertension   . TB (pulmonary tuberculosis) 1989   was exposed and treated  . UTI (urinary tract infection) 1997    Social History  Substance Use Topics  . Smoking status: Current Every Day Smoker    Packs/day: 0.50    Years: 40.00    Types: Cigarettes  . Smokeless tobacco: Never Used     Comment: wanting "some pills" to help her stop  . Alcohol use 38.4 oz/week    64 Standard drinks or equivalent per week     Comment: occ    Family History  Problem Relation Age of Onset  . Coronary artery disease Mother   . Leukemia Father     No Known Allergies  Health Maintenance  Topic Date Due  . TETANUS/TDAP  06/12/1978  . PAP SMEAR   03/24/2009  . MAMMOGRAM  06/11/2009  . INFLUENZA VACCINE  08/21/2016  . COLONOSCOPY  06/23/2018  . Hepatitis C Screening  Completed  . HIV Screening  Completed    Objective:  Vitals:   10/24/16 1013  BP: 123/84  Pulse: 64  Temp: 98.2 F (36.8 C)  TempSrc: Oral  Weight: 157 lb (71.2 kg)   Body mass index is 31.71 kg/m.  Physical Exam  Constitutional: She is oriented to person, place, and time.  She is very happy today. She is accompanied by her husband and her grandson.  HENT:  Mouth/Throat: No oropharyngeal exudate.  Eyes: Conjunctivae are normal.  Cardiovascular: Normal rate and regular rhythm.   No murmur heard. Pulmonary/Chest: Effort normal and breath sounds normal. She has no wheezes. She has no rales.  Abdominal: Soft. She exhibits no mass. There is no tenderness.  Musculoskeletal: Normal range of motion.  Neurological: She is alert and oriented to person, place, and time. Gait normal.  Skin: No rash noted.  Psychiatric: Mood and affect normal.    Lab Results Lab Results  Component Value Date   WBC 6.0 07/23/2016   HGB 13.0 07/23/2016   HCT 36.6 07/23/2016   MCV 95.3 07/23/2016   PLT 109 (L) 07/23/2016    Lab Results  Component Value Date   CREATININE 0.81 07/23/2016   BUN 10 07/23/2016   NA 138 07/23/2016   K 2.8 (L) 07/23/2016   CL 103 07/23/2016   CO2 26 07/23/2016    Lab Results  Component Value Date   ALT 7 (L) 07/23/2016   AST 24 07/23/2016   ALKPHOS 45 07/23/2016   BILITOT 0.4 07/23/2016    Lab Results  Component Value Date   CHOL 152 02/26/2016   HDL 30 (L) 02/26/2016   LDLCALC 56 02/26/2016   TRIG 331 (H) 02/26/2016   CHOLHDL 5.1 (H) 02/26/2016   Lab Results  Component Value Date   LABRPR NON REAC 02/26/2016   RPRTITER 1:1 07/19/2015   HIV 1 RNA Quant (copies/mL)  Date Value  09/27/2016 <20 DETECTED (A)  02/26/2016 <20 DETECTED (A)  07/19/2015 <20   CD4 T Cell Abs (/uL)  Date Value  09/27/2016 1,630  02/26/2016 1,490    07/19/2015 1,490     Problem List Items Addressed This Visit      High   Human immunodeficiency virus (HIV) disease (HCC)    Her infection is under excellent long-term control. She will continue Genvoya and follow-up after lab work in 6 months.      Relevant Orders   T-helper cell (CD4)- ([facility] clinic only)   HIV 1 RNA quant-no reflex-bld   Comprehensive metabolic panel   CBC   RPR     Unprioritized   CIGARETTE SMOKER    I congratulated her on quitting smoking.  Cocaine abuse with cocaine-induced mood disorder [place])    She is very remorseful about her relapse of cocaine use. She is motivated to not use anymore.      Depression    Her depression is in remission.           Cliffton Asters, MD [facility] [phone] pager   [phone] cell 10/24/2016, 10:49 AM

## 2016-10-24 NOTE — Assessment & Plan Note (Signed)
Her infection is under excellent long-term control. She will continue Genvoya and follow-up after lab work in 6 months.

## 2016-10-24 NOTE — Assessment & Plan Note (Signed)
I congratulated her on quitting smoking. 

## 2016-10-24 NOTE — Progress Notes (Signed)
Daisy Salinas is here for her month 12 visit for Reprieve. She says her adherence is excellent and denies any muscle pain or weakness. She did go to the ED last week for severe back pain which has resolved. I gave her a flushot while here and she will be returning in February for the next study visit.

## 2016-10-24 NOTE — Assessment & Plan Note (Signed)
Her depression is in remission. 

## 2016-10-25 LAB — COMPREHENSIVE METABOLIC PANEL
AG RATIO: 1.2 (calc) (ref 1.0–2.5)
ALBUMIN MSPROF: 4.4 g/dL (ref 3.6–5.1)
ALT: 7 U/L (ref 6–29)
AST: 30 U/L (ref 10–35)
Alkaline phosphatase (APISO): 54 U/L (ref 33–130)
BUN: 12 mg/dL (ref 7–25)
CHLORIDE: 102 mmol/L (ref 98–110)
CO2: 28 mmol/L (ref 20–32)
CREATININE: 0.98 mg/dL (ref 0.50–1.05)
Calcium: 9.6 mg/dL (ref 8.6–10.4)
GLOBULIN: 3.6 g/dL (ref 1.9–3.7)
GLUCOSE: 107 mg/dL — AB (ref 65–99)
POTASSIUM: 3.1 mmol/L — AB (ref 3.5–5.3)
Sodium: 139 mmol/L (ref 135–146)
Total Bilirubin: 0.5 mg/dL (ref 0.2–1.2)
Total Protein: 8 g/dL (ref 6.1–8.1)

## 2016-12-10 ENCOUNTER — Other Ambulatory Visit: Payer: Self-pay | Admitting: Internal Medicine

## 2016-12-10 DIAGNOSIS — B20 Human immunodeficiency virus [HIV] disease: Secondary | ICD-10-CM

## 2016-12-10 DIAGNOSIS — I10 Essential (primary) hypertension: Secondary | ICD-10-CM

## 2017-02-21 ENCOUNTER — Encounter: Payer: Self-pay | Admitting: *Deleted

## 2017-02-28 ENCOUNTER — Encounter (INDEPENDENT_AMBULATORY_CARE_PROVIDER_SITE_OTHER): Payer: Self-pay | Admitting: *Deleted

## 2017-02-28 VITALS — BP 142/86 | HR 64 | Temp 98.6°F | Wt 154.5 lb

## 2017-02-28 DIAGNOSIS — Z006 Encounter for examination for normal comparison and control in clinical research program: Secondary | ICD-10-CM

## 2017-02-28 NOTE — Progress Notes (Signed)
Daisy Salinas is here for her month 16 visit for Reprieve. She denies any new problems or medications. She has joined the [employer] and she says it is helping her stay on track as far as no drugs, no alcohol, etc. And she likes it there. She will be returning in June for the next visit.

## 2017-04-10 ENCOUNTER — Other Ambulatory Visit: Payer: Self-pay

## 2017-04-24 ENCOUNTER — Ambulatory Visit: Payer: Self-pay | Admitting: Internal Medicine

## 2017-05-08 ENCOUNTER — Other Ambulatory Visit: Payer: Self-pay

## 2017-05-08 ENCOUNTER — Ambulatory Visit: Payer: Self-pay | Admitting: Internal Medicine

## 2017-05-08 DIAGNOSIS — B20 Human immunodeficiency virus [HIV] disease: Secondary | ICD-10-CM

## 2017-05-09 LAB — COMPREHENSIVE METABOLIC PANEL
AG Ratio: 1.3 (calc) (ref 1.0–2.5)
ALKALINE PHOSPHATASE (APISO): 52 U/L (ref 33–130)
ALT: 4 U/L — AB (ref 6–29)
AST: 18 U/L (ref 10–35)
Albumin: 4.4 g/dL (ref 3.6–5.1)
BUN: 14 mg/dL (ref 7–25)
CO2: 27 mmol/L (ref 20–32)
CREATININE: 0.84 mg/dL (ref 0.50–1.05)
Calcium: 9.9 mg/dL (ref 8.6–10.4)
Chloride: 104 mmol/L (ref 98–110)
Globulin: 3.5 g/dL (calc) (ref 1.9–3.7)
Glucose, Bld: 95 mg/dL (ref 65–99)
Potassium: 3.7 mmol/L (ref 3.5–5.3)
Sodium: 141 mmol/L (ref 135–146)
Total Bilirubin: 0.4 mg/dL (ref 0.2–1.2)
Total Protein: 7.9 g/dL (ref 6.1–8.1)

## 2017-05-09 LAB — CBC
HCT: 33.5 % — ABNORMAL LOW (ref 35.0–45.0)
Hemoglobin: 11.8 g/dL (ref 11.7–15.5)
MCH: 33.6 pg — AB (ref 27.0–33.0)
MCHC: 35.2 g/dL (ref 32.0–36.0)
MCV: 95.4 fL (ref 80.0–100.0)
MPV: 12 fL (ref 7.5–12.5)
PLATELETS: 136 10*3/uL — AB (ref 140–400)
RBC: 3.51 10*6/uL — ABNORMAL LOW (ref 3.80–5.10)
RDW: 14 % (ref 11.0–15.0)
WBC: 6.1 10*3/uL (ref 3.8–10.8)

## 2017-05-09 LAB — RPR: RPR: NONREACTIVE

## 2017-05-09 LAB — T-HELPER CELL (CD4) - (RCID CLINIC ONLY)
CD4 % Helper T Cell: 39 % (ref 33–55)
CD4 T Cell Abs: 1200 /uL (ref 400–2700)

## 2017-05-13 LAB — HIV-1 RNA QUANT-NO REFLEX-BLD
HIV 1 RNA Quant: 26 copies/mL — ABNORMAL HIGH
HIV-1 RNA Quant, Log: 1.41 Log copies/mL — ABNORMAL HIGH

## 2017-05-22 ENCOUNTER — Ambulatory Visit (INDEPENDENT_AMBULATORY_CARE_PROVIDER_SITE_OTHER): Payer: Self-pay | Admitting: Internal Medicine

## 2017-05-22 ENCOUNTER — Encounter: Payer: Self-pay | Admitting: Internal Medicine

## 2017-05-22 DIAGNOSIS — B182 Chronic viral hepatitis C: Secondary | ICD-10-CM

## 2017-05-22 DIAGNOSIS — B20 Human immunodeficiency virus [HIV] disease: Secondary | ICD-10-CM

## 2017-05-22 NOTE — Assessment & Plan Note (Signed)
Her infection remains under very good, long-term control.  She will continue Genvoya and follow-up after lab work in 6 months.

## 2017-05-22 NOTE — Progress Notes (Signed)
Patient Active Problem List   Diagnosis Date Noted  . Chronic hepatitis C without hepatic coma (HCC) 09/08/2007    Priority: High  . Human immunodeficiency virus (HIV) disease (HCC) 10/29/2005    Priority: High  . Cocaine abuse with cocaine-induced mood disorder (HCC) 07/23/2016  . DJD (degenerative joint disease) 03/12/2016  . Right hip pain 08/08/2015  . SUBSTANCE ABUSE, MULTIPLE 11/30/2009  . BRONCHITIS, CHRONIC 01/03/2009  . CONSTIPATION, CHRONIC 05/09/2008  . DE QUERVAIN'S TENOSYNOVITIS, LEFT WRIST 05/09/2008  . HYSTERECTOMY, HX OF 05/13/2006  . Depression 05/06/2006  . SYPHILIS, LATE, LATENT 10/29/2005  . DYSLIPIDEMIA 10/29/2005  . CIGARETTE SMOKER 10/29/2005  . HYPERTENSION 10/29/2005  . PROLAPSE, VAGINAL WALL NOS 10/29/2005  . MENORRHAGIA 10/29/2005  . INSOMNIA 10/29/2005    Patient's Medications  New Prescriptions   No medications on file  Previous Medications   CYCLOBENZAPRINE (FLEXERIL) 10 MG TABLET    Take 1 tablet (10 mg total) by mouth 3 (three) times daily as needed for muscle spasms.   DIVALPROEX (DEPAKOTE ER) 500 MG 24 HR TABLET    Take 500-1,000 mg by mouth 2 (two) times daily. Pt takes one tablet in the morning and two at bedtime.   GENVOYA 150-150-200-10 MG TABS TABLET    TAKE 1 TABLET BY MOUTH DAILY WITH BREAKFAST   HYDROCHLOROTHIAZIDE (HYDRODIURIL) 25 MG TABLET    TAKE 1 TABLET(25 MG) BY MOUTH DAILY   LISINOPRIL (PRINIVIL,ZESTRIL) 20 MG TABLET    TAKE 1 TABLET(20 MG) BY MOUTH DAILY   QUETIAPINE (SEROQUEL) 100 MG TABLET    Take 150-300 mg by mouth See admin instructions. Pt takes 150 mg in the morning and 300 mg in the evening   SERTRALINE (ZOLOFT) 50 MG TABLET    Take 50 mg by mouth daily.   ZOLPIDEM (AMBIEN) 5 MG TABLET    Take 5 mg by mouth at bedtime as needed for sleep.   Modified Medications   No medications on file  Discontinued Medications   No medications on file    Subjective: Daisy Salinas is in for her routine HIV follow-up visit.   She has had no problems obtaining, taking or tolerating her Genvoya and denies missing doses.  She is still smoking occasional cigarettes and drinks some beer recently.  She denies any recent cocaine use.  She continues to go to [place] for counseling every 2 months.  She is somewhat concerned that she lost her voucher for her psych medicines and might have trouble affording them in the near future.  Review of Systems: Review of Systems  Constitutional: Negative for chills, diaphoresis and fever.  Psychiatric/Behavioral: Positive for depression. Negative for substance abuse.    Past Medical History:  Diagnosis Date  . Asthma   . Bipolar 1 disorder (HCC) 2009  . Depression   . GERD (gastroesophageal reflux disease)   . HIV (human immunodeficiency virus infection) (HCC)   . Hypertension   . TB (pulmonary tuberculosis) 1989   was exposed and treated  . UTI (urinary tract infection) 1997    Social History   Tobacco Use  . Smoking status: Current Every Day Smoker    Packs/day: 0.50    Years: 40.00    Pack years: 20.00    Types: Cigarettes  . Smokeless tobacco: Never Used  . Tobacco comment: wanting "some pills" to help her stop  Substance Use Topics  . Alcohol use: Yes    Alcohol/week: 38.4 oz    Types: 64  Standard drinks or equivalent per week    Comment: occ  . Drug use: Yes    Frequency: 4.0 times per week    Types: "Crack" cocaine, Cocaine    Family History  Problem Relation Age of Onset  . Coronary artery disease Mother   . Leukemia Father     No Known Allergies  Health Maintenance  Topic Date Due  . TETANUS/TDAP  06/12/1978  . PAP SMEAR  03/24/2009  . MAMMOGRAM  06/11/2009  . INFLUENZA VACCINE  08/21/2017  . COLONOSCOPY  06/23/2018  . Hepatitis C Screening  Completed  . HIV Screening  Completed    Objective:  Vitals:   05/22/17 1032  BP: 108/74  Pulse: 75  Temp: 98.5 F (36.9 C)  TempSrc: Oral  Weight: 158 lb (71.7 kg)  Height: 5' (1.524 m)    Body mass index is 30.86 kg/m.  Physical Exam  Constitutional: She is oriented to person, place, and time.  She is pleasant and in no distress.  She is accompanied by her husband and 3 grandchildren.  HENT:  Mouth/Throat: No oropharyngeal exudate.  Eyes: Conjunctivae are normal.  Cardiovascular: Normal rate and regular rhythm.  No murmur heard. Pulmonary/Chest: Effort normal and breath sounds normal.  Abdominal: Soft. She exhibits no mass. There is no tenderness.  Musculoskeletal: Normal range of motion.  Neurological: She is alert and oriented to person, place, and time.  Skin: No rash noted.  Psychiatric: She has a normal mood and affect.    Lab Results Lab Results  Component Value Date   WBC 6.1 05/08/2017   HGB 11.8 05/08/2017   HCT 33.5 (L) 05/08/2017   MCV 95.4 05/08/2017   PLT 136 (L) 05/08/2017    Lab Results  Component Value Date   CREATININE 0.84 05/08/2017   BUN 14 05/08/2017   NA 141 05/08/2017   K 3.7 05/08/2017   CL 104 05/08/2017   CO2 27 05/08/2017    Lab Results  Component Value Date   ALT 4 (L) 05/08/2017   AST 18 05/08/2017   ALKPHOS 45 07/23/2016   BILITOT 0.4 05/08/2017    Lab Results  Component Value Date   CHOL 152 02/26/2016   HDL 30 (L) 02/26/2016   LDLCALC 56 02/26/2016   TRIG 331 (H) 02/26/2016   CHOLHDL 5.1 (H) 02/26/2016   Lab Results  Component Value Date   LABRPR NON-REACTIVE 05/08/2017   RPRTITER 1:1 07/19/2015   HIV 1 RNA Quant (copies/mL)  Date Value  05/08/2017 26 (H)  09/27/2016 <20 DETECTED (A)  02/26/2016 <20 DETECTED (A)   CD4 T Cell Abs (/uL)  Date Value  05/08/2017 1,200  09/27/2016 1,630  02/26/2016 1,490     Problem List Items Addressed This Visit      High   Chronic hepatitis C without hepatic coma (HCC)   Human immunodeficiency virus (HIV) disease (HCC)    Her infection remains under very good, long-term control.  She will continue Genvoya and follow-up after lab work in 6 months.       Relevant Orders   T-helper cell (CD4)- ([facility] clinic only)   HIV 1 RNA quant-no reflex-bld        Cliffton Asters, MD [facility] Disease [facility] [phone] pager   [phone] cell 05/22/2017, 11:04 AM

## 2017-06-05 ENCOUNTER — Encounter: Payer: Self-pay | Admitting: Internal Medicine

## 2017-06-18 ENCOUNTER — Other Ambulatory Visit: Payer: Self-pay | Admitting: Internal Medicine

## 2017-06-18 DIAGNOSIS — I10 Essential (primary) hypertension: Secondary | ICD-10-CM

## 2017-06-27 ENCOUNTER — Encounter (INDEPENDENT_AMBULATORY_CARE_PROVIDER_SITE_OTHER): Payer: Self-pay | Admitting: *Deleted

## 2017-06-27 VITALS — BP 142/89 | HR 65 | Temp 98.5°F | Wt 162.8 lb

## 2017-06-27 DIAGNOSIS — Z006 Encounter for examination for normal comparison and control in clinical research program: Secondary | ICD-10-CM

## 2017-06-27 NOTE — Progress Notes (Signed)
Daisy Salinas is here for her month 20 visit for Reprieve. She says she is doing great and denies any new problems. She says her adherence is excellent and will return in October for the next study visit.

## 2017-08-06 ENCOUNTER — Other Ambulatory Visit: Payer: Self-pay | Admitting: Internal Medicine

## 2017-08-06 DIAGNOSIS — B20 Human immunodeficiency virus [HIV] disease: Secondary | ICD-10-CM

## 2017-08-06 DIAGNOSIS — I10 Essential (primary) hypertension: Secondary | ICD-10-CM

## 2017-08-20 ENCOUNTER — Encounter: Payer: Self-pay | Admitting: *Deleted

## 2017-08-20 ENCOUNTER — Emergency Department (HOSPITAL_COMMUNITY): Payer: Self-pay

## 2017-08-20 ENCOUNTER — Other Ambulatory Visit: Payer: Self-pay

## 2017-08-20 ENCOUNTER — Observation Stay (HOSPITAL_COMMUNITY)
Admission: EM | Admit: 2017-08-20 | Discharge: 2017-08-21 | Disposition: A | Discharge status: Home / Self Care | Payer: Self-pay | Attending: Internal Medicine | Admitting: Internal Medicine

## 2017-08-20 ENCOUNTER — Encounter (HOSPITAL_COMMUNITY): Payer: Self-pay | Admitting: Emergency Medicine

## 2017-08-20 DIAGNOSIS — I959 Hypotension, unspecified: Secondary | ICD-10-CM | POA: Insufficient documentation

## 2017-08-20 DIAGNOSIS — E876 Hypokalemia: Secondary | ICD-10-CM | POA: Diagnosis present

## 2017-08-20 DIAGNOSIS — F319 Bipolar disorder, unspecified: Secondary | ICD-10-CM | POA: Diagnosis present

## 2017-08-20 DIAGNOSIS — F17210 Nicotine dependence, cigarettes, uncomplicated: Secondary | ICD-10-CM | POA: Insufficient documentation

## 2017-08-20 DIAGNOSIS — R55 Syncope and collapse: Principal | ICD-10-CM | POA: Diagnosis present

## 2017-08-20 DIAGNOSIS — I10 Essential (primary) hypertension: Secondary | ICD-10-CM | POA: Diagnosis present

## 2017-08-20 DIAGNOSIS — F1410 Cocaine abuse, uncomplicated: Secondary | ICD-10-CM | POA: Diagnosis present

## 2017-08-20 DIAGNOSIS — B20 Human immunodeficiency virus [HIV] disease: Secondary | ICD-10-CM | POA: Diagnosis present

## 2017-08-20 DIAGNOSIS — F17200 Nicotine dependence, unspecified, uncomplicated: Secondary | ICD-10-CM | POA: Diagnosis present

## 2017-08-20 DIAGNOSIS — Z79899 Other long term (current) drug therapy: Secondary | ICD-10-CM | POA: Insufficient documentation

## 2017-08-20 LAB — URINALYSIS, ROUTINE W REFLEX MICROSCOPIC
Bacteria, UA: NONE SEEN
Bilirubin Urine: NEGATIVE
Glucose, UA: NEGATIVE mg/dL
Ketones, ur: NEGATIVE mg/dL
Leukocytes, UA: NEGATIVE
Nitrite: NEGATIVE
Protein, ur: NEGATIVE mg/dL
SPECIFIC GRAVITY, URINE: 1.006 (ref 1.005–1.030)
pH: 7 (ref 5.0–8.0)

## 2017-08-20 LAB — BASIC METABOLIC PANEL
Anion gap: 11 (ref 5–15)
BUN: 11 mg/dL (ref 6–20)
CO2: 21 mmol/L — AB (ref 22–32)
CREATININE: 0.97 mg/dL (ref 0.44–1.00)
Calcium: 9.1 mg/dL (ref 8.9–10.3)
Chloride: 108 mmol/L (ref 98–111)
GFR calc non Af Amer: >60 mL/min (ref >60)
GLUCOSE: 121 mg/dL — AB (ref 70–99)
Potassium: 2.6 mmol/L — CL (ref 3.5–5.1)
Sodium: 140 mmol/L (ref 135–145)

## 2017-08-20 LAB — RAPID URINE DRUG SCREEN, HOSP PERFORMED
Amphetamines: NOT DETECTED
BARBITURATES: NOT DETECTED
Benzodiazepines: NOT DETECTED
Cocaine: POSITIVE — AB
Opiates: NOT DETECTED
TETRAHYDROCANNABINOL: NOT DETECTED

## 2017-08-20 LAB — HEPATIC FUNCTION PANEL
ALT: 8 U/L (ref 0–44)
AST: 22 U/L (ref 15–41)
Albumin: 3.6 g/dL (ref 3.5–5.0)
Alkaline Phosphatase: 42 U/L (ref 38–126)
Bilirubin, Direct: <0.1 mg/dL (ref 0.0–0.2)
Total Bilirubin: 0.6 mg/dL (ref 0.3–1.2)
Total Protein: 7.2 g/dL (ref 6.5–8.1)

## 2017-08-20 LAB — AMMONIA: Ammonia: 36 umol/L — ABNORMAL HIGH (ref 9–35)

## 2017-08-20 LAB — I-STAT TROPONIN, ED: TROPONIN I, POC: 0 ng/mL (ref 0.00–0.08)

## 2017-08-20 LAB — CBC
HCT: 37.2 % (ref 36.0–46.0)
Hemoglobin: 12.4 g/dL (ref 12.0–15.0)
MCH: 33.9 pg (ref 26.0–34.0)
MCHC: 33.3 g/dL (ref 30.0–36.0)
MCV: 101.6 fL — ABNORMAL HIGH (ref 78.0–100.0)
PLATELETS: 109 10*3/uL — AB (ref 150–400)
RBC: 3.66 MIL/uL — ABNORMAL LOW (ref 3.87–5.11)
RDW: 13.3 % (ref 11.5–15.5)
WBC: 6.5 10*3/uL (ref 4.0–10.5)

## 2017-08-20 LAB — VALPROIC ACID LEVEL: Valproic Acid Lvl: 40 ug/mL — ABNORMAL LOW (ref 50.0–100.0)

## 2017-08-20 LAB — CBG MONITORING, ED: Glucose-Capillary: 112 mg/dL — ABNORMAL HIGH (ref 70–99)

## 2017-08-20 LAB — MAGNESIUM: Magnesium: 2 mg/dL (ref 1.7–2.4)

## 2017-08-20 LAB — TSH: TSH: 1.274 u[IU]/mL (ref 0.350–4.500)

## 2017-08-20 MED ORDER — SODIUM CHLORIDE 0.9 % IV BOLUS
1000.0000 mL | Freq: Once | INTRAVENOUS | Status: AC
  Administered 2017-08-20: 1000 mL via INTRAVENOUS

## 2017-08-20 MED ORDER — ONDANSETRON HCL 4 MG PO TABS: 4.0000 mg | ORAL_TABLET | Freq: Four times a day (QID) | ORAL | Status: DC | PRN

## 2017-08-20 MED ORDER — SERTRALINE HCL 50 MG PO TABS
50.0000 mg | ORAL_TABLET | Freq: Every day | ORAL | Status: DC
  Administered 2017-08-21: 50 mg via ORAL
  Filled 2017-08-20: qty 1

## 2017-08-20 MED ORDER — NICOTINE 14 MG/24HR TD PT24
14.0000 mg | MEDICATED_PATCH | Freq: Every day | TRANSDERMAL | Status: DC
  Administered 2017-08-20 – 2017-08-21 (×2): 14 mg via TRANSDERMAL
  Filled 2017-08-20 (×2): qty 1

## 2017-08-20 MED ORDER — ACETAMINOPHEN 650 MG RE SUPP: 650.0000 mg | Freq: Four times a day (QID) | RECTAL | Status: DC | PRN

## 2017-08-20 MED ORDER — ONDANSETRON HCL 4 MG/2ML IJ SOLN: 4.0000 mg | Freq: Four times a day (QID) | INTRAMUSCULAR | Status: DC | PRN

## 2017-08-20 MED ORDER — POTASSIUM CHLORIDE CRYS ER 20 MEQ PO TBCR
40.0000 meq | EXTENDED_RELEASE_TABLET | Freq: Once | ORAL | Status: AC
  Administered 2017-08-20: 40 meq via ORAL
  Filled 2017-08-20: qty 2

## 2017-08-20 MED ORDER — SODIUM CHLORIDE 0.9% FLUSH
3.0000 mL | Freq: Two times a day (BID) | INTRAVENOUS | Status: DC
  Administered 2017-08-20 (×2): 3 mL via INTRAVENOUS

## 2017-08-20 MED ORDER — ENOXAPARIN SODIUM 40 MG/0.4ML ~~LOC~~ SOLN: 40.0000 mg | SUBCUTANEOUS | Status: DC

## 2017-08-20 MED ORDER — DIVALPROEX SODIUM ER 500 MG PO TB24
1000.0000 mg | ORAL_TABLET | Freq: Every day | ORAL | Status: DC
  Administered 2017-08-20: 1000 mg via ORAL
  Filled 2017-08-20: qty 2

## 2017-08-20 MED ORDER — QUETIAPINE FUMARATE 100 MG PO TABS
150.0000 mg | ORAL_TABLET | Freq: Every day | ORAL | Status: DC
  Administered 2017-08-21: 150 mg via ORAL
  Filled 2017-08-20: qty 3
  Filled 2017-08-20: qty 1.5

## 2017-08-20 MED ORDER — LACTATED RINGERS IV SOLN
INTRAVENOUS | Status: DC
  Administered 2017-08-20 – 2017-08-21 (×2): via INTRAVENOUS

## 2017-08-20 MED ORDER — QUETIAPINE FUMARATE 50 MG PO TABS
300.0000 mg | ORAL_TABLET | Freq: Every day | ORAL | Status: DC
  Administered 2017-08-20: 300 mg via ORAL
  Filled 2017-08-20: qty 6

## 2017-08-20 MED ORDER — ZOLPIDEM TARTRATE 5 MG PO TABS
5.0000 mg | ORAL_TABLET | Freq: Every evening | ORAL | Status: DC | PRN
  Administered 2017-08-20: 5 mg via ORAL
  Filled 2017-08-20: qty 1

## 2017-08-20 MED ORDER — ELVITEG-COBIC-EMTRICIT-TENOFAF 150-150-200-10 MG PO TABS
1.0000 | ORAL_TABLET | Freq: Every day | ORAL | Status: DC
  Administered 2017-08-21: 1 via ORAL
  Filled 2017-08-20: qty 1

## 2017-08-20 MED ORDER — DIVALPROEX SODIUM ER 500 MG PO TB24
500.0000 mg | ORAL_TABLET | Freq: Every day | ORAL | Status: DC
  Administered 2017-08-20 – 2017-08-21 (×2): 500 mg via ORAL
  Filled 2017-08-20 (×2): qty 1

## 2017-08-20 MED ORDER — POTASSIUM CHLORIDE 10 MEQ/100ML IV SOLN
10.0000 meq | INTRAVENOUS | Status: AC
Start: 2017-08-20 — End: 2017-08-20
  Administered 2017-08-20 (×3): 10 meq via INTRAVENOUS
  Filled 2017-08-20 (×3): qty 100

## 2017-08-20 MED ORDER — LISINOPRIL 20 MG PO TABS
20.0000 mg | ORAL_TABLET | Freq: Every day | ORAL | Status: DC
  Administered 2017-08-21: 20 mg via ORAL
  Filled 2017-08-20: qty 1

## 2017-08-20 MED ORDER — ACETAMINOPHEN 325 MG PO TABS: 650.0000 mg | ORAL_TABLET | Freq: Four times a day (QID) | ORAL | Status: DC | PRN

## 2017-08-20 MED ORDER — POTASSIUM CHLORIDE CRYS ER 20 MEQ PO TBCR
40.0000 meq | EXTENDED_RELEASE_TABLET | Freq: Once | ORAL | Status: AC
Start: 2017-08-20 — End: 2017-08-20
  Administered 2017-08-20: 40 meq via ORAL
  Filled 2017-08-20: qty 2

## 2017-08-20 NOTE — ED Notes (Signed)
MD notified of critical potassium

## 2017-08-20 NOTE — ED Provider Notes (Signed)
[facility] EMERGENCY DEPARTMENT Provider Note   CSN: [id number] Arrival date & time: 08/20/17  1001     History   Chief Complaint Chief Complaint  Patient presents with  . Loss of Consciousness    HPI Daisy Salinas is a 57 y.o. female.  The history is provided by the patient, the spouse and medical records.  Loss of Consciousness   This is a new problem. The current episode started less than 1 hour ago. The problem occurs rarely. The problem has been resolved. She lost consciousness for a period of 1 to 5 minutes. The problem is associated with normal activity. Associated symptoms include light-headedness and malaise/fatigue. Pertinent negatives include abdominal pain, back pain, chest pain, confusion, congestion, diaphoresis, dizziness, fever, focal weakness, headaches, nausea, palpitations, seizures, slurred speech, vertigo, vomiting and weakness. She has tried nothing for the symptoms. The treatment provided no relief. Her past medical history is significant for HTN.    Past Medical History:  Diagnosis Date  . Asthma   . Bipolar 1 disorder (HCC) 2009  . Depression   . GERD (gastroesophageal reflux disease)   . HIV (human immunodeficiency virus infection) (HCC)   . Hypertension   . TB (pulmonary tuberculosis) 1989   was exposed and treated  . UTI (urinary tract infection) 1997    Patient Active Problem List   Diagnosis Date Noted  . Cocaine abuse with cocaine-induced mood disorder (HCC) 07/23/2016  . DJD (degenerative joint disease) 03/12/2016  . Right hip pain 08/08/2015  . SUBSTANCE ABUSE, MULTIPLE 11/30/2009  . BRONCHITIS, CHRONIC 01/03/2009  . CONSTIPATION, CHRONIC 05/09/2008  . DE QUERVAIN'S TENOSYNOVITIS, LEFT WRIST 05/09/2008  . Chronic hepatitis C without hepatic coma (HCC) 09/08/2007  . HYSTERECTOMY, HX OF 05/13/2006  . Depression 05/06/2006  . Human immunodeficiency virus (HIV) disease (HCC) 10/29/2005  . SYPHILIS, LATE, LATENT  10/29/2005  . DYSLIPIDEMIA 10/29/2005  . CIGARETTE SMOKER 10/29/2005  . HYPERTENSION 10/29/2005  . PROLAPSE, VAGINAL WALL NOS 10/29/2005  . MENORRHAGIA 10/29/2005  . INSOMNIA 10/29/2005    Past Surgical History:  Procedure Laterality Date  . ABDOMINAL HYSTERECTOMY       OB History   None      Home Medications    Prior to Admission medications   Medication Sig Start Date End Date Taking? Authorizing Provider  cyclobenzaprine (FLEXERIL) 10 MG tablet Take 1 tablet (10 mg total) by mouth 3 (three) times daily as needed for muscle spasms. Patient not taking: Reported on 06/27/2017 10/17/16   Pricilla Loveless, MD  divalproex (DEPAKOTE ER) 500 MG 24 hr tablet Take 500-1,000 mg by mouth 2 (two) times daily. Pt takes one tablet in the morning and two at bedtime.    [provider]  GENVOYA 150-150-200-10 MG TABS tablet TAKE 1 TABLET BY MOUTH DAILY WITH BREAKFAST 08/06/17   Cliffton Asters, MD  hydrochlorothiazide (HYDRODIURIL) 25 MG tablet TAKE 1 TABLET(25 MG) BY MOUTH DAILY 06/18/17   Cliffton Asters, MD  lisinopril (PRINIVIL,ZESTRIL) 20 MG tablet TAKE 1 TABLET(20 MG) BY MOUTH DAILY 08/06/17   Cliffton Asters, MD  QUEtiapine (SEROQUEL) 100 MG tablet Take 150-300 mg by mouth See admin instructions. Pt takes 150 mg in the morning and 300 mg in the evening    [provider]  sertraline (ZOLOFT) 50 MG tablet Take 50 mg by mouth daily.    [provider]  zolpidem (AMBIEN) 5 MG tablet Take 5 mg by mouth at bedtime as needed for sleep.     [provider]    Family History Family History  Problem Relation Age of Onset  . Coronary artery disease Mother   . Leukemia Father     Social History Social History   Tobacco Use  . Smoking status: Current Every Day Smoker    Packs/day: 0.50    Years: 40.00    Pack years: 20.00    Types: Cigarettes  . Smokeless tobacco: Never Used  . Tobacco comment: wanting "some pills" to help her stop  Substance Use Topics  .  Alcohol use: Yes    Alcohol/week: 38.4 oz    Types: 64 Standard drinks or equivalent per week    Comment: occ  . Drug use: Yes    Frequency: 4.0 times per week    Types: "Crack" cocaine, Cocaine     Allergies   Patient has no known allergies.   Review of Systems Review of Systems  Constitutional: Positive for fatigue and malaise/fatigue. Negative for chills, diaphoresis and fever.  HENT: Negative for congestion.   Respiratory: Negative for cough, chest tightness, shortness of breath and wheezing.   Cardiovascular: Positive for syncope. Negative for chest pain and palpitations.  Gastrointestinal: Negative for abdominal pain, nausea and vomiting.  Genitourinary: Negative for flank pain and frequency.  Musculoskeletal: Negative for back pain, neck pain and neck stiffness.  Neurological: Positive for syncope and light-headedness. Negative for dizziness, vertigo, focal weakness, seizures, facial asymmetry, weakness, numbness and headaches.  Psychiatric/Behavioral: Negative for confusion.  All other systems reviewed and are negative.    Physical Exam Updated Vital Signs BP 113/62 (BP Location: Left Arm)   Pulse (!) 58   Temp 97.8 F (36.6 C) (Oral)   Resp 18   SpO2 97%   Physical Exam  Constitutional: She is oriented to person, place, and time. She appears well-developed and well-nourished. No distress.  HENT:  Head: Normocephalic and atraumatic.  Mouth/Throat: Oropharynx is clear and moist. No oropharyngeal exudate.  Eyes: Conjunctivae are normal.  Neck: Neck supple.  Cardiovascular: Regular rhythm. Bradycardia present.  No murmur heard. Pulmonary/Chest: Effort normal and breath sounds normal. No respiratory distress. She has no wheezes. She has no rales. She exhibits no tenderness.  Abdominal: Soft. There is no tenderness.  Musculoskeletal: She exhibits no edema or tenderness.  Neurological: She is alert and oriented to person, place, and time. No sensory deficit. She  exhibits normal muscle tone.  Skin: Skin is warm and dry. Capillary refill takes less than 2 seconds. No rash noted. She is not diaphoretic. No erythema.  Psychiatric: She has a normal mood and affect.  Nursing note and vitals reviewed.    ED Treatments / Results  Labs (all labs ordered are listed, but only abnormal results are displayed) Labs Reviewed  BASIC METABOLIC PANEL - Abnormal; Notable for the following components:      Result Value   Potassium 2.6 (*)    CO2 21 (*)    Glucose, Bld 121 (*)    All other components within normal limits  CBC - Abnormal; Notable for the following components:   RBC 3.66 (*)    MCV 101.6 (*)    Platelets 109 (*)    All other components within normal limits  URINALYSIS, ROUTINE W REFLEX MICROSCOPIC - Abnormal; Notable for the following components:   Color, Urine STRAW (*)    Hgb urine dipstick SMALL (*)    All other components within normal limits  AMMONIA - Abnormal; Notable for the following components:   Ammonia 36 (*)  All other components within normal limits  CBG MONITORING, ED - Abnormal; Notable for the following components:   Glucose-Capillary 112 (*)    All other components within normal limits  URINE CULTURE  HEPATIC FUNCTION PANEL  MAGNESIUM  VALPROIC ACID LEVEL  TSH  RAPID URINE DRUG SCREEN, HOSP PERFORMED  I-STAT TROPONIN, ED    EKG EKG Interpretation  Date/Time:  Wednesday August 20 2017 10:13:14 EDT Ventricular Rate:  58 PR Interval:  150 QRS Duration: 80 QT Interval:  474 QTC Calculation: 465 R Axis:   70 Text Interpretation:  Sinus bradycardia T wave abnormality, consider anterolateral ischemia Prolonged QT Abnormal ECG When cmpared to prior, new t wave inversions in leads V2-V6.  No STEMI Confirmed by Theda Belfast ([id number]) on 08/20/2017 11:02:25 AM   Radiology Dg Chest 2 View  Result Date: 08/20/2017 CLINICAL DATA:  Syncope. EXAM: CHEST - 2 VIEW COMPARISON:  02/20/2016 FINDINGS: The cardiomediastinal  silhouette is unremarkable. There is no evidence of focal airspace disease, pulmonary edema, suspicious pulmonary nodule/mass, pleural effusion, or pneumothorax. No acute bony abnormalities are identified. IMPRESSION: No active cardiopulmonary disease. Electronically Signed   By: Harmon Pier M.D.   On: 08/20/2017 12:13    Procedures Procedures (including critical care time)  CRITICAL CARE Performed by: Canary Brim Tegeler Total critical care time: 35 minutes Critical care time was exclusive of separately billable procedures and treating other patients. Critical care was necessary to treat or prevent imminent or life-threatening deterioration. Critical care was time spent personally by me on the following activities: development of treatment plan with patient and/or surrogate as well as nursing, discussions with consultants, evaluation of patient's response to treatment, examination of patient, obtaining history from patient or surrogate, ordering and performing treatments and interventions, ordering and review of laboratory studies, ordering and review of radiographic studies, pulse oximetry and re-evaluation of patient's condition.   Medications Ordered in ED Medications  divalproex (DEPAKOTE ER) 24 hr tablet 500-1,000 mg (has no administration in time range)  elvitegravir-cobicistat-emtricitabine-tenofovir (GENVOYA) 150-150-200-10 MG tablet 1 tablet (has no administration in time range)  lisinopril (PRINIVIL,ZESTRIL) tablet 20 mg (has no administration in time range)  QUEtiapine (SEROQUEL) tablet 150-300 mg (has no administration in time range)  sertraline (ZOLOFT) tablet 50 mg (has no administration in time range)  zolpidem (AMBIEN) tablet 5 mg (has no administration in time range)  enoxaparin (LOVENOX) injection 40 mg (has no administration in time range)  sodium chloride flush (NS) 0.9 % injection 3 mL (has no administration in time range)  lactated ringers infusion (has no  administration in time range)  acetaminophen (TYLENOL) tablet 650 mg (has no administration in time range)    Or  acetaminophen (TYLENOL) suppository 650 mg (has no administration in time range)  ondansetron (ZOFRAN) tablet 4 mg (has no administration in time range)    Or  ondansetron (ZOFRAN) injection 4 mg (has no administration in time range)  potassium chloride SA (K-DUR,KLOR-CON) CR tablet 40 mEq (has no administration in time range)  sodium chloride 0.9 % bolus 1,000 mL (0 mLs Intravenous Stopped 08/20/17 1248)  potassium chloride 10 mEq in 100 mL IVPB (0 mEq Intravenous Stopped 08/20/17 1445)  potassium chloride SA (K-DUR,KLOR-CON) CR tablet 40 mEq (40 mEq Oral Given 08/20/17 1146)     Initial Impression / Assessment and Plan / ED Course  I have reviewed the triage vital signs and the nursing notes.  Pertinent labs & imaging results that were available during my care of the patient were reviewed  by me and considered in my medical decision making (see chart for details).     Daisy Salinas is a 57 y.o. female with past medical history significant for HIV, hepatitis C, dyslipidemia, hypertension, polysubstance abuse,  Asthma, and GERD who presents with syncopal episode and hypotension.  Patient reports that she was feeling somewhat lightheaded this morning when she woke up and then went out to the car to see her brother.  She reports that when she sat in the car, she began feeling nauseous and then passed out.  She reports she was out for several minutes.  She reports she does not remember any other preceding symptoms such as chest pain, palpitations or shortness of breath.  She denies recent fevers, chills, nausea, vomiting, cons vision, or diarrhea.  She does report she had a cough for the last few days that is been nonproductive.  No hemoptysis.  She denies any recent leg pain, leg swelling, or peripheral edema.  She denies any medication changes and denies recent drug use.  She reports  she has been compliant with her HIV medications and denies any other problems on arrival.    On exam, patient was found to have clear lungs.  Chest is nontender.  No murmur.  Patient had symmetric pulses in upper and lower 70s.  No lower extremity edema was seen.  Patient was bradycardic on my initial evaluation and in triage, patient's blood pressure was in the 70s systolic.  Patient received fluids with EMS for similar hypotension.  Patient reports she had a history of syncope in the past but was unsure the etiology previously.  EKG showed no evidence of STEMI but did show T wave inversions that were new.  Clinically I am concerned about the patient having a syncopal episode in the setting of hypotension and new T wave inversions.    Patient will screen laboratory testing to look for occult infection or possible pneumonia with a productive cough.  Patient was given fluids to improve her blood pressure.  Anticipate admission for high risk syncope as well as the T wave inversions.    Patient's potassium returned at 2.6.  Am concerned it may be related to her symptoms.  Patient will be given both IV and oral potassium and reassess.  Next  Anticipate admission.  Patient's laboratory testing began to return otherwise.  Initial troponin was negative.  Urinalysis did not show UTI.  Hepatic function was normal.  Ammonia slightly elevated however magnesium was normal.  CBC reassuring.  Kidney function was normal despite low potassium.  Chest x-ray reveals no active cardia pulmonary disease.    Clinically I am concerned about the patient's persistent bradycardia, hypotension on arrival, and her hypokalemia in the 2 range.    Hospital stay will be called for admission for further monitoring of high risk syncope.   Final Clinical Impressions(s) / ED Diagnoses   Final diagnoses:  Syncope, unspecified syncope type  Hypokalemia  Hypotension, unspecified hypotension type    ED Discharge Orders     None      Clinical Impression: 1. Syncope, unspecified syncope type   2. Hypokalemia   3. Hypotension, unspecified hypotension type     Disposition: Admit  This note was prepared with assistance of Dragon voice recognition software. Occasional wrong-word or sound-a-like substitutions may have occurred due to the inherent limitations of voice recognition software.     Tegeler, Canary Brim, MD 08/20/17 [phone]

## 2017-08-20 NOTE — ED Notes (Signed)
Pt. Placed on purewick.  

## 2017-08-20 NOTE — ED Triage Notes (Addendum)
Patient from home with [facility] after two minute syncopal episode. Patient states she sometimes has syncopal episodes after taking her seroquel, normally takes it at night and in the morning but only took last night's dose. Initial BP with EMS 72 palpated, received 400 mL saline, BP 109/72. 20g saline lock in right forearm. Patient oriented but lethargic.

## 2017-08-20 NOTE — H&P (Signed)
History and Physical    Daisy Salinas ZOX:[id number] DOB: [birth year] DOA: 08/20/2017  PCP: Patient, No Pcp Per Consultants:  Orvan Falconer - ID Patient coming from:  Home - lives with husband; NOK: Husband, [phone]  Chief Complaint: syncope  HPI: Daisy Salinas is a 57 y.o. female with medical history significant of HIV; bipolar d/o; hepatitis C; polysubstance abuse; and HTN presenting with syncope. "I passed out."  She got up this AM and her brother came over.  She went outside and was standing beside the car talking to him.  He  Had a sandwich and she went to take a bite.  She got lightheaded and sat down in the backseat.  Next thing she knew the ambulance was there.  She was soaking wet.  She thought she felt ok when she got up this AM.  Her brother thought she was unconscious for 3-4 minutes.  When she came to, she felt "weird" - lightheaded, "like outside of my body".  She was totally back to normal about 5 minutes after the ambulance was there.  She has not been eating/drinking normally, doesn't eat proper meals.  She has been outside in the heat recently.  She does have working [facility] at home.  She does feel like she might have dehydrated the last days, not voiding as usual - her bladder feels full but she hasn't been able to fully empty her bladder.  No diarrhea or vomiting.  She does have a h/o syncope in the past, and she thinks it is related to Seroquel and not getting enough food in her body at night.   ED Course:  Patient with high-risk syncope - hypotension, bradycardia, low K+.  She went out this AM, passed out 2 minutes, ?aura beforehand, light-headed when she woke up.  Otherwise, unremarkable for symptoms.  BP in 70s, improving with IVF.  Evaluation generally negative other than sinus bradycardia.  Given IV and PO KCl.  No head injury.  Review of Systems: As per HPI; otherwise review of systems reviewed and negative.   Ambulatory Status:  Ambulates without assistance  Past  Medical History:  Diagnosis Date  . Asthma   . Bipolar 1 disorder (HCC) 2009  . Depression   . GERD (gastroesophageal reflux disease)   . HIV (human immunodeficiency virus infection) (HCC)   . Hypertension   . TB (pulmonary tuberculosis) 1989   was exposed and treated  . UTI (urinary tract infection) 1997    Past Surgical History:  Procedure Laterality Date  . ABDOMINAL HYSTERECTOMY      Social History   Socioeconomic History  . Marital status: Married    Spouse name: Not on file  . Number of children: Not on file  . Years of education: Not on file  . Highest education level: Not on file  Occupational History  . Occupation: unemployed  Social Needs  . Financial resource strain: Not on file  . Food insecurity:    Worry: Not on file    Inability: Not on file  . Transportation needs:    Medical: Not on file    Non-medical: Not on file  Tobacco Use  . Smoking status: Current Every Day Smoker    Packs/day: 0.50    Years: 45.00    Pack years: 22.50    Types: Cigarettes  . Smokeless tobacco: Never Used  . Tobacco comment: wanting "some cinnamon" to help her stop  Substance and Sexual Activity  . Alcohol use: Yes    Alcohol/week: 39.0 oz  Types: 64 Standard drinks or equivalent, 1 Cans of beer per week    Comment: occ  . Drug use: Yes    Frequency: 4.0 times per week    Types: "Crack" cocaine, Cocaine    Comment: last used last night about 930pm  . Sexual activity: Not Currently    Partners: Male    Comment: declined condoms  Lifestyle  . Physical activity:    Days per week: Not on file    Minutes per session: Not on file  . Stress: Not on file  Relationships  . Social connections:    Talks on phone: Not on file    Gets together: Not on file    Attends religious service: Not on file    Active member of club or organization: Not on file    Attends meetings of clubs or organizations: Not on file    Relationship status: Not on file  . Intimate partner  violence:    Fear of current or ex partner: Not on file    Emotionally abused: Not on file    Physically abused: Not on file    Forced sexual activity: Not on file  Other Topics Concern  . Not on file  Social History Narrative  . Not on file    No Known Allergies  Family History  Problem Relation Age of Onset  . Coronary artery disease Mother   . Leukemia Father     Prior to Admission medications   Medication Sig Start Date End Date Taking? Authorizing Provider  divalproex (DEPAKOTE ER) 500 MG 24 hr tablet Take 500-1,000 mg by mouth 2 (two) times daily. Pt takes one tablet in the morning and two at bedtime.   Yes [provider]  GENVOYA 150-150-200-10 MG TABS tablet TAKE 1 TABLET BY MOUTH DAILY WITH BREAKFAST 08/06/17  Yes Cliffton Astersampbell, John, MD  hydrochlorothiazide (HYDRODIURIL) 25 MG tablet TAKE 1 TABLET(25 MG) BY MOUTH DAILY 06/18/17  Yes Cliffton Astersampbell, John, MD  lisinopril (PRINIVIL,ZESTRIL) 20 MG tablet TAKE 1 TABLET(20 MG) BY MOUTH DAILY 08/06/17  Yes Cliffton Astersampbell, John, MD  QUEtiapine (SEROQUEL) 100 MG tablet Take 150-300 mg by mouth See admin instructions. Pt takes 150 mg in the morning and 300 mg in the evening   Yes [provider]  sertraline (ZOLOFT) 50 MG tablet Take 50 mg by mouth daily.   Yes [provider]  zolpidem (AMBIEN) 5 MG tablet Take 5 mg by mouth at bedtime as needed for sleep.    Yes [provider]  cyclobenzaprine (FLEXERIL) 10 MG tablet Take 1 tablet (10 mg total) by mouth 3 (three) times daily as needed for muscle spasms. Patient not taking: Reported on 06/27/2017 10/17/16   Pricilla LovelessGoldston, Scott, MD    Physical Exam: Vitals:   08/20/17 1100 08/20/17 1130 08/20/17 1415 08/20/17 1500  BP: 133/60 111/60 125/60 133/60  Pulse: 60 (!) 56 (!) 57 (!) 55  Resp: 17 17 20 16   Temp:      TempSrc:      SpO2: 100% 98% 98% 100%     General:  Appears calm and comfortable and is NAD Eyes:  PERRL, EOMI, normal lids, iris ENT:  grossly normal  hearing, lips & tongue, mmm; tongue ring in place Neck:  no LAD, masses or thyromegaly Cardiovascular:  RRR, no m/r/g. No LE edema.  Respiratory:   CTA bilaterally with no wheezes/rales/rhonchi.  Normal respiratory effort. Abdomen:  soft, NT, ND, NABS Skin:  no rash or induration seen on limited exam  Musculoskeletal:  grossly normal tone BUE/BLE, good ROM, no bony abnormality Lower extremity:  No LE edema.  Limited foot exam with no ulcerations.  2+ distal pulses. Psychiatric:  grossly normal mood and affect, speech fluent and appropriate, AOx3 Neurologic:  CN 2-12 grossly intact, moves all extremities in coordinated fashion, sensation intact    Radiological Exams on Admission: Dg Chest 2 View  Result Date: 08/20/2017 CLINICAL DATA:  Syncope. EXAM: CHEST - 2 VIEW COMPARISON:  02/20/2016 FINDINGS: The cardiomediastinal silhouette is unremarkable. There is no evidence of focal airspace disease, pulmonary edema, suspicious pulmonary nodule/mass, pleural effusion, or pneumothorax. No acute bony abnormalities are identified. IMPRESSION: No active cardiopulmonary disease. Electronically Signed   By: Harmon Pier M.D.   On: 08/20/2017 12:13    EKG: Independently reviewed.  Sinus bradycardia with rate 58; new nonspecific ST changes with no evidence of acute ischemia   Labs on Admission: I have personally reviewed the available labs and imaging studies at the time of the admission.  Pertinent labs:   UA: small hgb, otherwise WNL K+ 2.6 CO2 21 Glucose 121 NH 4 36 CMP otherwise normal Platelets 109 CBC otherwise unremarkable +detectable viral load in 4/19  Assessment/Plan Principal Problem:   Syncope Active Problems:   Human immunodeficiency virus (HIV) disease (HCC)   CIGARETTE SMOKER   Cocaine abuse (HCC)   Essential hypertension   Hypokalemia   Bipolar affective disorder (HCC)   Syncope -Patient reports recent decrease in PO intake; significant heat exposure; and cocaine use  last night -Had a single episode of syncope this AM -Found to be hypotensive by EMS, still hypotensive upon arrival in the ER -Hypotension resolved with IVF -By the [facility] syncope rule, the patient is at moderate/high risk for serious outcome and thus should be observed in the hospital. -Will monitor on telemetry -Orthostatic vital signs now and in AM -Troponin negative -Neuro checks  -check TSH -Anticipate d/c to home tomorrow without need for further evaluation if symptoms do not recur and patient has ongoing clinical improvement  Hypokalemia -K+ 2.6 -Normal Mag -Repleted in the ER with 10 mEq IV KCl and 40 mEq PO KCl - this would be expected to raise K+ to 3.1 -Will give an additional 1-time dose of 40 mEq PO KCl -Recheck K+ in AM -Suspect that hypokalemia is related to HCTZ (see below) -Hypokalemia may have contributed to syncope  HTN -She normally takes Lisinopril and HCTZ -Will hold HCTZ, consider discontinuation -BP has normalized, but no medications until tomorrow AM -Resume Lisinopril tomorrow AM -She is not a good beta blocker candidate due to cocaine use  HIV -Followed by Dr. Orvan Falconer -Does not appear to be related to current presentation -Continue Genvoya  Bipolar d/o -Continue home meds: Seroquel, Zoloft, Depakote -Check depakote level  Tobacco dependence -Encourage cessation.  This was discussed with the patient and should be reviewed on an ongoing basis.   -Patient requested "cinnamon" ?candies to help with cessation -Patch ordered instead  Cocaine abuse -Cessation encouraged; this should be encouraged on an ongoing basis -UDS ordered -SW consult   DVT prophylaxis:  Lovenox  Code Status:  DNR - confirmed with patient/family Family Communication: None present  Disposition Plan:  Home once clinically improved Consults called:  SW  Admission status: It is my clinical opinion that referral for OBSERVATION is reasonable and necessary in this  patient based on the above information provided. The aforementioned taken together are felt to place the patient at high risk for further clinical deterioration.  However it is anticipated that the patient may be medically stable for discharge from the hospital within 24 to 48 hours.    Daisy Blue MD [facility]  If note is complete, please contact covering daytime or nighttime physician. [email or web address] Password [email or web address]  08/20/2017, 3:35 PM

## 2017-08-20 NOTE — ED Notes (Signed)
DINNER TRAY ORDERED  

## 2017-08-20 NOTE — ED Notes (Signed)
Attempted report 

## 2017-08-21 ENCOUNTER — Other Ambulatory Visit: Payer: Self-pay

## 2017-08-21 DIAGNOSIS — R55 Syncope and collapse: Secondary | ICD-10-CM

## 2017-08-21 LAB — CBC
HCT: 35 % — ABNORMAL LOW (ref 36.0–46.0)
HEMOGLOBIN: 11.6 g/dL — AB (ref 12.0–15.0)
MCH: 34.2 pg — ABNORMAL HIGH (ref 26.0–34.0)
MCHC: 33.1 g/dL (ref 30.0–36.0)
MCV: 103.2 fL — ABNORMAL HIGH (ref 78.0–100.0)
Platelets: 93 10*3/uL — ABNORMAL LOW (ref 150–400)
RBC: 3.39 MIL/uL — ABNORMAL LOW (ref 3.87–5.11)
RDW: 13.6 % (ref 11.5–15.5)
WBC: 6.5 10*3/uL (ref 4.0–10.5)

## 2017-08-21 LAB — BASIC METABOLIC PANEL
ANION GAP: 7 (ref 5–15)
BUN: 10 mg/dL (ref 6–20)
CALCIUM: 8.9 mg/dL (ref 8.9–10.3)
CO2: 23 mmol/L (ref 22–32)
Chloride: 111 mmol/L (ref 98–111)
Creatinine, Ser: 0.92 mg/dL (ref 0.44–1.00)
GFR calc Af Amer: >60 mL/min (ref >60)
GLUCOSE: 136 mg/dL — AB (ref 70–99)
POTASSIUM: 3.9 mmol/L (ref 3.5–5.1)
SODIUM: 141 mmol/L (ref 135–145)

## 2017-08-21 LAB — URINE CULTURE: Culture: NO GROWTH

## 2017-08-21 MED ORDER — NICOTINE 14 MG/24HR TD PT24: 14.0000 mg | MEDICATED_PATCH | Freq: Every day | TRANSDERMAL | 0 refills | Status: DC

## 2017-08-21 NOTE — Care Management Note (Signed)
Case Management Note  Patient Details  Name: Daisy Salinas MRN: [id number] Date of Birth: May 12, 1960  Subjective/Objective:    Pt in with syncope. She is from home with spouse. Pt without PCP and insurance.                 Action/Plan: CM asked her about attending one of the [facility]. She was interested and CM was able to get [facility]. Information on the AVS. Pt also [facility] on [facility] for assistance with medications she doesn't get through mail order.  Spouse to provide transportation home.   Expected Discharge Date:  08/21/17               Expected Discharge Plan:  Home/Self Care  In-House Referral:     Discharge planning Services  CM Consult, [facility]igent [facility]  Post Acute Care Choice:    Choice offered to:     DME Arranged:    DME Agency:     HH Arranged:    HH Agency:     Status of Service:  Completed, signed off  If discussed at [employer] Meetings, dates discussed:    Additional Comments:  Kermit BaloKelli F Mae Cianci, RN 08/21/2017, 10:28 AM

## 2017-08-21 NOTE — Discharge Summary (Signed)
Physician Discharge Summary  Daisy PartClarina Grey [id number] DOB: [birth year] DOA: 08/20/2017  PCP: Patient, No Pcp Per  Admit date: 08/20/2017 Discharge date: 08/21/2017  Admitted From: Home  Disposition: Home   Recommendations for Outpatient Follow-up:  1. Follow up with PCP in 1-2 weeks 2. Please obtain BMP/CBC in one week 3. Monitor BP./     Discharge Condition: stable  CODE STATUS; wants to be Full Code.  Diet recommendation: Heart Healthy  Brief/Interim Summary: Daisy Salinas is a 57 y.o. female with medical history significant of HIV; bipolar d/o; hepatitis C; polysubstance abuse; and HTN presenting with syncope. "I passed out."  She got up this AM and her brother came over.  She went outside and was standing beside the car talking to him.  He  Had a sandwich and she went to take a bite.  She got lightheaded and sat down in the backseat.  Next thing she knew the ambulance was there.  She was soaking wet.  She thought she felt ok when she got up this AM.  Her brother thought she was unconscious for 3-4 minutes.  When she came to, she felt "weird" - lightheaded, "like outside of my body".  She was totally back to normal about 5 minutes after the ambulance was there.  She has not been eating/drinking normally, doesn't eat proper meals.  She has been outside in the heat recently.  She does [facility].  She does feel like she might have dehydrated the last days, not voiding as usual - her bladder feels full but she hasn't been able to fully empty her bladder.  No diarrhea or vomiting.  She does have a h/o syncope in the past, and she thinks it is related to Seroquel and not getting enough food in her body at night.   ED Course:  Patient with high-risk syncope - hypotension, bradycardia, low K+.  She went out this AM, passed out 2 minutes, ?aura beforehand, light-headed when she woke up.  Otherwise, unremarkable for symptoms.  BP in 70s, improving with IVF.  Evaluation generally  negative other than sinus bradycardia.  Given IV and PO KCl.  No head injury.  1-Syncope; likely related to hypovolemia, hypokalemia.  Resolved with IV fluids.  Troponin negative.  Counseling provided regarding drug use.  Hold HCTZ at discharge.   Hypokalemia; resolved.   HTN; BP increase. Plan to resume only one BP medications.   HIV; continue with current meds. Follow with ID.   Bipolar; resume meds.   Cocaine use;  Counseling provided.    Discharge Diagnoses:  Principal Problem:   Syncope Active Problems:   Human immunodeficiency virus (HIV) disease (HCC)   CIGARETTE SMOKER   Cocaine abuse (HCC)   Essential hypertension   Hypokalemia   Bipolar affective disorder [facility](HCC)    Discharge Instructions  Discharge Instructions    Diet - low sodium heart healthy   Complete by:  As directed    Increase activity slowly   Complete by:  As directed      Allergies as of 08/21/2017   No Known Allergies     Medication List    STOP taking these medications   hydrochlorothiazide 25 MG tablet Commonly known as:  HYDRODIURIL     TAKE these medications   divalproex 500 MG 24 hr tablet Commonly known as:  DEPAKOTE ER Take 500-1,000 mg by mouth 2 (two) times daily. Pt takes one tablet in the morning and two at bedtime.   GENVOYA 150-150-200-10  MG Tabs tablet Generic drug:  elvitegravir-cobicistat-emtricitabine-tenofovir TAKE 1 TABLET BY MOUTH DAILY WITH BREAKFAST   lisinopril 20 MG tablet Commonly known as:  PRINIVIL,ZESTRIL TAKE 1 TABLET(20 MG) BY MOUTH DAILY   nicotine 14 mg/24hr patch Commonly known as:  NICODERM CQ - dosed in mg/24 hours Place 1 patch (14 mg total) onto the skin daily. Start taking on:  08/22/2017   QUEtiapine 100 MG tablet Commonly known as:  SEROQUEL Take 150-300 mg by mouth See admin instructions. Pt takes 150 mg in the morning and 300 mg in the evening   sertraline 50 MG tablet Commonly known as:  ZOLOFT Take 50 mg by mouth daily.    zolpidem 5 MG tablet Commonly known as:  AMBIEN Take 5 mg by mouth at bedtime as needed for sleep.       No Known Allergies  Consultations: none  Procedures/Studies: Dg Chest 2 View  Result Date: 08/20/2017 CLINICAL DATA:  Syncope. EXAM: CHEST - 2 VIEW COMPARISON:  02/20/2016 FINDINGS: The cardiomediastinal silhouette is unremarkable. There is no evidence of focal airspace disease, pulmonary edema, suspicious pulmonary nodule/mass, pleural effusion, or pneumothorax. No acute bony abnormalities are identified. IMPRESSION: No active cardiopulmonary disease. Electronically Signed   By: Harmon Pier M.D.   On: 08/20/2017 12:13       Subjective: Feeling better, denies lightheaded.    Discharge Exam: Vitals:   08/21/17 0426 08/21/17 0813  BP: (!) 153/79 (!) 144/76  Pulse: 61 (!) 59  Resp: 18   Temp: 98.2 F (36.8 C) 97.7 F (36.5 C)  SpO2: 100% 99%   Vitals:   08/21/17 0013 08/21/17 0426 08/21/17 0500 08/21/17 0813  BP: 117/68 (!) 153/79  (!) 144/76  Pulse: 60 61  (!) 59  Resp: 18 18    Temp: 98.5 F (36.9 C) 98.2 F (36.8 C)  97.7 F (36.5 C)  TempSrc: Oral Oral  Oral  SpO2: 98% 100%  99%  Weight:   74.9 kg (165 lb 2 oz)   Height:        General: Pt is alert, awake, not in acute distress Cardiovascular: RRR, S1/S2 +, no rubs, no gallops Respiratory: CTA bilaterally, no wheezing, no rhonchi Abdominal: Soft, NT, ND, bowel sounds + Extremities: no edema, no cyanosis    The results of significant diagnostics from this hospitalization (including imaging, microbiology, ancillary and laboratory) are listed below for reference.     Microbiology: No results found for this or any previous visit (from the past 240 hour(s)).   Labs: BNP (last 3 results) No results for input(s): BNP in the last 8760 hours. Basic Metabolic Panel: Recent Labs  Lab 08/20/17 1018 08/20/17 1245 08/21/17 0523  NA 140  --  141  K 2.6*  --  3.9  CL 108  --  111  CO2 21*  --  23   GLUCOSE 121*  --  136*  BUN 11  --  10  CREATININE 0.97  --  0.92  CALCIUM 9.1  --  8.9  MG  --  2.0  --    Liver Function Tests: Recent Labs  Lab 08/20/17 1245  AST 22  ALT 8  ALKPHOS 42  BILITOT 0.6  PROT 7.2  ALBUMIN 3.6   No results for input(s): LIPASE, AMYLASE in the last 168 hours. Recent Labs  Lab 08/20/17 1245  AMMONIA 36*   CBC: Recent Labs  Lab 08/20/17 1018 08/21/17 0523  WBC 6.5 6.5  HGB 12.4 11.6*  HCT 37.2 35.0*  MCV 101.6* 103.2*  PLT 109* 93*   Cardiac Enzymes: No results for input(s): CKTOTAL, CKMB, CKMBINDEX, TROPONINI in the last 168 hours. BNP: Invalid input(s): POCBNP CBG: Recent Labs  Lab 08/20/17 1036  GLUCAP 112*   D-Dimer No results for input(s): DDIMER in the last 72 hours. Hgb A1c No results for input(s): HGBA1C in the last 72 hours. Lipid Profile No results for input(s): CHOL, HDL, LDLCALC, TRIG, CHOLHDL, LDLDIRECT in the last 72 hours. Thyroid function studies Recent Labs    08/20/17 2046  TSH 1.274   Anemia work up No results for input(s): VITAMINB12, FOLATE, FERRITIN, TIBC, IRON, RETICCTPCT in the last 72 hours. Urinalysis    Component Value Date/Time   COLORURINE STRAW (A) 08/20/2017 1339   APPEARANCEUR CLEAR 08/20/2017 1339   LABSPEC 1.006 08/20/2017 1339   PHURINE 7.0 08/20/2017 1339   GLUCOSEU NEGATIVE 08/20/2017 1339   HGBUR SMALL (A) 08/20/2017 1339   BILIRUBINUR NEGATIVE 08/20/2017 1339   KETONESUR NEGATIVE 08/20/2017 1339   PROTEINUR NEGATIVE 08/20/2017 1339   UROBILINOGEN 1.0 06/05/2014 1238   NITRITE NEGATIVE 08/20/2017 1339   LEUKOCYTESUR NEGATIVE 08/20/2017 1339   Sepsis Labs Invalid input(s): PROCALCITONIN,  WBC,  LACTICIDVEN Microbiology No results found for this or any previous visit (from the past 240 hour(s)).   Time coordinating discharge: 35 minutes  SIGNED:   Alba Cory, MD  [facility] 08/21/2017, 9:25 AM Pager   If 7PM-7AM, please contact  night-coverage [email or web address] Password [email or web address]

## 2017-08-27 ENCOUNTER — Ambulatory Visit: Payer: Self-pay | Admitting: Family Medicine

## 2017-09-11 ENCOUNTER — Other Ambulatory Visit: Payer: Self-pay | Admitting: Internal Medicine

## 2017-09-11 DIAGNOSIS — B20 Human immunodeficiency virus [HIV] disease: Secondary | ICD-10-CM

## 2017-09-11 DIAGNOSIS — I10 Essential (primary) hypertension: Secondary | ICD-10-CM

## 2017-10-21 ENCOUNTER — Encounter (INDEPENDENT_AMBULATORY_CARE_PROVIDER_SITE_OTHER): Payer: Self-pay | Admitting: *Deleted

## 2017-10-21 ENCOUNTER — Ambulatory Visit (INDEPENDENT_AMBULATORY_CARE_PROVIDER_SITE_OTHER): Payer: Self-pay

## 2017-10-21 VITALS — BP 162/95 | HR 66 | Temp 98.6°F | Wt 152.5 lb

## 2017-10-21 DIAGNOSIS — Z23 Encounter for immunization: Secondary | ICD-10-CM

## 2017-10-21 DIAGNOSIS — Z006 Encounter for examination for normal comparison and control in clinical research program: Secondary | ICD-10-CM

## 2017-10-21 NOTE — Research (Signed)
Daisy Salinas was here for her month 24 visit for [employer]. She is complaining of some rt thigh crampy pain, worse with walking that has been going on for a month. She had a syncopal spell end of July thought to be related to dehydration.  She says she has been good with her adherence with study meds. She will be returning in February for the next study visit.

## 2017-11-12 ENCOUNTER — Encounter: Payer: Self-pay | Admitting: Internal Medicine

## 2018-01-06 ENCOUNTER — Emergency Department (HOSPITAL_COMMUNITY): Payer: Self-pay

## 2018-01-06 ENCOUNTER — Other Ambulatory Visit: Payer: Self-pay

## 2018-01-06 ENCOUNTER — Encounter (HOSPITAL_COMMUNITY): Payer: Self-pay | Admitting: *Deleted

## 2018-01-06 ENCOUNTER — Emergency Department (HOSPITAL_COMMUNITY)
Admission: EM | Admit: 2018-01-06 | Discharge: 2018-01-06 | Disposition: A | Discharge status: Home / Self Care | Payer: Self-pay | Attending: Emergency Medicine | Admitting: Emergency Medicine

## 2018-01-06 DIAGNOSIS — R079 Chest pain, unspecified: Secondary | ICD-10-CM | POA: Insufficient documentation

## 2018-01-06 DIAGNOSIS — J45909 Unspecified asthma, uncomplicated: Secondary | ICD-10-CM | POA: Insufficient documentation

## 2018-01-06 DIAGNOSIS — F319 Bipolar disorder, unspecified: Secondary | ICD-10-CM | POA: Insufficient documentation

## 2018-01-06 DIAGNOSIS — F17210 Nicotine dependence, cigarettes, uncomplicated: Secondary | ICD-10-CM | POA: Insufficient documentation

## 2018-01-06 DIAGNOSIS — I10 Essential (primary) hypertension: Secondary | ICD-10-CM | POA: Insufficient documentation

## 2018-01-06 DIAGNOSIS — B20 Human immunodeficiency virus [HIV] disease: Secondary | ICD-10-CM | POA: Insufficient documentation

## 2018-01-06 LAB — CBC
HEMATOCRIT: 37.7 % (ref 36.0–46.0)
HEMOGLOBIN: 12.2 g/dL (ref 12.0–15.0)
MCH: 32.7 pg (ref 26.0–34.0)
MCHC: 32.4 g/dL (ref 30.0–36.0)
MCV: 101.1 fL — AB (ref 80.0–100.0)
Platelets: 96 10*3/uL — ABNORMAL LOW (ref 150–400)
RBC: 3.73 MIL/uL — ABNORMAL LOW (ref 3.87–5.11)
RDW: 13.8 % (ref 11.5–15.5)
WBC: 9 10*3/uL (ref 4.0–10.5)
nRBC: 0 % (ref 0.0–0.2)

## 2018-01-06 LAB — BASIC METABOLIC PANEL
Anion gap: 12 (ref 5–15)
BUN: 7 mg/dL (ref 6–20)
CHLORIDE: 103 mmol/L (ref 98–111)
CO2: 20 mmol/L — ABNORMAL LOW (ref 22–32)
Calcium: 9.3 mg/dL (ref 8.9–10.3)
Creatinine, Ser: 0.78 mg/dL (ref 0.44–1.00)
GFR calc Af Amer: >60 mL/min (ref >60)
GFR calc non Af Amer: >60 mL/min (ref >60)
GLUCOSE: 95 mg/dL (ref 70–99)
Potassium: 3.3 mmol/L — ABNORMAL LOW (ref 3.5–5.1)
Sodium: 135 mmol/L (ref 135–145)

## 2018-01-06 LAB — I-STAT TROPONIN, ED
Troponin i, poc: 0.01 ng/mL (ref 0.00–0.08)
Troponin i, poc: 0 ng/mL (ref 0.00–0.08)

## 2018-01-06 LAB — D-DIMER, QUANTITATIVE: D-Dimer, Quant: 3.04 ug/mL-FEU — ABNORMAL HIGH (ref 0.00–0.50)

## 2018-01-06 MED ORDER — LIDOCAINE 5 % EX PTCH: 1.0000 | MEDICATED_PATCH | CUTANEOUS | 0 refills | Status: DC

## 2018-01-06 MED ORDER — CYCLOBENZAPRINE HCL 10 MG PO TABS: 10.0000 mg | ORAL_TABLET | Freq: Two times a day (BID) | ORAL | 0 refills | Status: DC | PRN

## 2018-01-06 MED ORDER — OXYCODONE HCL 5 MG PO TABS
5.0000 mg | ORAL_TABLET | Freq: Once | ORAL | Status: AC
  Administered 2018-01-06: 5 mg via ORAL
  Filled 2018-01-06: qty 1

## 2018-01-06 MED ORDER — IOPAMIDOL (ISOVUE-370) INJECTION 76%
75.0000 mL | Freq: Once | INTRAVENOUS | Status: AC | PRN
  Administered 2018-01-06: 75 mL via INTRAVENOUS

## 2018-01-06 MED ORDER — IOPAMIDOL (ISOVUE-370) INJECTION 76%
INTRAVENOUS | Status: AC
  Filled 2018-01-06: qty 100

## 2018-01-06 NOTE — ED Triage Notes (Signed)
Pt to ED by EMS for L sided chest pain radiating into her L shoulder x 2 days. Reports soreness; chest pain reproducible. Also reports cough for a week

## 2018-01-06 NOTE — ED Provider Notes (Signed)
[facility] Emergency Department Provider Note MRN:  [id number]  Arrival date & time: 01/06/18     Chief Complaint   Chest Pain   History of Present Illness   Daisy Salinas is a 57 y.o. year-old female with a history of bipolar disorder, HIV, hypertension presenting to the ED with chief complaint of chest pain.  3 days ago, patient experienced sudden onset left-sided chest pain.  Described as a pressure, moderate in severity, has been constant.  Also has been experiencing cough, cold-like symptoms, multiple sick contacts in the home.  The patient explains the chest pain preceded her cold-like symptoms.  Has also been experiencing pain in the right leg and upper thigh for several weeks.  Denies fever, no vomiting, no numbness or weakness to the arms or legs, no shortness of breath.  Review of Systems  A complete 10 system review of systems was obtained and all systems are negative except as noted in the HPI and PMH.   Patient's Health History    Past Medical History:  Diagnosis Date  . Asthma   . Bipolar 1 disorder (HCC) 2009  . Depression   . GERD (gastroesophageal reflux disease)   . HIV (human immunodeficiency virus infection) (HCC)   . Hypertension   . TB (pulmonary tuberculosis) 1989   was exposed and treated  . UTI (urinary tract infection) 1997    Past Surgical History:  Procedure Laterality Date  . ABDOMINAL HYSTERECTOMY      Family History  Problem Relation Age of Onset  . Coronary artery disease Mother   . Leukemia Father     Social History   Socioeconomic History  . Marital status: Married    Spouse name: Not on file  . Number of children: Not on file  . Years of education: Not on file  . Highest education level: Not on file  Occupational History  . Occupation: unemployed  Social Needs  . Financial resource strain: Not on file  . Food insecurity:    Worry: Not on file    Inability: Not on file  . Transportation needs:    Medical:  Not on file    Non-medical: Not on file  Tobacco Use  . Smoking status: Current Every Day Smoker    Packs/day: 0.50    Years: 45.00    Pack years: 22.50    Types: Cigarettes  . Smokeless tobacco: Never Used  . Tobacco comment: wanting "some cinnamon" to help her stop  Substance and Sexual Activity  . Alcohol use: Yes    Alcohol/week: 65.0 standard drinks    Types: 64 Standard drinks or equivalent, 1 Cans of beer per week    Comment: occ  . Drug use: Yes    Frequency: 4.0 times per week    Types: "Crack" cocaine, Cocaine    Comment: last used last night about 930pm  . Sexual activity: Not Currently    Partners: Male    Comment: declined condoms  Lifestyle  . Physical activity:    Days per week: Not on file    Minutes per session: Not on file  . Stress: Not on file  Relationships  . Social connections:    Talks on phone: Not on file    Gets together: Not on file    Attends religious service: Not on file    Active member of club or organization: Not on file    Attends meetings of clubs or organizations: Not on file    Relationship  status: Not on file  . Intimate partner violence:    Fear of current or ex partner: Not on file    Emotionally abused: Not on file    Physically abused: Not on file    Forced sexual activity: Not on file  Other Topics Concern  . Not on file  Social History Narrative  . Not on file     Physical Exam  Vital Signs and Nursing Notes reviewed Vitals:   01/06/18 1045 01/06/18 1100  BP: (!) 141/80 136/77  Pulse: (!) 55 (!) 56  Resp: (!) 21 (!) 21  Temp:    SpO2: 97% 97%    CONSTITUTIONAL: Well-appearing, NAD NEURO:  Alert and oriented x 3, no focal deficits EYES:  eyes equal and reactive ENT/NECK:  no LAD, no JVD CARDIO: Regular rate, well-perfused, normal S1 and S2; tenderness to palpation to the left lateral chest PULM:  CTAB no wheezing or rhonchi GI/GU:  normal bowel sounds, non-distended, non-tender MSK/SPINE:  No gross deformities,  no edema SKIN:  no rash, atraumatic PSYCH:  Appropriate speech and behavior  Diagnostic and Interventional Summary    EKG Interpretation  Date/Time:  Tuesday January 06 2018 04:25:20 EST Ventricular Rate:  62 PR Interval:  134 QRS Duration: 82 QT Interval:  448 QTC Calculation: 454 R Axis:   73 Text Interpretation:  Normal sinus rhythm Nonspecific T wave abnormality Abnormal ECG Confirmed by Kennis Carina [phone]) on 01/06/2018 7:37:11 AM      Labs Reviewed  BASIC METABOLIC PANEL - Abnormal; Notable for the following components:      Result Value   Potassium 3.3 (*)    CO2 20 (*)    All other components within normal limits  CBC - Abnormal; Notable for the following components:   RBC 3.73 (*)    MCV 101.1 (*)    Platelets 96 (*)    All other components within normal limits  D-DIMER, QUANTITATIVE (NOT AT [facility]) - Abnormal; Notable for the following components:   D-Dimer, Quant 3.04 (*)    All other components within normal limits  I-STAT TROPONIN, ED  I-STAT TROPONIN, ED    CT Angio Chest PE W and/or Wo Contrast  Final Result    DG Chest 2 View  Final Result      Medications  oxyCODONE (Oxy IR/ROXICODONE) immediate release tablet 5 mg (5 mg Oral Given 01/06/18 0838)  iopamidol (ISOVUE-370) 76 % injection 75 mL (75 mLs Intravenous Contrast Given 01/06/18 1033)     Procedures Critical Care  ED Course and Medical Decision Making  I have reviewed the triage vital signs and the nursing notes.  Pertinent labs & imaging results that were available during my care of the patient were reviewed by me and considered in my medical decision making (see below for details).  Favoring musculoskeletal or chest wall pain in this 57 year old female with left-sided chest pain.  Reproducible on exam.  However, given that the chest pain seems to have preceded the cold-like symptoms, also considering that she has been experiencing right leg pain, will screen with d-dimer.  D-dimer  positive, CTPA negative.  Troponin negative x2.  Favoring chest wall related pain in the setting of recent cough.  We will follow-up with PCP.  After the discussed management above, the patient was determined to be safe for discharge.  The patient was in agreement with this plan and all questions regarding their care were answered.  ED return precautions were discussed and the patient will return to  the ED with any significant worsening of condition.  Elmer Sow. Pilar Plate, MD [facility] Emergency Medicine [facility] [email or web address]  Final Clinical Impressions(s) / ED Diagnoses     ICD-10-CM   1. Chest pain, unspecified type R07.9     ED Discharge Orders         Ordered    cyclobenzaprine (FLEXERIL) 10 MG tablet  2 times daily PRN     01/06/18 1110    lidocaine (LIDODERM) 5 %  Every 24 hours     01/06/18 1110             Sabas Sous, MD 01/06/18 1656

## 2018-01-06 NOTE — Discharge Instructions (Addendum)
You were evaluated in the Emergency Department and after careful evaluation, we did not find any emergent condition requiring admission or further testing in the hospital.  Your symptoms today seem to be due to inflammation of the ribs or chest wall related to your recent cough.  Your labs today did not reveal any heart damage, and your CT today did not reveal any blood clots in the lungs.  Your CT scan did reveal some subtle abnormalities that need to be followed up with closely by your regular doctor, possibly with a repeat CT scan or a PET scan.  Please bring the printout of your CT read to your doctor soon as possible.  For your pain, you can use the muscle relaxers provided as well as the numbing patches.  Please return to the Emergency Department if you experience any worsening of your condition.  We encourage you to follow up with a primary care provider.  Thank you for allowing us to be a part of your care.

## 2018-01-06 NOTE — ED Notes (Signed)
Patient verbalizes understanding of discharge instructions. Opportunity for questioning and answers were provided. Armband removed by staff, pt discharged from ED ambulatory to home.  

## 2018-02-02 ENCOUNTER — Other Ambulatory Visit: Payer: Self-pay | Admitting: Internal Medicine

## 2018-02-02 DIAGNOSIS — B20 Human immunodeficiency virus [HIV] disease: Secondary | ICD-10-CM

## 2018-02-02 DIAGNOSIS — I10 Essential (primary) hypertension: Secondary | ICD-10-CM

## 2018-02-25 ENCOUNTER — Encounter (INDEPENDENT_AMBULATORY_CARE_PROVIDER_SITE_OTHER): Payer: Self-pay | Admitting: *Deleted

## 2018-02-25 ENCOUNTER — Encounter: Payer: Self-pay | Admitting: Internal Medicine

## 2018-02-25 ENCOUNTER — Ambulatory Visit
Admission: RE | Admit: 2018-02-25 | Discharge: 2018-02-25 | Disposition: A | Discharge status: Home / Self Care | Payer: Self-pay | Source: Ambulatory Visit | Attending: Internal Medicine | Admitting: Internal Medicine

## 2018-02-25 ENCOUNTER — Ambulatory Visit (INDEPENDENT_AMBULATORY_CARE_PROVIDER_SITE_OTHER): Payer: Self-pay | Admitting: Internal Medicine

## 2018-02-25 DIAGNOSIS — B20 Human immunodeficiency virus [HIV] disease: Secondary | ICD-10-CM

## 2018-02-25 DIAGNOSIS — Z8615 Personal history of latent tuberculosis infection: Secondary | ICD-10-CM

## 2018-02-25 DIAGNOSIS — Z006 Encounter for examination for normal comparison and control in clinical research program: Secondary | ICD-10-CM

## 2018-02-25 DIAGNOSIS — F1410 Cocaine abuse, uncomplicated: Secondary | ICD-10-CM

## 2018-02-25 DIAGNOSIS — F1414 Cocaine abuse with cocaine-induced mood disorder: Secondary | ICD-10-CM

## 2018-02-25 MED ORDER — STUDY - REPRIEVE A5332 - PITAVASTATIN 4MG OR PLACEBO TABLET (PI-VAN DAM): 4.0000 mg | ORAL_TABLET | Freq: Every day | ORAL | Status: DC

## 2018-02-25 NOTE — Assessment & Plan Note (Signed)
I encouraged her to abstain.  She will get a urine drug screen today.

## 2018-02-25 NOTE — Assessment & Plan Note (Signed)
Her adherence has been much improved over the last few years and her infection remains under good long-term control.  She will get lab work today and continue [place].  She will follow-up in 6 months.

## 2018-02-25 NOTE — Assessment & Plan Note (Signed)
She has no clinical evidence of active tuberculosis.  I will obtain a chest x-ray today.

## 2018-02-25 NOTE — Progress Notes (Signed)
Patient Active Problem List   Diagnosis Date Noted  . Chronic hepatitis C without hepatic coma (HCC) 09/08/2007    Priority: High  . Human immunodeficiency virus (HIV) disease (HCC) 10/29/2005    Priority: High  . History of latent tuberculosis 02/25/2018  . Syncope 08/20/2017  . Bipolar affective disorder (HCC) 08/20/2017  . DJD (degenerative joint disease) 03/12/2016  . Right hip pain 08/08/2015  . Cocaine abuse (HCC) 11/30/2009  . BRONCHITIS, CHRONIC 01/03/2009  . CONSTIPATION, CHRONIC 05/09/2008  . DE QUERVAIN'S TENOSYNOVITIS, LEFT WRIST 05/09/2008  . HYSTERECTOMY, HX OF 05/13/2006  . Depression 05/06/2006  . SYPHILIS, LATE, LATENT 10/29/2005  . DYSLIPIDEMIA 10/29/2005  . CIGARETTE SMOKER 10/29/2005  . Essential hypertension 10/29/2005  . PROLAPSE, VAGINAL WALL NOS 10/29/2005  . MENORRHAGIA 10/29/2005  . INSOMNIA 10/29/2005    Patient's Medications  New Prescriptions   No medications on file  Previous Medications   CYCLOBENZAPRINE (FLEXERIL) 10 MG TABLET    Take 1 tablet (10 mg total) by mouth 2 (two) times daily as needed for muscle spasms.   DIVALPROEX (DEPAKOTE ER) 500 MG 24 HR TABLET    Take 500-1,000 mg by mouth See admin instructions. Take one tablet by mouth in the morning and two tablets at bedtime.   GENVOYA 150-150-200-10 MG TABS TABLET    TAKE 1 TABLET BY MOUTH DAILY WITH BREAKFAST   LIDOCAINE (LIDODERM) 5 %    Place 1 patch onto the skin daily. Remove & Discard patch within 12 hours or as directed by MD   LISINOPRIL (PRINIVIL,ZESTRIL) 20 MG TABLET    TAKE 1 TABLET(20 MG) BY MOUTH DAILY   NICOTINE (NICODERM CQ - DOSED IN MG/24 HOURS) 14 MG/24HR PATCH    Place 1 patch (14 mg total) onto the skin daily.   QUETIAPINE (SEROQUEL) 100 MG TABLET    Take 150-300 mg by mouth See admin instructions. Take 150 mg in the morning and 300 mg in the evening.   SERTRALINE (ZOLOFT) 50 MG TABLET    Take 50 mg by mouth daily.   ZOLPIDEM (AMBIEN) 5 MG TABLET    Take 5  mg by mouth at bedtime as needed for sleep.   Modified Medications   No medications on file  Discontinued Medications   No medications on file    Subjective: Daisy Salinas is in for her routine HIV follow-up visit.  She denies any problems obtaining, taking or tolerating her Genvoya.  She denies missing any doses recently.  She last used any cocaine a little over 1 month ago.  She is trying hard to quit.  She has had transportation problems and has not been able to get back to [place] recently.  [facility] [place] [facility].  She is still smoking cigarettes but wants to quit.  She says it is difficult because her husband is still smoking and tempts her.  She is trying to get into a work program.  She says that she needs a urine drug screen to prove that she is not using drugs currently.  She also needs a chest x-ray given her history of previously treated latent tuberculosis.  She denies feeling depressed.  Review of Systems: Review of Systems  Constitutional: Negative for chills, diaphoresis, fever, malaise/fatigue and weight loss.  HENT: Negative for sore throat.   Respiratory: Negative for cough, sputum production and shortness of breath.   Cardiovascular: Negative for chest pain.  Gastrointestinal: Negative for abdominal pain, diarrhea, heartburn, nausea and vomiting.  Genitourinary: Negative for dysuria and frequency.  Musculoskeletal: Positive for back pain. Negative for joint pain and myalgias.  Skin: Negative for rash.  Neurological: Negative for dizziness and headaches.  Psychiatric/Behavioral: Positive for substance abuse. Negative for depression. The patient is not nervous/anxious.     Past Medical History:  Diagnosis Date  . Asthma   . Bipolar 1 disorder (HCC) 2009  . Depression   . GERD (gastroesophageal reflux disease)   . HIV (human immunodeficiency virus infection) (HCC)   . Hypertension   . TB (pulmonary tuberculosis) 1989   was exposed  and treated  . UTI (urinary tract infection) 1997    Social History   Tobacco Use  . Smoking status: Current Every Day Smoker    Packs/day: 0.50    Years: 45.00    Pack years: 22.50    Types: Cigarettes  . Smokeless tobacco: Never Used  . Tobacco comment: wanting "some cinnamon" to help her stop  Substance Use Topics  . Alcohol use: Yes    Alcohol/week: 65.0 standard drinks    Types: 64 Standard drinks or equivalent, 1 Cans of beer per week    Comment: occ  . Drug use: Yes    Frequency: 4.0 times per week    Types: "Crack" cocaine, Cocaine    Comment: last used last night about 930pm    Family History  Problem Relation Age of Onset  . Coronary artery disease Mother   . Leukemia Father     No Known Allergies  Health Maintenance  Topic Date Due  . TETANUS/TDAP  06/12/1978  . PAP SMEAR-Modifier  03/24/2009  . MAMMOGRAM  06/11/2009  . COLONOSCOPY  06/23/2018  . INFLUENZA VACCINE  Completed  . Hepatitis C Screening  Completed  . HIV Screening  Completed    Objective:  Vitals:   02/25/18 0837  BP: (!) 156/89  Pulse: 75  Temp: 98.3 F (36.8 C)  Weight: 158 lb (71.7 kg)   Body mass index is 31.91 kg/m.  Physical Exam Constitutional:      Comments: She is talkative and in good spirits.  HENT:     Mouth/Throat:     Pharynx: No oropharyngeal exudate.  Eyes:     Conjunctiva/sclera: Conjunctivae normal.  Cardiovascular:     Rate and Rhythm: Normal rate and regular rhythm.     Heart sounds: No murmur.  Pulmonary:     Breath sounds: Normal breath sounds.  Abdominal:     Palpations: Abdomen is soft. There is no mass.     Tenderness: There is no abdominal tenderness.  Musculoskeletal: Normal range of motion.  Skin:    Findings: No rash.  Neurological:     Mental Status: She is alert and oriented to person, place, and time.  Psychiatric:        Mood and Affect: Mood normal.     Lab Results Lab Results  Component Value Date   WBC 9.0 01/06/2018    HGB 12.2 01/06/2018   HCT 37.7 01/06/2018   MCV 101.1 (H) 01/06/2018   PLT 96 (L) 01/06/2018    Lab Results  Component Value Date   CREATININE 0.78 01/06/2018   BUN 7 01/06/2018   NA 135 01/06/2018   K 3.3 (L) 01/06/2018   CL 103 01/06/2018   CO2 20 (L) 01/06/2018    Lab Results  Component Value Date   ALT 8 08/20/2017   AST 22 08/20/2017  ALKPHOS 42 08/20/2017   BILITOT 0.6 08/20/2017    Lab Results  Component Value Date   CHOL 152 02/26/2016   HDL 30 (L) 02/26/2016   LDLCALC 56 02/26/2016   TRIG 331 (H) 02/26/2016   CHOLHDL 5.1 (H) 02/26/2016   Lab Results  Component Value Date   LABRPR NON-REACTIVE 05/08/2017   RPRTITER 1:1 07/19/2015   HIV 1 RNA Quant (copies/mL)  Date Value  05/08/2017 26 (H)  09/27/2016 <20 DETECTED (A)  02/26/2016 <20 DETECTED (A)   CD4 T Cell Abs (/uL)  Date Value  05/08/2017 1,200  09/27/2016 1,630  02/26/2016 1,490     Problem List Items Addressed This Visit      High   Human immunodeficiency virus (HIV) disease (HCC)    Her adherence has been much improved over the last few years and her infection remains under good long-term control.  She will get lab work today and continue [place].  She will follow-up in 6 months.      Relevant Orders   T-helper cell (CD4)- ([facility] clinic only)   HIV-1 RNA quant-no reflex-bld   CBC   Comprehensive metabolic panel   RPR   Pain Mgmt, Profile 1 w/o Conf, U     Unprioritized   History of latent tuberculosis    She has no clinical evidence of active tuberculosis.  I will obtain a chest x-ray today.      Relevant Orders   DG Chest 2 View   RESOLVED: Cocaine abuse with cocaine-induced mood disorder (HCC)   Relevant Orders   Pain Mgmt, Profile 1 w/o Conf, U   Cocaine abuse (HCC)    I encouraged her to abstain.  She will get a urine drug screen today.           Cliffton AstersJohn Kileigh Ortmann, MD [facility] [phone] pager   [phone]  cell 02/25/2018, 10:01 AM

## 2018-02-25 NOTE — Research (Signed)
Daisy Salinas was here for her month 28 study visit for Reprieve, A Randomized Trial to Prevent Vascular Events in HIV (study drug is Pitavastatin 4mg  or placebo) Her adherence has been good with the study meds. She was seen in the ED in December for chest pain and doesn't remember much about it and said she had done cocaine the day before. Cardiac/PE ruled out and diagnosed as muscoskeletal pain.

## 2018-02-26 LAB — T-HELPER CELL (CD4) - (RCID CLINIC ONLY)
CD4 % Helper T Cell: 41 % (ref 33–55)
CD4 T Cell Abs: 1600 /uL (ref 400–2700)

## 2018-02-27 LAB — COMPREHENSIVE METABOLIC PANEL
AG RATIO: 1.1 (calc) (ref 1.0–2.5)
ALBUMIN MSPROF: 3.9 g/dL (ref 3.6–5.1)
ALT: 6 U/L (ref 6–29)
AST: 23 U/L (ref 10–35)
Alkaline phosphatase (APISO): 63 U/L (ref 37–153)
BUN: 15 mg/dL (ref 7–25)
CO2: 26 mmol/L (ref 20–32)
Calcium: 9.3 mg/dL (ref 8.6–10.4)
Chloride: 107 mmol/L (ref 98–110)
Creat: 0.74 mg/dL (ref 0.50–1.05)
GLUCOSE: 92 mg/dL (ref 65–99)
Globulin: 3.6 g/dL (calc) (ref 1.9–3.7)
POTASSIUM: 3.8 mmol/L (ref 3.5–5.3)
SODIUM: 141 mmol/L (ref 135–146)
TOTAL PROTEIN: 7.5 g/dL (ref 6.1–8.1)
Total Bilirubin: 0.3 mg/dL (ref 0.2–1.2)

## 2018-02-27 LAB — PAIN MGMT, PROFILE 1 W/O CONF, U
AMPHETAMINES: NEGATIVE ng/mL (ref <500)
Barbiturates: NEGATIVE ng/mL (ref <300)
Benzodiazepines: NEGATIVE ng/mL (ref <100)
COCAINE METABOLITE: NEGATIVE ng/mL (ref <150)
Creatinine: 82.2 mg/dL
MARIJUANA METABOLITE: NEGATIVE ng/mL (ref <20)
Methadone Metabolite: NEGATIVE ng/mL (ref <100)
OPIATES: NEGATIVE ng/mL (ref <100)
OXYCODONE: NEGATIVE ng/mL (ref <100)
Oxidant: NEGATIVE ug/mL (ref <200)
PHENCYCLIDINE: NEGATIVE ng/mL (ref <25)
pH: 7.3 (ref 4.5–9.0)

## 2018-02-27 LAB — CBC
HEMATOCRIT: 34.9 % — AB (ref 35.0–45.0)
Hemoglobin: 11.9 g/dL (ref 11.7–15.5)
MCH: 34.2 pg — ABNORMAL HIGH (ref 27.0–33.0)
MCHC: 34.1 g/dL (ref 32.0–36.0)
MCV: 100.3 fL — AB (ref 80.0–100.0)
MPV: 12.3 fL (ref 7.5–12.5)
Platelets: 112 10*3/uL — ABNORMAL LOW (ref 140–400)
RBC: 3.48 10*6/uL — ABNORMAL LOW (ref 3.80–5.10)
RDW: 13.4 % (ref 11.0–15.0)
WBC: 6 10*3/uL (ref 3.8–10.8)

## 2018-02-27 LAB — HIV-1 RNA QUANT-NO REFLEX-BLD
HIV 1 RNA QUANT: DETECTED {copies}/mL — AB
HIV-1 RNA QUANT, LOG: DETECTED {Log_copies}/mL — AB

## 2018-02-27 LAB — RPR: RPR Ser Ql: NONREACTIVE

## 2018-03-02 ENCOUNTER — Telehealth: Payer: Self-pay

## 2018-03-02 NOTE — Telephone Encounter (Signed)
-----   Message from Cliffton Asters, MD sent at 02/27/2018  2:34 PM EST ----- Daisy Salinas was supposed to get a urine drug screen when she was in clinic this week.  She had requested it for a work program she is applying for.  I ordered it but it was apparently not done.  Please call Rinoa and see if she can come back in next week to give another sample.  Thanks.Jonny Ruiz

## 2018-03-02 NOTE — Telephone Encounter (Signed)
Mailed  urine drug screen results and chest x-ray results per Anagha to her address.   Patient doesn't need to come in to re-due urine drug screen, results came in.  Gerarda Fraction, CMA

## 2018-03-20 ENCOUNTER — Other Ambulatory Visit: Payer: Self-pay | Admitting: Internal Medicine

## 2018-03-20 DIAGNOSIS — B20 Human immunodeficiency virus [HIV] disease: Secondary | ICD-10-CM

## 2018-03-20 DIAGNOSIS — I10 Essential (primary) hypertension: Secondary | ICD-10-CM

## 2018-05-28 ENCOUNTER — Ambulatory Visit: Payer: Self-pay

## 2018-05-28 ENCOUNTER — Telehealth: Payer: Self-pay | Admitting: *Deleted

## 2018-05-28 NOTE — Telephone Encounter (Signed)
Daisy Salinas completed her Reprieve month 32 visit over the telephone. She is taking study medication and Genvoya daily. She reports no muscle weakness, no muscle aches, and no medication changes. Her address was verified for medication shipment. Her month 36 fasting visit is scheduled for 9/8.

## 2018-06-10 ENCOUNTER — Encounter: Payer: Self-pay | Admitting: Internal Medicine

## 2018-07-07 ENCOUNTER — Other Ambulatory Visit: Payer: Self-pay | Admitting: Internal Medicine

## 2018-07-07 DIAGNOSIS — I10 Essential (primary) hypertension: Secondary | ICD-10-CM

## 2018-07-07 DIAGNOSIS — B20 Human immunodeficiency virus [HIV] disease: Secondary | ICD-10-CM

## 2018-07-08 ENCOUNTER — Encounter: Payer: Self-pay | Admitting: Gastroenterology

## 2018-08-05 ENCOUNTER — Encounter: Payer: Self-pay | Admitting: General Practice

## 2018-08-05 NOTE — Progress Notes (Signed)
[place] Progress Notes  Call from Joia Doyle [phone]) - "I need to speak to a social worker."  Left VM for MR Jimmye Norman w my work cell contact information.  Edwyna Shell, LCSW Clinical Social Worker Phone:  [phone]

## 2018-08-19 ENCOUNTER — Other Ambulatory Visit: Payer: Self-pay | Admitting: *Deleted

## 2018-08-19 DIAGNOSIS — B20 Human immunodeficiency virus [HIV] disease: Secondary | ICD-10-CM

## 2018-08-24 ENCOUNTER — Telehealth: Payer: Self-pay | Admitting: Internal Medicine

## 2018-08-24 NOTE — Telephone Encounter (Signed)
COVID-19 Pre-Screening Questions:08/24/18 ° °Do you currently have a fever (>100 °F), chills or unexplained body aches? NO ° °Are you currently experiencing new cough, shortness of breath, sore throat, runny nose? NO  °•  °• Have you recently travelled outside the state of [place] in the last 14 days?NO °•  °Have you been in contact with someone that is currently pending confirmation of Covid19 testing or has been confirmed to have the Covid19 virus? NO ° °**If the patient answers NO to ALL questions -  advise the patient to please call the clinic before coming to the office should any symptoms develop.  ° ° ° °

## 2018-08-25 ENCOUNTER — Ambulatory Visit: Payer: Self-pay | Admitting: Internal Medicine

## 2018-08-25 ENCOUNTER — Other Ambulatory Visit: Payer: Self-pay

## 2018-08-25 DIAGNOSIS — B20 Human immunodeficiency virus [HIV] disease: Secondary | ICD-10-CM

## 2018-08-26 LAB — T-HELPER CELL (CD4) - (RCID CLINIC ONLY)
CD4 % Helper T Cell: 41 % (ref 33–65)
CD4 T Cell Abs: 1662 /uL (ref 400–1790)

## 2018-09-01 ENCOUNTER — Other Ambulatory Visit: Payer: Self-pay

## 2018-09-01 ENCOUNTER — Ambulatory Visit (INDEPENDENT_AMBULATORY_CARE_PROVIDER_SITE_OTHER): Payer: Self-pay | Admitting: Internal Medicine

## 2018-09-01 DIAGNOSIS — F1410 Cocaine abuse, uncomplicated: Secondary | ICD-10-CM

## 2018-09-01 DIAGNOSIS — I10 Essential (primary) hypertension: Secondary | ICD-10-CM

## 2018-09-01 DIAGNOSIS — F3342 Major depressive disorder, recurrent, in full remission: Secondary | ICD-10-CM

## 2018-09-01 DIAGNOSIS — B20 Human immunodeficiency virus [HIV] disease: Secondary | ICD-10-CM

## 2018-09-01 DIAGNOSIS — F17200 Nicotine dependence, unspecified, uncomplicated: Secondary | ICD-10-CM

## 2018-09-01 LAB — HIV-1 RNA QUANT-NO REFLEX-BLD
HIV 1 RNA Quant: <20 copies/mL
HIV-1 RNA Quant, Log: <1.3 Log copies/mL

## 2018-09-01 NOTE — Progress Notes (Signed)
Patient Active Problem List   Diagnosis Date Noted  . Chronic hepatitis C without hepatic coma (HCC) 09/08/2007    Priority: High  . Human immunodeficiency virus (HIV) disease (HCC) 10/29/2005    Priority: High  . History of latent tuberculosis 02/25/2018  . Syncope 08/20/2017  . Bipolar affective disorder (HCC) 08/20/2017  . DJD (degenerative joint disease) 03/12/2016  . Right hip pain 08/08/2015  . Cocaine abuse (HCC) 11/30/2009  . BRONCHITIS, CHRONIC 01/03/2009  . CONSTIPATION, CHRONIC 05/09/2008  . DE QUERVAIN'S TENOSYNOVITIS, LEFT WRIST 05/09/2008  . HYSTERECTOMY, HX OF 05/13/2006  . Depression 05/06/2006  . SYPHILIS, LATE, LATENT 10/29/2005  . DYSLIPIDEMIA 10/29/2005  . CIGARETTE SMOKER 10/29/2005  . Essential hypertension 10/29/2005  . PROLAPSE, VAGINAL WALL NOS 10/29/2005  . MENORRHAGIA 10/29/2005  . INSOMNIA 10/29/2005    Patient's Medications  New Prescriptions   No medications on file  Previous Medications   CYCLOBENZAPRINE (FLEXERIL) 10 MG TABLET    Take 1 tablet (10 mg total) by mouth 2 (two) times daily as needed for muscle spasms.   DIVALPROEX (DEPAKOTE ER) 500 MG 24 HR TABLET    Take 500-1,000 mg by mouth See admin instructions. Take one tablet by mouth in the morning and two tablets at bedtime.   GENVOYA 150-150-200-10 MG TABS TABLET    TAKE 1 TABLET BY MOUTH EVERY DAY WITH BREAKFAST   LIDOCAINE (LIDODERM) 5 %    Place 1 patch onto the skin daily. Remove & Discard patch within 12 hours or as directed by MD   LISINOPRIL (ZESTRIL) 20 MG TABLET    TAKE 1 TABLET BY MOUTH EVERY DAY   NICOTINE (NICODERM CQ - DOSED IN MG/24 HOURS) 14 MG/24HR PATCH    Place 1 patch (14 mg total) onto the skin daily.   QUETIAPINE (SEROQUEL) 100 MG TABLET    Take 150-300 mg by mouth See admin instructions. Take 150 mg in the morning and 300 mg in the evening.   SERTRALINE (ZOLOFT) 50 MG TABLET    Take 50 mg by mouth daily.   STUDY - REPRIEVE [phone] - PITAVASTATIN 4 MG OR  PLACEBO TABLET    Take 1 tablet (4 mg total) by mouth daily.   ZOLPIDEM (AMBIEN) 5 MG TABLET    Take 5 mg by mouth at bedtime as needed for sleep.   Modified Medications   No medications on file  Discontinued Medications   No medications on file    Subjective: Daisy Salinas is in for her routine HIV follow-up visit.  She has had no problems obtaining, taking or tolerating her Genvoya.  She is using her pillbox and denies missing any doses.  She tells me that her husband, [name], was recently hospitalized with angioedema related to lisinopril.  She says that she used to take lisinopril but had some swelling of her tongue last year and stopped it.  She has no PCP.  She continues to smoke cigarettes daily and use cocaine every few weeks.  Review of Systems: Review of Systems  Constitutional: Negative for chills, diaphoresis, fever, malaise/fatigue and weight loss.  HENT: Negative for sore throat.   Respiratory: Negative for cough, sputum production and shortness of breath.   Cardiovascular: Negative for chest pain.  Gastrointestinal: Negative for abdominal pain, diarrhea, heartburn, nausea and vomiting.  Genitourinary: Negative for dysuria and frequency.  Musculoskeletal: Positive for back pain. Negative for joint pain and myalgias.  Skin: Negative for rash.  Neurological: Negative for dizziness  and headaches.  Psychiatric/Behavioral: Positive for substance abuse. Negative for depression. The patient is not nervous/anxious.     Past Medical History:  Diagnosis Date  . Asthma   . Bipolar 1 disorder (HCC) 2009  . Depression   . GERD (gastroesophageal reflux disease)   . HIV (human immunodeficiency virus infection) (HCC)   . Hypertension   . TB (pulmonary tuberculosis) 1989   was exposed and treated  . UTI (urinary tract infection) 1997    Social History   Tobacco Use  . Smoking status: Current Every Day Smoker    Packs/day: 0.50    Years: 45.00    Pack years: 22.50    Types:  Cigarettes  . Smokeless tobacco: Never Used  . Tobacco comment: wanting "some cinnamon" to help her stop  Substance Use Topics  . Alcohol use: Yes    Alcohol/week: 65.0 standard drinks    Types: 64 Standard drinks or equivalent, 1 Cans of beer per week    Comment: occ  . Drug use: Yes    Frequency: 4.0 times per week    Types: "Crack" cocaine, Cocaine    Comment: last used last night about 930pm    Family History  Problem Relation Age of Onset  . Coronary artery disease Mother   . Leukemia Father     No Known Allergies  Health Maintenance  Topic Date Due  . TETANUS/TDAP  06/12/1978  . PAP SMEAR-Modifier  03/24/2009  . MAMMOGRAM  06/11/2009  . COLONOSCOPY  06/23/2018  . INFLUENZA VACCINE  08/22/2018  . Hepatitis C Screening  Completed  . HIV Screening  Completed    Objective:  Vitals:   09/01/18 1412  BP: (!) 160/94  Pulse: 75  Temp: 98.3 F (36.8 C)   There is no height or weight on file to calculate BMI.  Physical Exam HENT:     Mouth/Throat:     Pharynx: No oropharyngeal exudate.  Eyes:     Conjunctiva/sclera: Conjunctivae normal.  Cardiovascular:     Rate and Rhythm: Normal rate and regular rhythm.     Heart sounds: No murmur.  Pulmonary:     Effort: Pulmonary effort is normal.     Breath sounds: Normal breath sounds.  Abdominal:     Palpations: Abdomen is soft. There is no mass.     Tenderness: There is no abdominal tenderness.  Musculoskeletal: Normal range of motion.  Skin:    Findings: No rash.  Neurological:     Mental Status: She is alert and oriented to person, place, and time.  Psychiatric:        Mood and Affect: Mood normal.     Lab Results Lab Results  Component Value Date   WBC 6.0 02/25/2018   HGB 11.9 02/25/2018   HCT 34.9 (L) 02/25/2018   MCV 100.3 (H) 02/25/2018   PLT 112 (L) 02/25/2018    Lab Results  Component Value Date   CREATININE 0.74 02/25/2018   BUN 15 02/25/2018   NA 141 02/25/2018   K 3.8 02/25/2018   CL  107 02/25/2018   CO2 26 02/25/2018    Lab Results  Component Value Date   ALT 6 02/25/2018   AST 23 02/25/2018   ALKPHOS 42 08/20/2017   BILITOT 0.3 02/25/2018    Lab Results  Component Value Date   CHOL 152 02/26/2016   HDL 30 (L) 02/26/2016   LDLCALC 56 02/26/2016   TRIG 331 (H) 02/26/2016   CHOLHDL 5.1 (H) 02/26/2016  Lab Results  Component Value Date   LABRPR NON-REACTIVE 02/25/2018   RPRTITER 1:1 07/19/2015   HIV 1 RNA Quant (copies/mL)  Date Value  08/25/2018 <20 NOT DETECTED  02/25/2018 <20 DETECTED (A)  05/08/2017 26 (H)   CD4 T Cell Abs (/uL)  Date Value  08/25/2018 1,662  02/25/2018 1,600  05/08/2017 1,200     Problem List Items Addressed This Visit      High   Human immunodeficiency virus (HIV) disease ([place])    Her infection remains under excellent, long-term control.  She will continue Genvoya and follow-up after lab work in 6 months.      Relevant Orders   T-helper cell (CD4)- ([facility] clinic only)   HIV-1 RNA quant-no reflex-bld   CBC   Comprehensive metabolic panel   Lipid panel   RPR     Unprioritized   Essential hypertension    She is off her medication and her blood pressure is above goal.  We will see if we can get her a new PCP.      Depression    Her depression is in remission.      Cocaine abuse ([place])    Both she and her husband had been clean for several years.  I asked her to consider getting clean again.      CIGARETTE SMOKER    I talked to her again about the importance of cigarette cessation.           Michel Bickers, MD [facility] [phone] pager   [phone] cell 09/01/2018, 2:58 PM

## 2018-09-01 NOTE — Assessment & Plan Note (Signed)
Both she and her husband had been clean for several years.  I asked her to consider getting clean again.

## 2018-09-01 NOTE — Assessment & Plan Note (Signed)
Her infection remains under excellent, long-term control.  She will continue Genvoya and follow-up after lab work in 6 months. 

## 2018-09-01 NOTE — Assessment & Plan Note (Signed)
Her depression is in remission. 

## 2018-09-01 NOTE — Assessment & Plan Note (Signed)
I talked to her again about the importance of cigarette cessation. 

## 2018-09-01 NOTE — Assessment & Plan Note (Signed)
She is off her medication and her blood pressure is above goal.  We will see if we can get her a new PCP.

## 2018-09-24 ENCOUNTER — Other Ambulatory Visit: Payer: Self-pay | Admitting: Internal Medicine

## 2018-09-24 DIAGNOSIS — B20 Human immunodeficiency virus [HIV] disease: Secondary | ICD-10-CM

## 2018-09-24 DIAGNOSIS — I10 Essential (primary) hypertension: Secondary | ICD-10-CM

## 2018-10-01 ENCOUNTER — Encounter: Payer: Self-pay | Admitting: Internal Medicine

## 2018-10-09 ENCOUNTER — Encounter (INDEPENDENT_AMBULATORY_CARE_PROVIDER_SITE_OTHER): Payer: Self-pay

## 2018-10-09 ENCOUNTER — Encounter: Payer: Self-pay | Admitting: Family Medicine

## 2018-10-09 ENCOUNTER — Other Ambulatory Visit: Payer: Self-pay

## 2018-10-09 ENCOUNTER — Ambulatory Visit: Payer: Self-pay | Attending: Family Medicine | Admitting: Family Medicine

## 2018-10-09 VITALS — BP 158/86 | HR 72 | Ht 59.0 in | Wt 161.2 lb

## 2018-10-09 VITALS — BP 168/92 | HR 67 | Temp 97.8°F

## 2018-10-09 DIAGNOSIS — Z23 Encounter for immunization: Secondary | ICD-10-CM

## 2018-10-09 DIAGNOSIS — R058 Other specified cough: Secondary | ICD-10-CM

## 2018-10-09 DIAGNOSIS — I10 Essential (primary) hypertension: Secondary | ICD-10-CM

## 2018-10-09 DIAGNOSIS — R05 Cough: Secondary | ICD-10-CM

## 2018-10-09 DIAGNOSIS — Z006 Encounter for examination for normal comparison and control in clinical research program: Secondary | ICD-10-CM

## 2018-10-09 MED ORDER — AMLODIPINE BESYLATE 5 MG PO TABS: 5.0000 mg | ORAL_TABLET | Freq: Every day | ORAL | 6 refills | Status: DC

## 2018-10-09 MED FILL — AMLODIPINE BESYLATE 5 MG TA: 5 | 30 days supply | Qty: 30 | Fill #0 | Status: TO

## 2018-10-09 NOTE — Progress Notes (Signed)
New Patient Office Visit  Subjective:  Patient ID: Daisy Salinas, female    DOB: Mar 08, 1960  Age: 58 y.o. MRN: [id number]  CC:  Chief Complaint  Patient presents with  . New Patient (Initial Visit)    HPI Raphael Coppin presents to establish care and for medical management of her blood pressure. She is currently on lisinopril but admits that she does not take it every day. Her husband had a reaction to this medication with swelling of his lips and tongue and she is afraid of this medication and would like to be on something else is possible. She denies any headaches or dizziness related to her blood pressure. She does have a recurrent dry cough but has had no swelling in her lips/tongue or sensation of throat tightening. She had blood work done earlier today that had been ordered by her infectious disease doctor, Dr. Orvan Falconer, who she sees in follow-up of HIV which is currently well controlled. She did take her lisinopril earlier today.  Overall she feels well.  She is concerned about the possibility of diabetes because of some changes in her toenails.  She did have blood work done recently by her infectious disease doctor.  She has never been told in the past that she has diabetes.  She denies increased thirst or urinary frequency.  Past Medical History:  Diagnosis Date  . Asthma   . Bipolar 1 disorder (HCC) 2009  . Depression   . GERD (gastroesophageal reflux disease)   . HIV (human immunodeficiency virus infection) (HCC)   . Hypertension   . TB (pulmonary tuberculosis) 1989   was exposed and treated  . UTI (urinary tract infection) 1997    Past Surgical History:  Procedure Laterality Date  . ABDOMINAL HYSTERECTOMY      Family History  Problem Relation Age of Onset  . Coronary artery disease Mother   . Leukemia Father     Social History   Socioeconomic History  . Marital status: Married    Spouse name: Not on file  . Number of children: Not on file  . Years of  education: Not on file  . Highest education level: Not on file  Occupational History  . Occupation: unemployed  Social Needs  . Financial resource strain: Not on file  . Food insecurity    Worry: Not on file    Inability: Not on file  . Transportation needs    Medical: Not on file    Non-medical: Not on file  Tobacco Use  . Smoking status: Current Every Day Smoker    Packs/day: 0.50    Years: 45.00    Pack years: 22.50    Types: Cigarettes  . Smokeless tobacco: Never Used  . Tobacco comment: wanting "some cinnamon" to help her stop  Substance and Sexual Activity  . Alcohol use: Yes    Alcohol/week: 65.0 standard drinks    Types: 1 Cans of beer, 64 Standard drinks or equivalent per week    Comment: occ  . Drug use: Yes    Frequency: 4.0 times per week    Types: "Crack" cocaine, Cocaine    Comment: last used last night about 930pm  . Sexual activity: Not Currently    Partners: Male    Comment: declined condoms  Lifestyle  . Physical activity    Days per week: Not on file    Minutes per session: Not on file  . Stress: Not on file  Relationships  . Social connections  Talks on phone: Not on file    Gets together: Not on file    Attends religious service: Not on file    Active member of club or organization: Not on file    Attends meetings of clubs or organizations: Not on file    Relationship status: Not on file  . Intimate partner violence    Fear of current or ex partner: Not on file    Emotionally abused: Not on file    Physically abused: Not on file    Forced sexual activity: Not on file  Other Topics Concern  . Not on file  Social History Narrative  . Not on file    ROS Review of Systems  Constitutional: Negative for chills, fatigue and fever.  HENT: Negative for sore throat and trouble swallowing.   Respiratory: Positive for cough. Negative for shortness of breath.   Cardiovascular: Negative for chest pain, palpitations and leg swelling.    Gastrointestinal: Negative for abdominal pain, constipation, diarrhea and nausea.  Endocrine: Negative for polydipsia, polyphagia and polyuria.  Genitourinary: Negative for dysuria and frequency.  Musculoskeletal: Negative for arthralgias and back pain.  Skin:       Has some toenails that are thickened and discolored  Neurological: Negative for dizziness and headaches.  Hematological: Negative for adenopathy. Does not bruise/bleed easily.    Objective:   Today's Vitals: BP (!) 158/86 (BP Location: Left Arm, Patient Position: Sitting, Cuff Size: Large)   Pulse 72   Ht 4\' 11"  (1.499 m)   Wt 161 lb 3.2 oz (73.1 kg)   SpO2 98%   BMI 32.56 kg/m   Physical Exam Vitals signs and nursing note reviewed.  Constitutional:      Appearance: Normal appearance.  Neck:     Musculoskeletal: Normal range of motion and neck supple.     Vascular: No carotid bruit.  Cardiovascular:     Rate and Rhythm: Normal rate and regular rhythm.  Pulmonary:     Effort: Pulmonary effort is normal.     Breath sounds: Normal breath sounds.  Abdominal:     Palpations: Abdomen is soft. There is no mass.     Tenderness: There is no abdominal tenderness. There is no right CVA tenderness, left CVA tenderness or guarding.  Lymphadenopathy:     Cervical: No cervical adenopathy.  Skin:    General: Skin is warm and dry.     Comments: Right great toenail thickened and slightly discolored as well as left second toe with thickened nail with a darkened discoloration  Neurological:     General: No focal deficit present.     Mental Status: She is alert and oriented to person, place, and time.  Psychiatric:        Mood and Affect: Mood normal.        Behavior: Behavior normal.        Thought Content: Thought content normal.        Judgment: Judgment normal.     Assessment & Plan:  3.. Need for immunization against influenza Patient was offered and agreed to have influenza immunization at today's visit which was  given as nurse visit - Flu Vaccine QUAD 6+ mos PF IM (Fluarix Quad PF)  4.. Need for Tdap vaccination Patient was offered and agreed to have tetanus immunization at today's visit - Tdap vaccine greater than or equal to 7yo IM  1. Essential hypertension Patient reports that she did take her lisinopril today however her blood pressure still elevated at  today's visit.  She also reports a recurrent cough and this may be secondary to her lisinopril.  Patient may stop lisinopril.  Patient provided with a prescription for amlodipine 5 mg to start daily at night.  Patient will return to clinic in approximately 4 weeks for blood pressure recheck but should call or return sooner if she has any issues with the medication or any concerns.  Low-sodium/-type diet encouraged along with regular exercise - amLODipine (NORVASC) 5 MG tablet; Take 1 tablet (5 mg total) by mouth daily. To lower blood pressure  Dispense: 30 tablet; Refill: 6  2. Recurrent dry cough Patient reports recurrent dry cough which may be related to her use of lisinopril.  Patient is to stop the use of lisinopril and patient will return to clinic in 4 weeks for reevaluation of blood pressure and if she continues to have cough and is not on lisinopril, she will need further evaluation.  Outpatient Encounter Medications as of 10/09/2018  Medication Sig  . GENVOYA 150-150-200-10 MG TABS tablet TAKE 1 TABLET BY MOUTH EVERY DAY WITH BREAKFAST  . lisinopril (ZESTRIL) 20 MG tablet TAKE 1 TABLET BY MOUTH EVERY DAY  . Study - REPRIEVE [phone] - pitavastatin 4 mg or placebo tablet Take 1 tablet (4 mg total) by mouth daily.  . cyclobenzaprine (FLEXERIL) 10 MG tablet Take 1 tablet (10 mg total) by mouth 2 (two) times daily as needed for muscle spasms. (Patient not taking: Reported on 10/09/2018)  . divalproex (DEPAKOTE ER) 500 MG 24 hr tablet Take 500-1,000 mg by mouth See admin instructions. Take one tablet by mouth in the morning and two tablets at bedtime.   . lidocaine (LIDODERM) 5 % Place 1 patch onto the skin daily. Remove & Discard patch within 12 hours or as directed by MD (Patient not taking: Reported on 10/09/2018)  . nicotine (NICODERM CQ - DOSED IN MG/24 HOURS) 14 mg/24hr patch Place 1 patch (14 mg total) onto the skin daily. (Patient not taking: Reported on 10/09/2018)  . QUEtiapine (SEROQUEL) 100 MG tablet Take 150-300 mg by mouth See admin instructions. Take 150 mg in the morning and 300 mg in the evening.  . sertraline (ZOLOFT) 50 MG tablet Take 50 mg by mouth daily.  Marland Kitchen. zolpidem (AMBIEN) 5 MG tablet Take 5 mg by mouth at bedtime as needed for sleep.   . [DISCONTINUED] GENVOYA 150-150-200-10 MG TABS tablet TAKE 1 TABLET BY MOUTH EVERY DAY WITH BREAKFAST  . [DISCONTINUED] lisinopril (ZESTRIL) 20 MG tablet TAKE 1 TABLET BY MOUTH EVERY DAY   No facility-administered encounter medications on file as of 10/09/2018.    An After Visit Summary was printed and given to the patient.  Follow-up: Return in about 4 weeks (around 11/06/2018) for HTN.  Cain Saupeammie Terrin Imparato, MD

## 2018-10-09 NOTE — Research (Signed)
Patient here for reprieve visit. She has no new concerns or complaints. States she is doing well with adherence to study drug and has had no changes in health status or other medications. She has a cough and runny nose despite answering no to these on screening. She states these issues are chronic for her. Secondary to smoking. She is scheduled next Friday for her [id number] visit.

## 2018-10-09 NOTE — Patient Instructions (Signed)
Hypertension, Adult Hypertension is another name for high blood pressure. High blood pressure forces your heart to work harder to pump blood. This can cause problems over time. There are two numbers in a blood pressure reading. There is a top number (systolic) over a bottom number (diastolic). It is best to have a blood pressure that is below 120/80. Healthy choices can help lower your blood pressure, or you may need medicine to help lower it. What are the causes? The cause of this condition is not known. Some conditions may be related to high blood pressure. What increases the risk?  Smoking.  Having type 2 diabetes mellitus, high cholesterol, or both.  Not getting enough exercise or physical activity.  Being overweight.  Having too much fat, sugar, calories, or salt (sodium) in your diet.  Drinking too much alcohol.  Having long-term (chronic) kidney disease.  Having a family history of high blood pressure.  Age. Risk increases with age.  Race. You may be at higher risk if you are African American.  Gender. Men are at higher risk than women before age 45. After age 65, women are at higher risk than men.  Having obstructive sleep apnea.  Stress. What are the signs or symptoms?  High blood pressure may not cause symptoms. Very high blood pressure (hypertensive crisis) may cause: ? Headache. ? Feelings of worry or nervousness (anxiety). ? Shortness of breath. ? Nosebleed. ? A feeling of being sick to your stomach (nausea). ? Throwing up (vomiting). ? Changes in how you see. ? Very bad chest pain. ? Seizures. How is this treated?  This condition is treated by making healthy lifestyle changes, such as: ? Eating healthy foods. ? Exercising more. ? Drinking less alcohol.  Your health care provider may prescribe medicine if lifestyle changes are not enough to get your blood pressure under control, and if: ? Your top number is above 130. ? Your bottom number is above 80.   Your personal target blood pressure may vary. Follow these instructions at home: Eating and drinking   If told, follow the DASH eating plan. To follow this plan: ? Fill one half of your plate at each meal with fruits and vegetables. ? Fill one fourth of your plate at each meal with whole grains. Whole grains include whole-wheat pasta, brown rice, and whole-grain bread. ? Eat or drink low-fat dairy products, such as skim milk or low-fat yogurt. ? Fill one fourth of your plate at each meal with low-fat (lean) proteins. Low-fat proteins include fish, chicken without skin, eggs, beans, and tofu. ? Avoid fatty meat, cured and processed meat, or chicken with skin. ? Avoid pre-made or processed food.  Eat less than 1,500 mg of salt each day.  Do not drink alcohol if: ? Your doctor tells you not to drink. ? You are pregnant, may be pregnant, or are planning to become pregnant.  If you drink alcohol: ? Limit how much you use to:  0-1 drink a day for women.  0-2 drinks a day for men. ? Be aware of how much alcohol is in your drink. In the [place] one drink equals one 12 oz bottle of beer (355 mL), one 5 oz glass of wine (148 mL), or one 1 oz glass of hard liquor (44 mL). Lifestyle   Work with your doctor to stay at a healthy weight or to lose weight. Ask your doctor what the best weight is for you.  Get at least 30 minutes of exercise most   days of the week. This may include walking, swimming, or biking.  Get at least 30 minutes of exercise that strengthens your muscles (resistance exercise) at least 3 days a week. This may include lifting weights or doing Pilates.  Do not use any products that contain nicotine or tobacco, such as cigarettes, e-cigarettes, and chewing tobacco. If you need help quitting, ask your doctor.  Check your blood pressure at home as told by your doctor.  Keep all follow-up visits as told by your doctor. This is important. Medicines  Take over-the-counter and  prescription medicines only as told by your doctor. Follow directions carefully.  Do not skip doses of blood pressure medicine. The medicine does not work as well if you skip doses. Skipping doses also puts you at risk for problems.  Ask your doctor about side effects or reactions to medicines that you should watch for. Contact a doctor if you:  Think you are having a reaction to the medicine you are taking.  Have headaches that keep coming back (recurring).  Feel dizzy.  Have swelling in your ankles.  Have trouble with your vision. Get help right away if you:  Get a very bad headache.  Start to feel mixed up (confused).  Feel weak or numb.  Feel faint.  Have very bad pain in your: ? Chest. ? Belly (abdomen).  Throw up more than once.  Have trouble breathing. Summary  Hypertension is another name for high blood pressure.  High blood pressure forces your heart to work harder to pump blood.  For most people, a normal blood pressure is less than 120/80.  Making healthy choices can help lower blood pressure. If your blood pressure does not get lower with healthy choices, you may need to take medicine. This information is not intended to replace advice given to you by your health care provider. Make sure you discuss any questions you have with your health care provider. Document Released: 06/26/2007 Document Revised: 09/17/2017 Document Reviewed: 09/17/2017 Elsevier Patient Education  2020 Elsevier Inc.  Managing Your Hypertension Hypertension is commonly called high blood pressure. This is when the force of your blood pressing against the walls of your arteries is too strong. Arteries are blood vessels that carry blood from your heart throughout your body. Hypertension forces the heart to work harder to pump blood, and may cause the arteries to become narrow or stiff. Having untreated or uncontrolled hypertension can cause heart attack, stroke, kidney disease, and other  problems. What are blood pressure readings? A blood pressure reading consists of a higher number over a lower number. Ideally, your blood pressure should be below 120/80. The first ("top") number is called the systolic pressure. It is a measure of the pressure in your arteries as your heart beats. The second ("bottom") number is called the diastolic pressure. It is a measure of the pressure in your arteries as the heart relaxes. What does my blood pressure reading mean? Blood pressure is classified into four stages. Based on your blood pressure reading, your health care provider may use the following stages to determine what type of treatment you need, if any. Systolic pressure and diastolic pressure are measured in a unit called mm Hg. Normal  Systolic pressure: below 120.  Diastolic pressure: below 80. Elevated  Systolic pressure: 120-129.  Diastolic pressure: below 80. Hypertension stage 1  Systolic pressure: 130-139.  Diastolic pressure: 80-89. Hypertension stage 2  Systolic pressure: 140 or above.  Diastolic pressure: 90 or above. What health risks   are associated with hypertension? Managing your hypertension is an important responsibility. Uncontrolled hypertension can lead to:  A heart attack.  A stroke.  A weakened blood vessel (aneurysm).  Heart failure.  Kidney damage.  Eye damage.  Metabolic syndrome.  Memory and concentration problems. What changes can I make to manage my hypertension? Hypertension can be managed by making lifestyle changes and possibly by taking medicines. Your health care provider will help you make a plan to bring your blood pressure within a normal range. Eating and drinking   Eat a diet that is high in fiber and potassium, and low in salt (sodium), added sugar, and fat. An example eating plan is called the DASH (Dietary Approaches to Stop Hypertension) diet. To eat this way: ? Eat plenty of fresh fruits and vegetables. Try to fill half  of your plate at each meal with fruits and vegetables. ? Eat whole grains, such as whole wheat pasta, brown rice, or whole grain bread. Fill about one quarter of your plate with whole grains. ? Eat low-fat diary products. ? Avoid fatty cuts of meat, processed or cured meats, and poultry with skin. Fill about one quarter of your plate with lean proteins such as fish, chicken without skin, beans, eggs, and tofu. ? Avoid premade and processed foods. These tend to be higher in sodium, added sugar, and fat.  Reduce your daily sodium intake. Most people with hypertension should eat less than 1,500 mg of sodium a day.  Limit alcohol intake to no more than 1 drink a day for nonpregnant women and 2 drinks a day for men. One drink equals 12 oz of beer, 5 oz of wine, or 1 oz of hard liquor. Lifestyle  Work with your health care provider to maintain a healthy body weight, or to lose weight. Ask what an ideal weight is for you.  Get at least 30 minutes of exercise that causes your heart to beat faster (aerobic exercise) most days of the week. Activities may include walking, swimming, or biking.  Include exercise to strengthen your muscles (resistance exercise), such as weight lifting, as part of your weekly exercise routine. Try to do these types of exercises for 30 minutes at least 3 days a week.  Do not use any products that contain nicotine or tobacco, such as cigarettes and e-cigarettes. If you need help quitting, ask your health care provider.  Control any long-term (chronic) conditions you have, such as high cholesterol or diabetes. Monitoring  Monitor your blood pressure at home as told by your health care provider. Your personal target blood pressure may vary depending on your medical conditions, your age, and other factors.  Have your blood pressure checked regularly, as often as told by your health care provider. Working with your health care provider  Review all the medicines you take with  your health care provider because there may be side effects or interactions.  Talk with your health care provider about your diet, exercise habits, and other lifestyle factors that may be contributing to hypertension.  Visit your health care provider regularly. Your health care provider can help you create and adjust your plan for managing hypertension. Will I need medicine to control my blood pressure? Your health care provider may prescribe medicine if lifestyle changes are not enough to get your blood pressure under control, and if:  Your systolic blood pressure is 130 or higher.  Your diastolic blood pressure is 80 or higher. Take medicines only as told by your health care   provider. Follow the directions carefully. Blood pressure medicines must be taken as prescribed. The medicine does not work as well when you skip doses. Skipping doses also puts you at risk for problems. Contact a health care provider if:  You think you are having a reaction to medicines you have taken.  You have repeated (recurrent) headaches.  You feel dizzy.  You have swelling in your ankles.  You have trouble with your vision. Get help right away if:  You develop a severe headache or confusion.  You have unusual weakness or numbness, or you feel faint.  You have severe pain in your chest or abdomen.  You vomit repeatedly.  You have trouble breathing. Summary  Hypertension is when the force of blood pumping through your arteries is too strong. If this condition is not controlled, it may put you at risk for serious complications.  Your personal target blood pressure may vary depending on your medical conditions, your age, and other factors. For most people, a normal blood pressure is less than 120/80.  Hypertension is managed by lifestyle changes, medicines, or both. Lifestyle changes include weight loss, eating a healthy, low-sodium diet, exercising more, and limiting alcohol. This information is not  intended to replace advice given to you by your health care provider. Make sure you discuss any questions you have with your health care provider. Document Released: 10/02/2011 Document Revised: 05/01/2018 Document Reviewed: 12/06/2015 Elsevier Patient Education  2020 Elsevier Inc.  

## 2018-10-13 ENCOUNTER — Encounter (INDEPENDENT_AMBULATORY_CARE_PROVIDER_SITE_OTHER): Payer: Self-pay | Admitting: *Deleted

## 2018-10-13 DIAGNOSIS — Z006 Encounter for examination for normal comparison and control in clinical research program: Secondary | ICD-10-CM

## 2018-10-13 NOTE — Research (Signed)
Daisy Salinas is here for her [id number] (Reprieve Substudy) month 36 visit. BP elevated today, 148/99. No new complaints verbalized. States that PCP changed her BP medication last week. She is to follow-up with PCP in 4 weeks for BP check. Next study visit scheduled for 01/29/19.

## 2019-01-28 ENCOUNTER — Other Ambulatory Visit: Payer: Self-pay | Admitting: Internal Medicine

## 2019-01-28 DIAGNOSIS — B20 Human immunodeficiency virus [HIV] disease: Secondary | ICD-10-CM

## 2019-01-29 ENCOUNTER — Encounter: Payer: Self-pay | Admitting: *Deleted

## 2019-02-02 ENCOUNTER — Other Ambulatory Visit: Payer: Self-pay

## 2019-02-02 ENCOUNTER — Encounter (INDEPENDENT_AMBULATORY_CARE_PROVIDER_SITE_OTHER): Payer: Self-pay | Admitting: *Deleted

## 2019-02-02 VITALS — BP 131/81 | HR 73 | Temp 99.3°F | Wt 160.5 lb

## 2019-02-02 DIAGNOSIS — Z006 Encounter for examination for normal comparison and control in clinical research program: Secondary | ICD-10-CM

## 2019-02-02 NOTE — Research (Signed)
Daisy Salinas was here for her month 40 visit for Reprieve. She deneis any new problems or concerns. She says her adherence is excellent with her ARVS and her study med. She will be returning in May for the next Visit.

## 2019-02-10 ENCOUNTER — Other Ambulatory Visit: Payer: Self-pay

## 2019-02-10 ENCOUNTER — Ambulatory Visit: Payer: Self-pay

## 2019-03-18 ENCOUNTER — Encounter: Payer: Self-pay | Admitting: Internal Medicine

## 2019-05-17 ENCOUNTER — Other Ambulatory Visit: Payer: Self-pay

## 2019-05-31 ENCOUNTER — Other Ambulatory Visit: Payer: Self-pay

## 2019-05-31 ENCOUNTER — Ambulatory Visit (INDEPENDENT_AMBULATORY_CARE_PROVIDER_SITE_OTHER): Payer: Self-pay | Admitting: Internal Medicine

## 2019-05-31 ENCOUNTER — Encounter (INDEPENDENT_AMBULATORY_CARE_PROVIDER_SITE_OTHER): Payer: Self-pay | Admitting: *Deleted

## 2019-05-31 VITALS — BP 129/82 | HR 71 | Temp 98.4°F | Wt 153.2 lb

## 2019-05-31 DIAGNOSIS — Z006 Encounter for examination for normal comparison and control in clinical research program: Secondary | ICD-10-CM

## 2019-05-31 DIAGNOSIS — F4321 Adjustment disorder with depressed mood: Secondary | ICD-10-CM | POA: Insufficient documentation

## 2019-05-31 NOTE — Assessment & Plan Note (Signed)
Not unexpectedly, she is suffering acute grief following the death of her husband.  She is willing to see our behavioral health counselor.  She will follow-up here with me in 3 to 4 weeks.

## 2019-05-31 NOTE — Progress Notes (Signed)
Patient Active Problem List   Diagnosis Date Noted  . Grief reaction 05/31/2019    Priority: High  . Chronic hepatitis C without hepatic coma (HCC) 09/08/2007    Priority: High  . Human immunodeficiency virus (HIV) disease (HCC) 10/29/2005    Priority: High  . History of latent tuberculosis 02/25/2018  . Syncope 08/20/2017  . Bipolar affective disorder (HCC) 08/20/2017  . DJD (degenerative joint disease) 03/12/2016  . Right hip pain 08/08/2015  . Cocaine abuse (HCC) 11/30/2009  . BRONCHITIS, CHRONIC 01/03/2009  . CONSTIPATION, CHRONIC 05/09/2008  . DE QUERVAIN'S TENOSYNOVITIS, LEFT WRIST 05/09/2008  . HYSTERECTOMY, HX OF 05/13/2006  . Depression 05/06/2006  . SYPHILIS, LATE, LATENT 10/29/2005  . DYSLIPIDEMIA 10/29/2005  . CIGARETTE SMOKER 10/29/2005  . Essential hypertension 10/29/2005  . PROLAPSE, VAGINAL WALL NOS 10/29/2005  . MENORRHAGIA 10/29/2005  . INSOMNIA 10/29/2005    Patient's Medications  New Prescriptions   No medications on file  Previous Medications   AMLODIPINE (NORVASC) 5 MG TABLET    Take 1 tablet (5 mg total) by mouth daily. To lower blood pressure   CYCLOBENZAPRINE (FLEXERIL) 10 MG TABLET    Take 1 tablet (10 mg total) by mouth 2 (two) times daily as needed for muscle spasms.   DIVALPROEX (DEPAKOTE ER) 500 MG 24 HR TABLET    Take 500-1,000 mg by mouth See admin instructions. Take one tablet by mouth in the morning and two tablets at bedtime.   GENVOYA 150-150-200-10 MG TABS TABLET    TAKE 1 TABLET BY MOUTH EVERY DAY WITH BREAKFAST   LIDOCAINE (LIDODERM) 5 %    Place 1 patch onto the skin daily. Remove & Discard patch within 12 hours or as directed by MD   NICOTINE (NICODERM CQ - DOSED IN MG/24 HOURS) 14 MG/24HR PATCH    Place 1 patch (14 mg total) onto the skin daily.   QUETIAPINE (SEROQUEL) 100 MG TABLET    Take 150-300 mg by mouth See admin instructions. Take 150 mg in the morning and 300 mg in the evening.   SERTRALINE (ZOLOFT) 50 MG TABLET     Take 50 mg by mouth daily.   STUDY - REPRIEVE [phone] - PITAVASTATIN 4 MG OR PLACEBO TABLET    Take 1 tablet (4 mg total) by mouth daily.   ZOLPIDEM (AMBIEN) 5 MG TABLET    Take 5 mg by mouth at bedtime as needed for sleep.   Modified Medications   No medications on file  Discontinued Medications   No medications on file    Subjective: Daisy Salinas is in for her routine HIV follow-up appointment today.  As she was being brought she informed us that her husband Daisy Salinas had died on 05-31-2019.  She told me that his cancer had spread to his brain and he became progressively weak.  To the emergency department they said that they could not do anything for him.  He got about 3 weeks later at home.  She said that he has been suffering.  She tells me that there was a beautiful service for him with lots of pictures of them together.  She told me that "I just want to die."  She told me that she would never harm herself but she just wanted him to come back and take her.  She has been taking half a tablet of his remaining Seroquel to help her sleep at night.  She says that she has not been missing doses  of her Genvoya and has not used any illicit drugs.  She is living with her daughter.  There are no guns in the house.  Review of Systems: Review of Systems  Psychiatric/Behavioral: Positive for depression. Negative for substance abuse.    Past Medical History:  Diagnosis Date  . Asthma   . Bipolar 1 disorder (HCC) 2009  . Depression   . GERD (gastroesophageal reflux disease)   . HIV (human immunodeficiency virus infection) (HCC)   . Hypertension   . TB (pulmonary tuberculosis) 1989   was exposed and treated  . UTI (urinary tract infection) 1997    Social History   Tobacco Use  . Smoking status: Current Every Day Smoker    Packs/day: 0.50    Years: 45.00    Pack years: 22.50    Types: Cigarettes  . Smokeless tobacco: Never Used  . Tobacco comment: wanting "some cinnamon" to help her stop    Substance Use Topics  . Alcohol use: Yes    Alcohol/week: 65.0 standard drinks    Types: 1 Cans of beer, 64 Standard drinks or equivalent per week    Comment: occ  . Drug use: Yes    Frequency: 4.0 times per week    Types: "Crack" cocaine, Cocaine    Comment: last used last night about 930pm    Family History  Problem Relation Age of Onset  . Coronary artery disease Mother   . Leukemia Father     No Known Allergies  Health Maintenance  Topic Date Due  . COVID-19 Vaccine (1) Never done  . PAP SMEAR-Modifier  03/24/2009  . MAMMOGRAM  Never done  . COLONOSCOPY  06/23/2018  . INFLUENZA VACCINE  08/22/2019  . TETANUS/TDAP  10/08/2028  . Hepatitis C Screening  Completed  . HIV Screening  Completed    Objective:  There were no vitals filed for this visit. There is no height or weight on file to calculate BMI.  Physical Exam Constitutional:      Comments: She was tearful throughout the exam but was able to smile when talking about the service and all the wonderful pictures taken over the course of their marriage.     Lab Results Lab Results  Component Value Date   WBC 6.0 02/25/2018   HGB 11.9 02/25/2018   HCT 34.9 (L) 02/25/2018   MCV 100.3 (H) 02/25/2018   PLT 112 (L) 02/25/2018    Lab Results  Component Value Date   CREATININE 0.74 02/25/2018   BUN 15 02/25/2018   NA 141 02/25/2018   K 3.8 02/25/2018   CL 107 02/25/2018   CO2 26 02/25/2018    Lab Results  Component Value Date   ALT 6 02/25/2018   AST 23 02/25/2018   ALKPHOS 42 08/20/2017   BILITOT 0.3 02/25/2018    Lab Results  Component Value Date   CHOL 152 02/26/2016   HDL 30 (L) 02/26/2016   LDLCALC 56 02/26/2016   TRIG 331 (H) 02/26/2016   CHOLHDL 5.1 (H) 02/26/2016   Lab Results  Component Value Date   LABRPR NON-REACTIVE 02/25/2018   RPRTITER 1:1 07/19/2015   HIV 1 RNA Quant (copies/mL)  Date Value  08/25/2018 <20 NOT DETECTED  02/25/2018 <20 DETECTED (A)  05/08/2017 26 (H)    CD4 T Cell Abs (/uL)  Date Value  08/25/2018 1,662  02/25/2018 1,600  05/08/2017 1,200     Problem List Items Addressed This Visit      High   Grief reaction  Not unexpectedly, she is suffering acute grief following the death of her husband.  She is willing to see our behavioral health counselor.  She will follow-up here with me in 3 to 4 weeks.           Michel Bickers, MD [facility] [phone] pager   [phone] cell 05/31/2019, 11:18 AM

## 2019-05-31 NOTE — Research (Signed)
Daisy Salinas is here for her month 22 Reprieve visit. Seen by Dr. Orvan Falconer prior to her research visit today. She notified us that her husband passed away from cancer last month. She is currently living with her daughter and states that she has a good support system around her. She is scheduled to return next week to meet with Marylu Lund, behavioral health counselor. Emotional support provided to her today. Verbalized excellent adherence with her study medication. Denied any muscle aches or weakness.  She will return in September for her next study visit.

## 2019-06-02 ENCOUNTER — Other Ambulatory Visit: Payer: Self-pay

## 2019-06-02 DIAGNOSIS — B20 Human immunodeficiency virus [HIV] disease: Secondary | ICD-10-CM

## 2019-06-03 LAB — T-HELPER CELL (CD4) - (RCID CLINIC ONLY)
CD4 % Helper T Cell: 45 % (ref 33–65)
CD4 T Cell Abs: 1532 /uL (ref 400–1790)

## 2019-06-04 LAB — RPR: RPR Ser Ql: REACTIVE — AB

## 2019-06-04 LAB — COMPREHENSIVE METABOLIC PANEL
AG Ratio: 1.3 (calc) (ref 1.0–2.5)
ALT: 6 U/L (ref 6–29)
AST: 19 U/L (ref 10–35)
Albumin: 4.3 g/dL (ref 3.6–5.1)
Alkaline phosphatase (APISO): 79 U/L (ref 37–153)
BUN: 10 mg/dL (ref 7–25)
CO2: 27 mmol/L (ref 20–32)
Calcium: 9.5 mg/dL (ref 8.6–10.4)
Chloride: 106 mmol/L (ref 98–110)
Creat: 0.73 mg/dL (ref 0.50–1.05)
Globulin: 3.2 g/dL (calc) (ref 1.9–3.7)
Glucose, Bld: 99 mg/dL (ref 65–99)
Potassium: 3.6 mmol/L (ref 3.5–5.3)
Sodium: 140 mmol/L (ref 135–146)
Total Bilirubin: 0.3 mg/dL (ref 0.2–1.2)
Total Protein: 7.5 g/dL (ref 6.1–8.1)

## 2019-06-04 LAB — CBC
HCT: 39.2 % (ref 35.0–45.0)
Hemoglobin: 13.6 g/dL (ref 11.7–15.5)
MCH: 34.2 pg — ABNORMAL HIGH (ref 27.0–33.0)
MCHC: 34.7 g/dL (ref 32.0–36.0)
MCV: 98.5 fL (ref 80.0–100.0)
MPV: 11.4 fL (ref 7.5–12.5)
Platelets: 159 10*3/uL (ref 140–400)
RBC: 3.98 10*6/uL (ref 3.80–5.10)
RDW: 13.1 % (ref 11.0–15.0)
WBC: 7.9 10*3/uL (ref 3.8–10.8)

## 2019-06-04 LAB — LIPID PANEL
Cholesterol: 157 mg/dL (ref <200)
HDL: 38 mg/dL — ABNORMAL LOW (ref >=50)
LDL Cholesterol (Calc): 92 mg/dL (calc)
Non-HDL Cholesterol (Calc): 119 mg/dL (calc) (ref <130)
Total CHOL/HDL Ratio: 4.1 (calc) (ref <5.0)
Triglycerides: 170 mg/dL — ABNORMAL HIGH (ref <150)

## 2019-06-04 LAB — HIV-1 RNA QUANT-NO REFLEX-BLD
HIV 1 RNA Quant: <20 copies/mL
HIV-1 RNA Quant, Log: <1.3 Log copies/mL

## 2019-06-04 LAB — FLUORESCENT TREPONEMAL AB(FTA)-IGG-BLD: Fluorescent Treponemal ABS: REACTIVE — AB

## 2019-06-04 LAB — RPR TITER: RPR Titer: 1:2 {titer} — ABNORMAL HIGH

## 2019-06-08 ENCOUNTER — Ambulatory Visit: Payer: Self-pay

## 2019-06-08 ENCOUNTER — Telehealth: Payer: Self-pay | Admitting: General Practice

## 2019-06-08 DIAGNOSIS — I10 Essential (primary) hypertension: Secondary | ICD-10-CM

## 2019-06-08 NOTE — Telephone Encounter (Signed)
Pharmacy called and requested for listed medication tot be refilled and sent to the pharmacy. Please follow up at your earliest convenience.   amLODipine (NORVASC) 5 MG tablet [[id number]] [id number] [place], [place] - [address]  [address] [place], [place] [address]

## 2019-06-10 MED ORDER — AMLODIPINE BESYLATE 5 MG PO TABS: 5.0000 mg | ORAL_TABLET | Freq: Every day | ORAL | 0 refills | Status: DC

## 2019-06-10 NOTE — Telephone Encounter (Signed)
Pt needs appointment. I have provided her with a 30 day supply.

## 2019-06-28 ENCOUNTER — Ambulatory Visit: Payer: Self-pay | Admitting: Internal Medicine

## 2019-08-02 ENCOUNTER — Encounter: Payer: Self-pay | Admitting: Internal Medicine

## 2019-08-02 ENCOUNTER — Other Ambulatory Visit: Payer: Self-pay

## 2019-08-02 ENCOUNTER — Ambulatory Visit (INDEPENDENT_AMBULATORY_CARE_PROVIDER_SITE_OTHER): Payer: Self-pay | Admitting: Internal Medicine

## 2019-08-02 DIAGNOSIS — B20 Human immunodeficiency virus [HIV] disease: Secondary | ICD-10-CM

## 2019-08-02 NOTE — Assessment & Plan Note (Signed)
Her infection remains under excellent, long-term control.  She will continue Genvoya and follow-up after lab work in 1 year. 

## 2019-08-02 NOTE — Progress Notes (Signed)
Patient Active Problem List   Diagnosis Date Noted  . Grief reaction 05/31/2019    Priority: High  . Chronic hepatitis C without hepatic coma (HCC) 09/08/2007    Priority: High  . Human immunodeficiency virus (HIV) disease (HCC) 10/29/2005    Priority: High  . History of latent tuberculosis 02/25/2018  . Syncope 08/20/2017  . Bipolar affective disorder (HCC) 08/20/2017  . DJD (degenerative joint disease) 03/12/2016  . Right hip pain 08/08/2015  . Cocaine abuse (HCC) 11/30/2009  . BRONCHITIS, CHRONIC 01/03/2009  . CONSTIPATION, CHRONIC 05/09/2008  . DE QUERVAIN'S TENOSYNOVITIS, LEFT WRIST 05/09/2008  . HYSTERECTOMY, HX OF 05/13/2006  . Depression 05/06/2006  . SYPHILIS, LATE, LATENT 10/29/2005  . DYSLIPIDEMIA 10/29/2005  . CIGARETTE SMOKER 10/29/2005  . Essential hypertension 10/29/2005  . PROLAPSE, VAGINAL WALL NOS 10/29/2005  . MENORRHAGIA 10/29/2005  . INSOMNIA 10/29/2005    Patient's Medications  New Prescriptions   No medications on file  Previous Medications   AMLODIPINE (NORVASC) 5 MG TABLET    Take 1 tablet (5 mg total) by mouth daily. Must have office visit for refills   CYCLOBENZAPRINE (FLEXERIL) 10 MG TABLET    Take 1 tablet (10 mg total) by mouth 2 (two) times daily as needed for muscle spasms.   DIVALPROEX (DEPAKOTE ER) 500 MG 24 HR TABLET    Take 500-1,000 mg by mouth See admin instructions. Take one tablet by mouth in the morning and two tablets at bedtime.   GENVOYA 150-150-200-10 MG TABS TABLET    TAKE 1 TABLET BY MOUTH EVERY DAY WITH BREAKFAST   LIDOCAINE (LIDODERM) 5 %    Place 1 patch onto the skin daily. Remove & Discard patch within 12 hours or as directed by MD   NICOTINE (NICODERM CQ - DOSED IN MG/24 HOURS) 14 MG/24HR PATCH    Place 1 patch (14 mg total) onto the skin daily.   QUETIAPINE (SEROQUEL) 100 MG TABLET    Take 150-300 mg by mouth See admin instructions. Take 150 mg in the morning and 300 mg in the evening.   SERTRALINE (ZOLOFT)  50 MG TABLET    Take 50 mg by mouth daily.   STUDY - REPRIEVE [phone] - PITAVASTATIN 4 MG OR PLACEBO TABLET    Take 1 tablet (4 mg total) by mouth daily.   ZOLPIDEM (AMBIEN) 5 MG TABLET    Take 5 mg by mouth at bedtime as needed for sleep.   Modified Medications   No medications on file  Discontinued Medications   No medications on file    Subjective: Crescentia is in for her routine follow-up visit.  She says that she is doing much better than when I last saw her shortly after her husband, [name], died.  She has not had any problems obtaining, taking or tolerating her Genvoya and does not recall missing any doses.  She received the [employer] Covid vaccine without any side effects.  Review of Systems: Review of Systems  Constitutional: Negative for fever.  Respiratory: Negative for cough.   Cardiovascular: Negative for chest pain.  Gastrointestinal: Negative for abdominal pain, diarrhea, nausea and vomiting.    Past Medical History:  Diagnosis Date  . Asthma   . Bipolar 1 disorder (HCC) 2009  . Depression   . GERD (gastroesophageal reflux disease)   . HIV (human immunodeficiency virus infection) (HCC)   . Hypertension   . TB (pulmonary tuberculosis) 1989   was exposed and treated  .  UTI (urinary tract infection) 1997    Social History   Tobacco Use  . Smoking status: Current Every Day Smoker    Packs/day: 0.70    Years: 45.00    Pack years: 31.50    Types: Cigarettes  . Smokeless tobacco: Never Used  . Tobacco comment: wanting "some cinnamon" to help her stop  Vaping Use  . Vaping Use: Never used  Substance Use Topics  . Alcohol use: Yes    Alcohol/week: 2.0 standard drinks    Types: 1 Cans of beer, 1 Standard drinks or equivalent per week    Comment: occ  . Drug use: Yes    Frequency: 1.0 times per week    Types: Cocaine    Comment: Occassional use    Family History  Problem Relation Age of Onset  . Coronary artery disease Mother   . Leukemia Father      No Known Allergies  Health Maintenance  Topic Date Due  . COVID-19 Vaccine (1) Never done  . PAP SMEAR-Modifier  03/24/2009  . MAMMOGRAM  Never done  . COLONOSCOPY  06/23/2018  . INFLUENZA VACCINE  08/22/2019  . TETANUS/TDAP  10/08/2028  . Hepatitis C Screening  Completed  . HIV Screening  Completed    Objective:  Vitals:   08/02/19 1445  BP: (!) 148/87  Pulse: 82  SpO2: 96%   There is no height or weight on file to calculate BMI.  Physical Exam Constitutional:      Comments: She is smiling and in good spirits.  Cardiovascular:     Rate and Rhythm: Normal rate and regular rhythm.     Heart sounds: No murmur heard.   Pulmonary:     Effort: Pulmonary effort is normal.     Breath sounds: Normal breath sounds.  Psychiatric:        Mood and Affect: Mood normal.     Lab Results Lab Results  Component Value Date   WBC 7.9 06/02/2019   HGB 13.6 06/02/2019   HCT 39.2 06/02/2019   MCV 98.5 06/02/2019   PLT 159 06/02/2019    Lab Results  Component Value Date   CREATININE 0.73 06/02/2019   BUN 10 06/02/2019   NA 140 06/02/2019   K 3.6 06/02/2019   CL 106 06/02/2019   CO2 27 06/02/2019    Lab Results  Component Value Date   ALT 6 06/02/2019   AST 19 06/02/2019   ALKPHOS 42 08/20/2017   BILITOT 0.3 06/02/2019    Lab Results  Component Value Date   CHOL 157 06/02/2019   HDL 38 (L) 06/02/2019   LDLCALC 92 06/02/2019   TRIG 170 (H) 06/02/2019   CHOLHDL 4.1 06/02/2019   Lab Results  Component Value Date   LABRPR REACTIVE (A) 06/02/2019   RPRTITER 1:2 (H) 06/02/2019   HIV 1 RNA Quant (copies/mL)  Date Value  06/02/2019 <20 NOT DETECTED  08/25/2018 <20 NOT DETECTED  02/25/2018 <20 DETECTED (A)   CD4 T Cell Abs (/uL)  Date Value  06/02/2019 1,532  08/25/2018 1,662  02/25/2018 1,600     Problem List Items Addressed This Visit      High   Human immunodeficiency virus (HIV) disease (HCC)    Her infection remains under excellent, long-term  control.  She will continue Genvoya and follow-up after lab work in 1 year.      Relevant Orders   CBC   T-helper cell (CD4)- ([facility] clinic only)   Comprehensive metabolic panel  Lipid panel   RPR   HIV-1 RNA quant-no reflex-bld        Cliffton Asters, MD [facility] [phone] pager   [phone] cell 08/02/2019, 3:04 PM

## 2019-08-03 ENCOUNTER — Other Ambulatory Visit: Payer: Self-pay | Admitting: Internal Medicine

## 2019-08-03 DIAGNOSIS — B20 Human immunodeficiency virus [HIV] disease: Secondary | ICD-10-CM

## 2019-09-03 ENCOUNTER — Other Ambulatory Visit: Payer: Self-pay

## 2019-09-03 ENCOUNTER — Ambulatory Visit: Payer: Self-pay

## 2019-09-08 ENCOUNTER — Encounter: Payer: Self-pay | Admitting: Internal Medicine

## 2019-10-13 ENCOUNTER — Encounter (INDEPENDENT_AMBULATORY_CARE_PROVIDER_SITE_OTHER): Payer: Self-pay | Admitting: *Deleted

## 2019-10-13 ENCOUNTER — Other Ambulatory Visit: Payer: Self-pay

## 2019-10-13 VITALS — BP 144/88 | HR 61 | Temp 98.7°F | Wt 148.2 lb

## 2019-10-13 DIAGNOSIS — Z006 Encounter for examination for normal comparison and control in clinical research program: Secondary | ICD-10-CM

## 2019-10-13 NOTE — Research (Signed)
Daisy Salinas was here today for her month 48 visit for Reprieve, A Randomized Trial to Prevent Vascular Events in HIV (study drug is Pitavastatin 4mg  or placebo) . She was not fasting though and will return tomoorw to get fasting labs for the study and to screen for the Hep B vaccine study.  She has not been tested since she was vaccinated in 2005, so has a high likelihood of having a negative antibody.   She was very tearful today, missing her husband who passed in April this year from prostate cancer. She is interested in grief counseling and we got her scheduled to see May tomorrow.   She said she has not been taking any of her other meds because she hasn't been able to get in to see a PCP due to lack of funds. I got her scheduled with IM next week and she will use one of the gift cards from today to pay for the $25 visit charge for them. She thinks that she does need her seroquel and depakote which she hasn't been on for a while.

## 2019-10-14 ENCOUNTER — Encounter (INDEPENDENT_AMBULATORY_CARE_PROVIDER_SITE_OTHER): Payer: Self-pay | Admitting: Physician Assistant

## 2019-10-14 ENCOUNTER — Ambulatory Visit: Payer: Self-pay

## 2019-10-14 VITALS — BP 154/79 | HR 60 | Temp 98.4°F | Wt 148.0 lb

## 2019-10-14 DIAGNOSIS — Z006 Encounter for examination for normal comparison and control in clinical research program: Secondary | ICD-10-CM

## 2019-10-14 LAB — CBC AND DIFFERENTIAL
HCT: 40 (ref 36–46)
Hemoglobin: 14 (ref 12.0–16.0)
Neutrophils Absolute: 3.7
Platelets: 154 (ref 150–399)
WBC: 7.9

## 2019-10-14 LAB — CD4/CD8 (T-HELPER/T-SUPPRESSOR CELL)
CD4 Count: 1624
CD8 % Suppressor T Cell: 36.6
CD8 T Cell Abs: 1354

## 2019-10-14 LAB — CBC: RBC: 4.22 (ref 3.87–5.11)

## 2019-10-14 LAB — HEMOGLOBIN A1C: Hemoglobin A1C: 5.8

## 2019-10-14 NOTE — Progress Notes (Unsigned)
   Subjective:    Patient ID: Daisy Salinas, female    DOB: 1960/04/04, 59 y.o.   MRN: [id number]  Chief Complaint  Patient presents with  . Research Consent     [id number] SCR     HPI:  Daisy Salinas is a 59 y.o. female presents today for screening CPE for [phone] study.    No Known Allergies        Review of Systems    Objective:    BP (!) 154/79   Pulse 60   Temp 98.4 F (36.9 C) (Oral)   Wt 148 lb (67.1 kg)   BMI 29.89 kg/m  Nursing note and vital signs reviewed.  Physical Exam Constitutional:      General: She is not in acute distress.    Appearance: Normal appearance. She is not ill-appearing, toxic-appearing or diaphoretic.  HENT:     Head: Normocephalic and atraumatic.     Right Ear: Tympanic membrane normal.     Left Ear: Tympanic membrane normal.     Nose: Nose normal.     Mouth/Throat:     Mouth: Mucous membranes are dry.     Pharynx: Oropharynx is clear. No oropharyngeal exudate or posterior oropharyngeal erythema.  Eyes:     Extraocular Movements: Extraocular movements intact.     Conjunctiva/sclera: Conjunctivae normal.     Pupils: Pupils are equal, round, and reactive to light.  Cardiovascular:     Rate and Rhythm: Normal rate and regular rhythm.     Pulses: Normal pulses.     Heart sounds: Normal heart sounds. No murmur heard.  No gallop.   Pulmonary:     Effort: Pulmonary effort is normal.     Breath sounds: Normal breath sounds.  Abdominal:     General: Abdomen is flat. Bowel sounds are normal. There is no distension.     Palpations: Abdomen is soft. There is no mass.     Tenderness: There is no abdominal tenderness. There is no guarding or rebound.     Hernia: No hernia is present.  Musculoskeletal:        General: No swelling or tenderness. Normal range of motion.     Cervical back: Normal range of motion and neck supple. No tenderness.     Right lower leg: No edema.     Left lower leg: No edema.  Lymphadenopathy:     Cervical:  No cervical adenopathy.  Skin:    General: Skin is warm and dry.     Capillary Refill: Capillary refill takes less than 2 seconds.  Neurological:     General: No focal deficit present.     Mental Status: She is alert and oriented to person, place, and time.     Gait: Gait normal.     Deep Tendon Reflexes: Reflexes normal.

## 2019-10-15 LAB — COMPREHENSIVE METABOLIC PANEL
AG Ratio: 1.3 (calc) (ref 1.0–2.5)
ALT: 5 U/L — ABNORMAL LOW (ref 6–29)
AST: 17 U/L (ref 10–35)
Albumin: 4.7 g/dL (ref 3.6–5.1)
Alkaline phosphatase (APISO): 79 U/L (ref 37–153)
BUN: 13 mg/dL (ref 7–25)
CO2: 28 mmol/L (ref 20–32)
Calcium: 9.9 mg/dL (ref 8.6–10.4)
Chloride: 107 mmol/L (ref 98–110)
Creat: 0.79 mg/dL (ref 0.50–0.99)
Globulin: 3.6 g/dL (calc) (ref 1.9–3.7)
Glucose, Bld: 100 mg/dL — ABNORMAL HIGH (ref 65–99)
Potassium: 3.5 mmol/L (ref 3.5–5.3)
Sodium: 139 mmol/L (ref 135–146)
Total Bilirubin: 0.7 mg/dL (ref 0.2–1.2)
Total Protein: 8.3 g/dL — ABNORMAL HIGH (ref 6.1–8.1)

## 2019-10-15 LAB — HEPATITIS B SURFACE ANTIGEN: Hepatitis B Surface Ag: NONREACTIVE

## 2019-10-15 LAB — HIV-1/2 AB - DIFFERENTIATION
HIV-1 antibody: POSITIVE — AB
HIV-2 Ab: NEGATIVE

## 2019-10-15 LAB — PROTIME-INR
INR: 1.1
Prothrombin Time: 11.5 s (ref 9.0–11.5)

## 2019-10-15 LAB — HEPATITIS B SURFACE ANTIBODY,QUALITATIVE: Hep B S Ab: NONREACTIVE

## 2019-10-15 LAB — BILIRUBIN, DIRECT: Bilirubin, Direct: 0.2 mg/dL (ref 0.0–0.2)

## 2019-10-15 LAB — HEPATITIS B CORE ANTIBODY, TOTAL: Hep B Core Total Ab: REACTIVE — AB

## 2019-10-15 LAB — PHOSPHORUS: Phosphorus: 3.9 mg/dL (ref 2.5–4.5)

## 2019-10-15 LAB — HIV ANTIBODY (ROUTINE TESTING W REFLEX): HIV 1&2 Ab, 4th Generation: REACTIVE — AB

## 2019-10-15 LAB — CK: Total CK: 98 U/L (ref 29–143)

## 2019-10-19 ENCOUNTER — Encounter: Payer: Self-pay | Admitting: Internal Medicine

## 2019-10-26 ENCOUNTER — Ambulatory Visit: Payer: Self-pay

## 2019-11-10 ENCOUNTER — Encounter: Payer: Self-pay | Admitting: Infectious Disease

## 2019-11-10 LAB — CD4/CD8 (T-HELPER/T-SUPPRESSOR CELL): CD4 % Helper T Cell: 43.9

## 2020-01-31 ENCOUNTER — Other Ambulatory Visit: Payer: Self-pay

## 2020-01-31 DIAGNOSIS — Z20822 Contact with and (suspected) exposure to covid-19: Secondary | ICD-10-CM

## 2020-02-02 ENCOUNTER — Other Ambulatory Visit: Payer: Self-pay

## 2020-02-02 ENCOUNTER — Encounter (INDEPENDENT_AMBULATORY_CARE_PROVIDER_SITE_OTHER): Payer: Self-pay | Admitting: *Deleted

## 2020-02-02 VITALS — BP 171/94 | HR 69 | Temp 97.9°F | Wt 138.0 lb

## 2020-02-02 DIAGNOSIS — Z006 Encounter for examination for normal comparison and control in clinical research program: Secondary | ICD-10-CM

## 2020-02-02 NOTE — Research (Signed)
Daisy Salinas here today for month 52 Reprieve visit, A Randomized Trial to Prevent Vascular Events in HIV.  BP elevated today, 171/94. Denied headache, dizziness or visual changes. Has been off her BP medication she states "for a while". States that she was unable to afford medication. Discussed with Dr. Orvan Falconer. Needs to follow up with PCP. She does have RW/UMAP. Has been seen in the past at [facility] but states she cannot afford visits so she has not been in a while. Gave her information about contacting the financial counselor at [facility] to see if she qualifies for the [place] card to assist with visit cost. She agreed to call. Rechecked he BP after 5 minute rest , BP 165/89. She became very tearful. Missing her husband who passed away last 05-04-22. She is very angry that he is gone. States that she has no appetite and has been losing weight. She has lost 10lbs since her last study visit in September. Repeatedly stating that she "does not care" about herself or her life. Denied any thought of self harm or suicidal thoughts. States that she has attended group counseling but it did not help. She is agreeable to seeing Marylu Lund here in the clinic. Appointment scheduled for next week. She is scheduled to return in February to see Dr. Orvan Falconer and next study visit is scheduled for May.

## 2020-02-03 LAB — NOVEL CORONAVIRUS, NAA: SARS-CoV-2, NAA: NOT DETECTED

## 2020-02-03 LAB — SARS-COV-2, NAA 2 DAY TAT

## 2020-02-08 ENCOUNTER — Ambulatory Visit: Payer: Self-pay

## 2020-02-08 ENCOUNTER — Other Ambulatory Visit: Payer: Self-pay

## 2020-02-09 ENCOUNTER — Other Ambulatory Visit: Payer: Self-pay

## 2020-02-10 ENCOUNTER — Other Ambulatory Visit: Payer: Self-pay

## 2020-02-10 ENCOUNTER — Ambulatory Visit: Payer: Self-pay

## 2020-02-23 ENCOUNTER — Encounter: Payer: Self-pay | Admitting: Internal Medicine

## 2020-03-02 ENCOUNTER — Other Ambulatory Visit: Payer: Self-pay | Admitting: Internal Medicine

## 2020-03-02 DIAGNOSIS — B20 Human immunodeficiency virus [HIV] disease: Secondary | ICD-10-CM

## 2020-04-05 ENCOUNTER — Other Ambulatory Visit: Payer: Self-pay

## 2020-04-05 ENCOUNTER — Ambulatory Visit (INDEPENDENT_AMBULATORY_CARE_PROVIDER_SITE_OTHER): Payer: Self-pay | Admitting: Internal Medicine

## 2020-04-05 ENCOUNTER — Ambulatory Visit (HOSPITAL_COMMUNITY)
Admission: EM | Admit: 2020-04-05 | Discharge: 2020-04-07 | Disposition: A | Discharge status: Home / Self Care | Payer: No Payment, Other | Source: Other Acute Inpatient Hospital | Attending: Psychiatry | Admitting: Psychiatry

## 2020-04-05 DIAGNOSIS — Z20822 Contact with and (suspected) exposure to covid-19: Secondary | ICD-10-CM | POA: Insufficient documentation

## 2020-04-05 DIAGNOSIS — B20 Human immunodeficiency virus [HIV] disease: Secondary | ICD-10-CM

## 2020-04-05 DIAGNOSIS — Z560 Unemployment, unspecified: Secondary | ICD-10-CM | POA: Insufficient documentation

## 2020-04-05 DIAGNOSIS — F3342 Major depressive disorder, recurrent, in full remission: Secondary | ICD-10-CM

## 2020-04-05 DIAGNOSIS — F332 Major depressive disorder, recurrent severe without psychotic features: Secondary | ICD-10-CM | POA: Insufficient documentation

## 2020-04-05 DIAGNOSIS — F1410 Cocaine abuse, uncomplicated: Secondary | ICD-10-CM

## 2020-04-05 DIAGNOSIS — R45851 Suicidal ideations: Secondary | ICD-10-CM | POA: Insufficient documentation

## 2020-04-05 DIAGNOSIS — Z91128 Patient's intentional underdosing of medication regimen for other reason: Secondary | ICD-10-CM | POA: Insufficient documentation

## 2020-04-05 DIAGNOSIS — Z634 Disappearance and death of family member: Secondary | ICD-10-CM | POA: Insufficient documentation

## 2020-04-05 DIAGNOSIS — F17210 Nicotine dependence, cigarettes, uncomplicated: Secondary | ICD-10-CM | POA: Insufficient documentation

## 2020-04-05 DIAGNOSIS — T50916A Underdosing of multiple unspecified drugs, medicaments and biological substances, initial encounter: Secondary | ICD-10-CM | POA: Insufficient documentation

## 2020-04-05 DIAGNOSIS — Z9151 Personal history of suicidal behavior: Secondary | ICD-10-CM | POA: Insufficient documentation

## 2020-04-05 DIAGNOSIS — F4321 Adjustment disorder with depressed mood: Secondary | ICD-10-CM | POA: Insufficient documentation

## 2020-04-05 LAB — CBC WITH DIFFERENTIAL/PLATELET
Abs Immature Granulocytes: 0.02 10*3/uL (ref 0.00–0.07)
Basophils Absolute: 0 10*3/uL (ref 0.0–0.1)
Basophils Relative: 0 %
Eosinophils Absolute: 0 10*3/uL (ref 0.0–0.5)
Eosinophils Relative: 0 %
HCT: 39.8 % (ref 36.0–46.0)
Hemoglobin: 13.9 g/dL (ref 12.0–15.0)
Immature Granulocytes: 0 %
Lymphocytes Relative: 38 %
Lymphs Abs: 3.5 10*3/uL (ref 0.7–4.0)
MCH: 35.3 pg — ABNORMAL HIGH (ref 26.0–34.0)
MCHC: 34.9 g/dL (ref 30.0–36.0)
MCV: 101 fL — ABNORMAL HIGH (ref 80.0–100.0)
Monocytes Absolute: 0.6 10*3/uL (ref 0.1–1.0)
Monocytes Relative: 6 %
Neutro Abs: 5.1 10*3/uL (ref 1.7–7.7)
Neutrophils Relative %: 56 %
Platelets: 136 10*3/uL — ABNORMAL LOW (ref 150–400)
RBC: 3.94 MIL/uL (ref 3.87–5.11)
RDW: 13.1 % (ref 11.5–15.5)
WBC: 9.2 10*3/uL (ref 4.0–10.5)
nRBC: 0 % (ref 0.0–0.2)

## 2020-04-05 LAB — COMPREHENSIVE METABOLIC PANEL
ALT: 9 U/L (ref 0–44)
AST: 23 U/L (ref 15–41)
Albumin: 4.2 g/dL (ref 3.5–5.0)
Alkaline Phosphatase: 66 U/L (ref 38–126)
Anion gap: 7 (ref 5–15)
BUN: 9 mg/dL (ref 6–20)
CO2: 27 mmol/L (ref 22–32)
Calcium: 9.8 mg/dL (ref 8.9–10.3)
Chloride: 105 mmol/L (ref 98–111)
Creatinine, Ser: 0.93 mg/dL (ref 0.44–1.00)
GFR, Estimated: >60 mL/min (ref >60)
Glucose, Bld: 90 mg/dL (ref 70–99)
Potassium: 4.1 mmol/L (ref 3.5–5.1)
Sodium: 139 mmol/L (ref 135–145)
Total Bilirubin: 0.8 mg/dL (ref 0.3–1.2)
Total Protein: 7.8 g/dL (ref 6.5–8.1)

## 2020-04-05 LAB — LIPID PANEL
Cholesterol: 180 mg/dL (ref 0–200)
HDL: 44 mg/dL (ref >40)
LDL Cholesterol: 75 mg/dL (ref 0–99)
Total CHOL/HDL Ratio: 4.1 RATIO
Triglycerides: 305 mg/dL — ABNORMAL HIGH (ref <150)
VLDL: 61 mg/dL — ABNORMAL HIGH (ref 0–40)

## 2020-04-05 LAB — POCT URINE DRUG SCREEN - MANUAL ENTRY (I-SCREEN)
POC Amphetamine UR: NOT DETECTED
POC Buprenorphine (BUP): NOT DETECTED
POC Cocaine UR: POSITIVE — AB
POC Marijuana UR: NOT DETECTED
POC Methadone UR: NOT DETECTED
POC Methamphetamine UR: NOT DETECTED
POC Morphine: NOT DETECTED
POC Oxazepam (BZO): NOT DETECTED
POC Oxycodone UR: NOT DETECTED
POC Secobarbital (BAR): NOT DETECTED

## 2020-04-05 LAB — HEMOGLOBIN A1C
Hgb A1c MFr Bld: 5.6 % (ref 4.8–5.6)
Mean Plasma Glucose: 114.02 mg/dL

## 2020-04-05 LAB — POC SARS CORONAVIRUS 2 AG: SARS Coronavirus 2 Ag: NEGATIVE

## 2020-04-05 LAB — ETHANOL: Alcohol, Ethyl (B): <10 mg/dL (ref <10)

## 2020-04-05 LAB — RESP PANEL BY RT-PCR (FLU A&B, COVID) ARPGX2
Influenza A by PCR: NEGATIVE
Influenza B by PCR: NEGATIVE
SARS Coronavirus 2 by RT PCR: NEGATIVE

## 2020-04-05 LAB — VALPROIC ACID LEVEL: Valproic Acid Lvl: <10 ug/mL — ABNORMAL LOW (ref 50.0–100.0)

## 2020-04-05 LAB — POC SARS CORONAVIRUS 2 AG -  ED: SARS Coronavirus 2 Ag: NEGATIVE

## 2020-04-05 LAB — TSH: TSH: 1.202 u[IU]/mL (ref 0.350–4.500)

## 2020-04-05 MED ORDER — MAGNESIUM HYDROXIDE 400 MG/5ML PO SUSP: 30.0000 mL | Freq: Every day | ORAL | Status: DC | PRN

## 2020-04-05 MED ORDER — SERTRALINE HCL 50 MG PO TABS
50.0000 mg | ORAL_TABLET | Freq: Every day | ORAL | Status: DC
  Administered 2020-04-05 – 2020-04-07 (×3): 50 mg via ORAL
  Filled 2020-04-05: qty 1
  Filled 2020-04-05: qty 7
  Filled 2020-04-05 (×2): qty 1

## 2020-04-05 MED ORDER — AMLODIPINE BESYLATE 5 MG PO TABS
5.0000 mg | ORAL_TABLET | Freq: Every day | ORAL | Status: DC
  Administered 2020-04-05 – 2020-04-07 (×3): 5 mg via ORAL
  Filled 2020-04-05: qty 1
  Filled 2020-04-05: qty 7
  Filled 2020-04-05 (×2): qty 1

## 2020-04-05 MED ORDER — QUETIAPINE FUMARATE 100 MG PO TABS
100.0000 mg | ORAL_TABLET | Freq: Every day | ORAL | Status: DC
  Administered 2020-04-05 – 2020-04-06 (×2): 100 mg via ORAL
  Filled 2020-04-05: qty 1
  Filled 2020-04-05: qty 7
  Filled 2020-04-05: qty 1

## 2020-04-05 MED ORDER — NICOTINE 14 MG/24HR TD PT24
14.0000 mg | MEDICATED_PATCH | Freq: Every day | TRANSDERMAL | Status: DC
  Administered 2020-04-05 – 2020-04-07 (×3): 14 mg via TRANSDERMAL
  Filled 2020-04-05 (×3): qty 1

## 2020-04-05 MED ORDER — ELVITEG-COBIC-EMTRICIT-TENOFAF 150-150-200-10 MG PO TABS
1.0000 | ORAL_TABLET | Freq: Every day | ORAL | Status: DC
  Administered 2020-04-05 – 2020-04-07 (×3): 1 via ORAL
  Filled 2020-04-05 (×2): qty 1
  Filled 2020-04-05: qty 7
  Filled 2020-04-05: qty 1

## 2020-04-05 MED ORDER — ACETAMINOPHEN 325 MG PO TABS: 650.0000 mg | ORAL_TABLET | Freq: Four times a day (QID) | ORAL | Status: DC | PRN

## 2020-04-05 MED ORDER — DIVALPROEX SODIUM 500 MG PO DR TAB
500.0000 mg | DELAYED_RELEASE_TABLET | Freq: Two times a day (BID) | ORAL | Status: DC
  Administered 2020-04-05 – 2020-04-07 (×5): 500 mg via ORAL
  Filled 2020-04-05 (×4): qty 1
  Filled 2020-04-05: qty 14
  Filled 2020-04-05: qty 1

## 2020-04-05 MED ORDER — ALUM & MAG HYDROXIDE-SIMETH 200-200-20 MG/5ML PO SUSP: 30.0000 mL | ORAL | Status: DC | PRN

## 2020-04-05 NOTE — Assessment & Plan Note (Signed)
When she is stabilized we need to draw blood for repeat CD4 and viral load then restart Genvoya 1 daily.  I would like her to follow-up here as soon as she is discharged from behavioral health.

## 2020-04-05 NOTE — ED Notes (Signed)
Salad provided.

## 2020-04-05 NOTE — Assessment & Plan Note (Signed)
Unfortunately she has relapsed with cocaine use.  I reminded her that she has had prolonged periods of sobriety in the past suggesting that she can get sober again.  In addition to help with addiction and depression we will try to provide some assistance to find her safer, more stable housing.
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
## 2020-04-05 NOTE — Assessment & Plan Note (Addendum)
She has acute on chronic depression triggered by the upcoming anniversary of her husband's death and her recent relapse with cocaine and alcohol use.  This has triggered suicidal thoughts.  We arranged supervised transportation to behavioral health for further evaluation and treatment.

## 2020-04-05 NOTE — Progress Notes (Signed)
[facility] unable to provide bed due to nurse staffing shortage, Per Dr. Jola Babinski, patient referred out at this time.   Pt. meets criteria for inpatient treatment per Reola Calkins, NP. Referred out to the following hospitals:   Destination  Service Provider Address Phone Fax  [facility]  [address], [place] [address] [phone]  [facility] [address]  [address], [place] [address] [phone]  [facility]  [address], [place] [address] [phone]  [facility]  [address] [place], [place] [address] [phone]  [facility]  [address], [place] [address] [phone]  [facility]  [address] [place] [address] [phone]  [facility]  [address], [place] [address] [phone]  [facility]  [address] [place] [address] [phone]  [facility]  [address], [place] [address] [phone]  [facility]  [address] [place] [address] [phone]  [facility]  [address], [place] [address] [phone]  [facility]  [address], [place] [address] [phone]  [facility]  [address] [place] [address] [phone]  [facility]  [address] [place] [address] [phone]  [facility]  [address], [place] [address] [phone]  [facility]  [address], [place] [address] [phone]  [facility]  [address] [place]  [place] [address] [phone]  [facility]  [address] [place], [place] [address] [phone]  [facility]  [address] [place] [address] [phone]  [facility]  [address] [place] [address] [phone]  [facility]  [address] [place] [address] [phone]  [facility]  [address] [place] [address] [phone]     Disposition CSW will continue to follow for placement.  Signed:  Corky Crafts, MSW, [place], LCASA 04/05/2020 2:20 PM

## 2020-04-05 NOTE — ED Notes (Signed)
Pt sleeping@this  tme. Breathing even and unlabored. Will continue to monitor for safety

## 2020-04-05 NOTE — ED Notes (Signed)
Pt sleeping at present, no distress noted, monitoring for safety. 

## 2020-04-05 NOTE — ED Notes (Signed)
Pt accepted PO meds w/o difficulty and Nicotine patch placed to R deltoid. Pt crying but easily calmed by staff. Meal provided. Safety maintained and will continue to monitor.

## 2020-04-05 NOTE — ED Notes (Signed)
Pt admitted to continuous assessment due to active SI and unable to contract for safety. Pt states losing husband <1 year ago and unable to cope. Pt crying profusely throughout assessment. Unable to be consoled. Will monitor for safety.
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
## 2020-04-06 NOTE — Progress Notes (Signed)
Patient information has been sent to [facility] via secure chat to review for potential admission. Patient meets inpatient criteria per Reola Calkins, RN.    Situation ongoing, CSW will continue to monitor progress.    Signed:  Corky Crafts, MSW, [place], LCASA 04/06/2020 10:23 AM

## 2020-04-06 NOTE — ED Provider Notes (Signed)
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 of Associations:Intact  Orientation:Full (Time, Place and Person)  Thought Content:WDL  Diagnosis of Schizophrenia or Schizoaffective disorder in past: No  Duration of Psychotic Symptoms: Greater than six months   Hallucinations:Hallucinations: None  Ideas of Reference:None  Suicidal Thoughts:Suicidal Thoughts: Yes, Active SI Active Intent and/or  Plan: With Intent; With Plan  Homicidal Thoughts:Homicidal Thoughts: No   Sensorium  Memory:Immediate Good; Recent Good; Remote Good  Judgment:Fair  Insight:Fair   Executive Functions  Concentration:Good  Attention Span:Good  Recall:Good  Fund of Knowledge:Good  Language:Good   Psychomotor Activity  Psychomotor Activity:Psychomotor  Activity: Normal   Assets  Assets:Communication Skills; Desire for Improvement   Sleep  Sleep:Sleep: Fair   Nutritional Assessment (For OBS and FBC admissions only) Has the patient had a weight loss or gain of 10 pounds or more in the last 3 months?: No Has the patient had a decrease in food intake/or appetite?: No Does the patient have dental problems?: No Does the patient have eating habits or behaviors that may be indicators of an eating disorder including binging or inducing vomiting?: No Has the patient recently lost weight without trying?: No Has the patient been eating poorly because of a decreased appetite?: No Malnutrition Screening Tool Score: 0    Physical Exam  Physical Exam Vitals and nursing note reviewed.  Constitutional:      Appearance: She is well-developed.  HENT:     Head: Normocephalic.  Eyes:     Pupils: Pupils are equal, round, and reactive to light.  Cardiovascular:     Rate and Rhythm: Normal rate.  Pulmonary:     Effort: Pulmonary effort is normal.  Musculoskeletal:        General: Normal range of motion.  Neurological:     Mental Status: She is alert and oriented to person, place, and time.    Review of Systems  Constitutional: Negative.   HENT: Negative.   Eyes: Negative.   Respiratory: Negative.   Cardiovascular: Negative.   Gastrointestinal: Negative.   Genitourinary: Negative.   Musculoskeletal: Negative.   Skin: Negative.   Neurological: Negative.   Endo/Heme/Allergies: Negative.   Psychiatric/Behavioral: Positive for depression and suicidal ideas.   Blood pressure (!) 125/56,  pulse 66, temperature 98.5 F (36.9 C), temperature source Oral, resp. rate 16, SpO2 100 %. There is no height or weight on file to calculate BMI.  Treatment Plan Summary: Daily contact with patient to assess and evaluate symptoms and progress in treatment and Medication management  Continue current home medications of Norvasc 5 mg p.o. daily, Depakote DR 500 mg p.o. every 12 hours, Genvoya 1 tablet daily, Seroquel 100 mg p.o. nightly, and Zoloft 50 mg p.o. daily.  Patient continues to meet inpatient psychiatric treatment criteria and social work is seeking placement.  Gerlene Burdockravis B Kron Everton, FNP 04/06/2020 2:12 PM

## 2020-04-06 NOTE — ED Notes (Signed)
Pt A&O x 4, no distress noted, pt with flat affect, calm & cooperative.  Monitoring for safety.

## 2020-04-06 NOTE — ED Notes (Signed)
Pt sleeping@this  time. Breathing even and unlabored. Will Continue to monitor for safety

## 2020-04-07 MED ORDER — AMLODIPINE BESYLATE 5 MG PO TABS: 5.0000 mg | ORAL_TABLET | Freq: Every day | ORAL | 0 refills | Status: DC

## 2020-04-07 MED ORDER — SERTRALINE HCL 50 MG PO TABS: 50.0000 mg | ORAL_TABLET | Freq: Every day | ORAL | 0 refills | Status: DC

## 2020-04-07 MED ORDER — GENVOYA 150-150-200-10 MG PO TABS: 1.0000 | ORAL_TABLET | Freq: Every day | ORAL | 0 refills | Status: DC

## 2020-04-07 MED ORDER — DIVALPROEX SODIUM 500 MG PO DR TAB: 500.0000 mg | DELAYED_RELEASE_TABLET | Freq: Two times a day (BID) | ORAL | 0 refills | Status: DC

## 2020-04-07 MED ORDER — QUETIAPINE FUMARATE 100 MG PO TABS: 100.0000 mg | ORAL_TABLET | Freq: Every day | ORAL | 0 refills | Status: DC

## 2020-04-07 NOTE — ED Notes (Signed)
Pt sleeping in no acute distress. RR even and unlabored. Safety maintained. 

## 2020-04-07 NOTE — Discharge Instructions (Addendum)

## 2020-04-07 NOTE — ED Notes (Signed)
Breakfast given.  

## 2020-04-07 NOTE — ED Notes (Signed)
Patient A&O x 4, ambulatory. Patient discharged in no acute distress. Patient denied SI/HI, A/VH upon discharge. Patient verbalized understanding of all discharge instructions explained by staff, to include follow up appointments, RX's and safety plan. Pt belongings returned to patient from locker #26 intact. Patient escorted to [facility] via staff for transport to destination. Safety maintained.

## 2020-04-07 NOTE — ED Notes (Signed)
Pt denies SI/HI/AVH. Pt reports, "I feel better today. I've gotten plenty of rest". Pt request to discharge with transport to her car in parking lot of [facility], then drive herself to [facility]. Awaiting disposition. Safety maintained.
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
## 2020-04-07 NOTE — ED Notes (Signed)
Pt sleeping@this time. Breathing even and unlabored. Will continue to monitor for safety 

## 2020-04-19 ENCOUNTER — Encounter: Payer: Self-pay | Admitting: Internal Medicine

## 2020-04-26 ENCOUNTER — Telehealth: Payer: Self-pay

## 2020-04-26 NOTE — Telephone Encounter (Signed)
Received faxed request for medical records as patient is transferring care to [facility] in [place], [place]. Placed release request in front office to be processed by medical records, copy in triage. RN faxed last office note, labs, and immunizations to assist in bridging patient to care.   [facility] P: [phone] F: [phone]  Sandie Ano, RN

## 2020-08-08 NOTE — Progress Notes (Signed)
Patient ID: Daisy Salinas, female   DOB: 06-15-1960, 60 y.o.   MRN: [id number] [name] came in today and advised she had 2 pills left  ask if they were refilled in [facility] at [facility] she advised she never got seen there,  Called [facility] spoke with [employer] who advised they received records but patient was never seen nothing to transfer back  Had front desk schedule appointment

## 2020-08-09 ENCOUNTER — Ambulatory Visit (INDEPENDENT_AMBULATORY_CARE_PROVIDER_SITE_OTHER): Payer: Self-pay | Admitting: Internal Medicine

## 2020-08-09 ENCOUNTER — Encounter: Payer: Self-pay | Admitting: Internal Medicine

## 2020-08-09 ENCOUNTER — Other Ambulatory Visit: Payer: Self-pay

## 2020-08-09 ENCOUNTER — Ambulatory Visit: Payer: Self-pay

## 2020-08-09 ENCOUNTER — Telehealth: Payer: Self-pay

## 2020-08-09 DIAGNOSIS — B20 Human immunodeficiency virus [HIV] disease: Secondary | ICD-10-CM

## 2020-08-09 DIAGNOSIS — F3176 Bipolar disorder, in full remission, most recent episode depressed: Secondary | ICD-10-CM

## 2020-08-09 DIAGNOSIS — F1410 Cocaine abuse, uncomplicated: Secondary | ICD-10-CM

## 2020-08-09 MED ORDER — GENVOYA 150-150-200-10 MG PO TABS: 1.0000 | ORAL_TABLET | Freq: Every day | ORAL | 11 refills | Status: DC

## 2020-08-09 NOTE — Assessment & Plan Note (Signed)
We were able to get her an emergency supply of Genvoya.  She will recertify for ADAP.  She will get blood work today and follow-up in 1 month.

## 2020-08-09 NOTE — Progress Notes (Signed)
Patient Active Problem List   Diagnosis Date Noted   Grief reaction 05/31/2019    Priority: High   Chronic hepatitis C without hepatic coma (HCC) 09/08/2007    Priority: High   Human immunodeficiency virus (HIV) disease (HCC) 10/29/2005    Priority: High   History of latent tuberculosis 02/25/2018   Syncope 08/20/2017   Bipolar affective disorder (HCC) 08/20/2017   DJD (degenerative joint disease) 03/12/2016   Right hip pain 08/08/2015   Cocaine abuse (HCC) 11/30/2009   BRONCHITIS, CHRONIC 01/03/2009   CONSTIPATION, CHRONIC 05/09/2008   DE QUERVAIN'S TENOSYNOVITIS, LEFT WRIST 05/09/2008   HYSTERECTOMY, HX OF 05/13/2006   Depression 05/06/2006   SYPHILIS, LATE, LATENT 10/29/2005   DYSLIPIDEMIA 10/29/2005   CIGARETTE SMOKER 10/29/2005   Essential hypertension 10/29/2005   PROLAPSE, VAGINAL WALL NOS 10/29/2005   MENORRHAGIA 10/29/2005   INSOMNIA 10/29/2005    Patient's Medications  New Prescriptions   No medications on file  Previous Medications   AMLODIPINE (NORVASC) 5 MG TABLET    Take 1 tablet (5 mg total) by mouth daily.   DIVALPROEX (DEPAKOTE) 500 MG DR TABLET    Take 1 tablet (500 mg total) by mouth every 12 (twelve) hours.   QUETIAPINE (SEROQUEL) 100 MG TABLET    Take 1 tablet (100 mg total) by mouth at bedtime.   SERTRALINE (ZOLOFT) 50 MG TABLET    Take 1 tablet (50 mg total) by mouth daily.  Modified Medications   Modified Medication Previous Medication   ELVITEGRAVIR-COBICISTAT-EMTRICITABINE-TENOFOVIR (GENVOYA) 150-150-200-10 MG TABS TABLET elvitegravir-cobicistat-emtricitabine-tenofovir (GENVOYA) 150-150-200-10 MG TABS tablet      Take 1 tablet by mouth daily.    Take 1 tablet by mouth daily.  Discontinued Medications   No medications on file    Subjective: Daisy Salinas is in for her routine HIV follow-up visit.  She denies any problems obtaining, taking or tolerating her Genvoya recently but only has 1 pill left and her ADAP has not been recertified  yet.  She says that she does not believe she can go back and get her psychiatric medications at [facility] but is not sure where to turn.  Fortunately she has been sober and off cocaine and alcohol recently.  She denies feeling depressed.  She thanked me for "not giving up" on her.  Review of Systems: Review of Systems  Constitutional:  Negative for fever and weight loss.  Respiratory:  Negative for cough.   Cardiovascular:  Negative for chest pain.  Gastrointestinal:  Negative for abdominal pain, diarrhea, nausea and vomiting.  Psychiatric/Behavioral:  Negative for depression and substance abuse. The patient is not nervous/anxious.    Past Medical History:  Diagnosis Date   Asthma    Bipolar 1 disorder (HCC) 2009   Depression    GERD (gastroesophageal reflux disease)    HIV (human immunodeficiency virus infection) (HCC)    Hypertension    TB (pulmonary tuberculosis) 1989   was exposed and treated   UTI (urinary tract infection) 1997    Social History   Tobacco Use   Smoking status: Every Day    Packs/day: 0.70    Years: 45.00    Pack years: 31.50    Types: Cigarettes   Smokeless tobacco: Never   Tobacco comments:    wanting "some cinnamon" to help her stop  Vaping Use   Vaping Use: Never used  Substance Use Topics   Alcohol use: Not Currently    Alcohol/week: 2.0 standard drinks  Types: 1 Cans of beer, 1 Standard drinks or equivalent per week    Comment: occ   Drug use: Not Currently    Frequency: 1.0 times per week    Types: Cocaine    Comment: Occassional use    Family History  Problem Relation Age of Onset   Coronary artery disease Mother    Leukemia Father     No Known Allergies  Health Maintenance  Topic Date Due   Zoster Vaccines- Shingrix (1 of 2) Never done   PAP SMEAR-Modifier  03/24/2009   MAMMOGRAM  Never done   Pneumococcal Vaccine 31-76 Years old (3 - PCV) 12/01/2010   COLONOSCOPY (Pts 45-55yrs Insurance coverage will need to be confirmed)   06/23/2018   COVID-19 Vaccine (2 - Janssen risk series) 06/30/2019   INFLUENZA VACCINE  08/21/2020   TETANUS/TDAP  10/08/2028   Hepatitis C Screening  Completed   HIV Screening  Completed   HPV VACCINES  Aged Out    Objective:  Vitals:   08/09/20 1042  BP: 128/82  Pulse: 60  Temp: 98.1 F (36.7 C)  TempSrc: Oral  Weight: 155 lb (70.3 kg)   Body mass index is 31.31 kg/m.  Physical Exam Constitutional:      Comments: She is calm and in good spirits today.  Cardiovascular:     Rate and Rhythm: Normal rate and regular rhythm.     Heart sounds: No murmur heard. Pulmonary:     Effort: Pulmonary effort is normal.     Breath sounds: Normal breath sounds.  Abdominal:     Palpations: Abdomen is soft.     Tenderness: There is no abdominal tenderness.  Psychiatric:        Mood and Affect: Mood normal.    Lab Results Lab Results  Component Value Date   WBC 9.2 04/05/2020   HGB 13.9 04/05/2020   HCT 39.8 04/05/2020   MCV 101.0 (H) 04/05/2020   PLT 136 (L) 04/05/2020    Lab Results  Component Value Date   CREATININE 0.93 04/05/2020   BUN 9 04/05/2020   NA 139 04/05/2020   K 4.1 04/05/2020   CL 105 04/05/2020   CO2 27 04/05/2020    Lab Results  Component Value Date   ALT 9 04/05/2020   AST 23 04/05/2020   ALKPHOS 66 04/05/2020   BILITOT 0.8 04/05/2020    Lab Results  Component Value Date   CHOL 180 04/05/2020   HDL 44 04/05/2020   LDLCALC 75 04/05/2020   TRIG 305 (H) 04/05/2020   CHOLHDL 4.1 04/05/2020   Lab Results  Component Value Date   LABRPR REACTIVE (A) 06/02/2019   RPRTITER 1:2 (H) 06/02/2019   HIV 1 RNA Quant (copies/mL)  Date Value  06/02/2019 <20 NOT DETECTED  08/25/2018 <20 NOT DETECTED  02/25/2018 <20 DETECTED (A)   CD4 T Cell Abs (/uL)  Date Value  06/02/2019 1,532  08/25/2018 1,662  02/25/2018 1,600     Problem List Items Addressed This Visit       High   Human immunodeficiency virus (HIV) disease (HCC)    We were able to  get her an emergency supply of Genvoya.  She will recertify for ADAP.  She will get blood work today and follow-up in 1 month.       Relevant Medications   elvitegravir-cobicistat-emtricitabine-tenofovir (GENVOYA) 150-150-200-10 MG TABS tablet   Other Relevant Orders   T-helper cell (CD4)- ([facility] clinic only)   HIV-1 RNA quant-no reflex-bld  CBC   Comprehensive metabolic panel   RPR   Lipid panel     Unprioritized   Bipolar affective disorder (HCC) (Chronic)    Her depression continues to cycle up and down.  Fortunately she does much better when she is sober.  I will arrange a visit as soon as possible with our behavioral health counselor so she can identify options for ongoing psychiatric care.       Cocaine abuse (HCC)    She is committed to staying sober.          Cliffton Asters, MD [facility] for Infectious Disease [facility] [phone] pager   [phone] cell 08/09/2020, 11:11 AM

## 2020-08-09 NOTE — Assessment & Plan Note (Signed)
Her depression continues to cycle up and down.  Fortunately she does much better when she is sober.  I will arrange a visit as soon as possible with our behavioral health counselor so she can identify options for ongoing psychiatric care.

## 2020-08-09 NOTE — Assessment & Plan Note (Signed)
She is committed to staying sober.

## 2020-08-09 NOTE — Telephone Encounter (Addendum)
[facility] Patient Advocate Encounter  Completed and sent [employer] Advancing Access application for Genvoya for this patient who is uninsured.    Patient is approved 08/09/20 through 08/09/21       Clearance Coots, CPhT Specialty Pharmacy Patient Advocate [facility] for Infectious Disease Phone: [phone] Fax:  [phone]

## 2020-08-11 LAB — COMPREHENSIVE METABOLIC PANEL
AG Ratio: 1.2 (calc) (ref 1.0–2.5)
ALT: 6 U/L (ref 6–29)
AST: 18 U/L (ref 10–35)
Albumin: 4.4 g/dL (ref 3.6–5.1)
Alkaline phosphatase (APISO): 59 U/L (ref 37–153)
BUN: 11 mg/dL (ref 7–25)
CO2: 27 mmol/L (ref 20–32)
Calcium: 9.6 mg/dL (ref 8.6–10.4)
Chloride: 105 mmol/L (ref 98–110)
Creat: 0.75 mg/dL (ref 0.50–1.05)
Globulin: 3.7 g/dL (calc) (ref 1.9–3.7)
Glucose, Bld: 89 mg/dL (ref 65–99)
Potassium: 3.7 mmol/L (ref 3.5–5.3)
Sodium: 140 mmol/L (ref 135–146)
Total Bilirubin: 0.3 mg/dL (ref 0.2–1.2)
Total Protein: 8.1 g/dL (ref 6.1–8.1)

## 2020-08-11 LAB — T-HELPER CELL (CD4) - (RCID CLINIC ONLY)
CD4 % Helper T Cell: 47 % (ref 33–65)
CD4 T Cell Abs: 1430 /uL (ref 400–1790)

## 2020-08-11 LAB — CBC
HCT: 38.6 % (ref 35.0–45.0)
Hemoglobin: 13.2 g/dL (ref 11.7–15.5)
MCH: 34.1 pg — ABNORMAL HIGH (ref 27.0–33.0)
MCHC: 34.2 g/dL (ref 32.0–36.0)
MCV: 99.7 fL (ref 80.0–100.0)
MPV: 11.7 fL (ref 7.5–12.5)
Platelets: 145 10*3/uL (ref 140–400)
RBC: 3.87 10*6/uL (ref 3.80–5.10)
RDW: 13.1 % (ref 11.0–15.0)
WBC: 6 10*3/uL (ref 3.8–10.8)

## 2020-08-11 LAB — LIPID PANEL
Cholesterol: 245 mg/dL — ABNORMAL HIGH (ref <200)
HDL: 39 mg/dL — ABNORMAL LOW (ref >=50)
LDL Cholesterol (Calc): 163 mg/dL (calc) — ABNORMAL HIGH
Non-HDL Cholesterol (Calc): 206 mg/dL (calc) — ABNORMAL HIGH (ref <130)
Total CHOL/HDL Ratio: 6.3 (calc) — ABNORMAL HIGH (ref <5.0)
Triglycerides: 262 mg/dL — ABNORMAL HIGH (ref <150)

## 2020-08-11 LAB — FLUORESCENT TREPONEMAL AB(FTA)-IGG-BLD: Fluorescent Treponemal ABS: REACTIVE — AB

## 2020-08-11 LAB — HIV-1 RNA QUANT-NO REFLEX-BLD
HIV 1 RNA Quant: NOT DETECTED Copies/mL
HIV-1 RNA Quant, Log: NOT DETECTED Log cps/mL

## 2020-08-11 LAB — RPR TITER: RPR Titer: 1:2 {titer} — ABNORMAL HIGH

## 2020-08-11 LAB — RPR: RPR Ser Ql: REACTIVE — AB

## 2020-08-15 ENCOUNTER — Ambulatory Visit: Payer: Self-pay

## 2020-08-17 ENCOUNTER — Ambulatory Visit: Payer: Self-pay

## 2020-08-17 ENCOUNTER — Other Ambulatory Visit: Payer: Self-pay

## 2020-08-24 ENCOUNTER — Ambulatory Visit: Payer: Self-pay

## 2020-08-24 ENCOUNTER — Other Ambulatory Visit: Payer: Self-pay

## 2020-08-31 ENCOUNTER — Ambulatory Visit: Payer: Self-pay

## 2020-09-06 ENCOUNTER — Ambulatory Visit: Payer: Self-pay | Admitting: Internal Medicine

## 2020-09-07 ENCOUNTER — Ambulatory Visit: Payer: Self-pay

## 2020-09-12 ENCOUNTER — Telehealth: Payer: Self-pay

## 2020-09-12 NOTE — Telephone Encounter (Signed)
Patient called stating she needed refills on her medication because she is almost out... She stated it was for her (GENVOYA) and her medication for her "mental", I didn't see that Dr. Orvan Falconer prescribed that but told her I would get the messaged relayed.

## 2020-09-12 NOTE — Telephone Encounter (Signed)
Tried to call patient about requests; numbers did not work. RN sent mychart message requesting return call. Patient advised that she has refills on her Genvoya and her mental health team would need to refill her psych medications.   Kevionna Heffler Loyola Mast, RN

## 2020-09-13 ENCOUNTER — Other Ambulatory Visit: Payer: Self-pay | Admitting: Internal Medicine

## 2020-09-13 MED ORDER — AMLODIPINE BESYLATE 5 MG PO TABS: 5.0000 mg | ORAL_TABLET | Freq: Every day | ORAL | 0 refills | Status: DC

## 2020-09-14 ENCOUNTER — Encounter: Payer: Self-pay | Admitting: Internal Medicine

## 2020-09-14 ENCOUNTER — Other Ambulatory Visit: Payer: Self-pay

## 2020-09-14 DIAGNOSIS — B20 Human immunodeficiency virus [HIV] disease: Secondary | ICD-10-CM

## 2020-09-18 ENCOUNTER — Ambulatory Visit (INDEPENDENT_AMBULATORY_CARE_PROVIDER_SITE_OTHER): Payer: Self-pay | Admitting: Primary Care

## 2020-09-18 ENCOUNTER — Encounter (INDEPENDENT_AMBULATORY_CARE_PROVIDER_SITE_OTHER): Payer: Self-pay | Admitting: Primary Care

## 2020-09-18 ENCOUNTER — Other Ambulatory Visit: Payer: Self-pay

## 2020-09-18 DIAGNOSIS — F3342 Major depressive disorder, recurrent, in full remission: Secondary | ICD-10-CM

## 2020-09-18 DIAGNOSIS — Z7689 Persons encountering health services in other specified circumstances: Secondary | ICD-10-CM

## 2020-09-18 DIAGNOSIS — I10 Essential (primary) hypertension: Secondary | ICD-10-CM

## 2020-09-18 DIAGNOSIS — Z79899 Other long term (current) drug therapy: Secondary | ICD-10-CM

## 2020-09-18 NOTE — Progress Notes (Signed)
[facility]  Telephone Note  I connected with Daisy Salinas, on 09/18/2020 at 2:14 PM r by telephone and verified that I am speaking with the correct person using two identifiers.   Consent: I discussed the limitations, risks, security and privacy concerns of performing an evaluation and management service by telephone and the availability of in person appointments. I also discussed with the patient that there may be a patient responsible charge related to this service. The patient expressed understanding and agreed to proceed.   Location of Patient: home  Location of Provider: [facility]   Persons participating in Telemedicine visit: Jacqulyn Ducking,  NP Cristela Felt , CMA  History of Present Illness: Ms. Daisy Salinas is a 60 year old female establishing care. She is out of medication unable to afford medicine for HTN and mental health medication. Patient feels like life is not worth living because she is having financial issues with getting her medication. She has been clean for 6 months doesn't want to relapse. Participates in NA meetings daily. Patient refuses to be committed she states" I have already been away". Promise she would not hurt herself. She has a daughter that she is now residing with and does not want to lose again. Patient understands the risks of her relapsing. She is trying very hard to remain clean. She is just seeking help with medications.    Past Medical History:  Diagnosis Date   Asthma    Bipolar 1 disorder (HCC) 2009   Depression    GERD (gastroesophageal reflux disease)    HIV (human immunodeficiency virus infection) (HCC)    Hypertension    TB (pulmonary tuberculosis) 1989   was exposed and treated   UTI (urinary tract infection) 1997   No Known Allergies  Current Outpatient Medications on File Prior to Visit  Medication Sig Dispense Refill    elvitegravir-cobicistat-emtricitabine-tenofovir (GENVOYA) 150-150-200-10 MG TABS tablet Take 1 tablet by mouth daily. 30 tablet 11   amLODipine (NORVASC) 5 MG tablet Take 1 tablet (5 mg total) by mouth daily. (Patient not taking: Reported on 09/18/2020) 30 tablet 0   divalproex (DEPAKOTE) 500 MG DR tablet Take 1 tablet (500 mg total) by mouth every 12 (twelve) hours. (Patient not taking: Reported on 09/18/2020) 60 tablet 0   QUEtiapine (SEROQUEL) 100 MG tablet Take 1 tablet (100 mg total) by mouth at bedtime. (Patient not taking: Reported on 09/18/2020) 30 tablet 0   sertraline (ZOLOFT) 50 MG tablet Take 1 tablet (50 mg total) by mouth daily. (Patient not taking: Reported on 09/18/2020) 30 tablet 0   No current facility-administered medications on file prior to visit.    Observations/Objective: There were no vitals taken for this visit.  Review of Systems  Psychiatric/Behavioral:  Positive for depression and substance abuse. The patient is nervous/anxious.   All other systems reviewed and are negative.   Assessment and Plan:  [name] was seen today for new patient (initial visit).  Diagnoses and all orders for this visit:  Encounter to establish care Establish care with PCP  Essential hypertension Counseled on blood pressure goal of less than 130/80, low-sodium, DASH diet, medication compliance, 150 minutes of moderate intensity exercise per week. Discussed medication compliance, adverse effects.   Recurrent major depressive disorder, in full remission [facility]) Patient is in high risk of relapsing if medication is not resume. Agreed to refilled Zoloft 50mg  daily , Seroquel 100mg   daily and Depakote 500mg  bid. Patient is  followed by ID and medication will be sent to [facility] in hopes  Juanell Fairly will cover medication if on list if not the others will be filled at [facility] if available. Patient will f/u with CSW and refer to psychiatry. Patient understands this is a 30 day refill  on medications and future refills will be prescribed by Behavioral health or psychiatry   High risk medication use -     Valproic acid level <4    Follow Up Instructions:    I discussed the assessment and treatment plan with the patient. The patient was provided an opportunity to ask questions and all were answered. The patient agreed with the plan and demonstrated an understanding of the instructions.   The patient was advised to call back or seek an in-person evaluation if the symptoms worsen or if the condition fails to improve as anticipated.     I provided 60 minutes total of non-face-to-face time during this encounter including median intraservice time, reviewing previous notes, investigations, ordering medications, medical decision making, coordinating care and patient verbalized understanding at the end of the visit.    This note has been created with Education officer, environmental. Any transcriptional errors are unintentional.   Grayce Sessions, NP 09/18/2020, 2:14 PM

## 2020-09-18 NOTE — Progress Notes (Signed)
Not able to check Bp at home  .metel

## 2020-09-19 ENCOUNTER — Encounter (INDEPENDENT_AMBULATORY_CARE_PROVIDER_SITE_OTHER): Payer: Self-pay | Admitting: Primary Care

## 2020-09-19 ENCOUNTER — Other Ambulatory Visit (INDEPENDENT_AMBULATORY_CARE_PROVIDER_SITE_OTHER): Payer: Self-pay | Admitting: Primary Care

## 2020-09-19 DIAGNOSIS — F3176 Bipolar disorder, in full remission, most recent episode depressed: Secondary | ICD-10-CM

## 2020-09-19 DIAGNOSIS — F3342 Major depressive disorder, recurrent, in full remission: Secondary | ICD-10-CM

## 2020-09-19 LAB — VALPROIC ACID LEVEL: Valproic Acid Lvl: <4 ug/mL — ABNORMAL LOW (ref 50–100)

## 2020-09-19 MED ORDER — DIVALPROEX SODIUM 500 MG PO DR TAB
500.0000 mg | DELAYED_RELEASE_TABLET | Freq: Two times a day (BID) | ORAL | 0 refills | Status: DC
  Filled 2020-09-19: qty 60, 30d supply, fill #0

## 2020-09-19 MED ORDER — SERTRALINE HCL 50 MG PO TABS: 50.0000 mg | ORAL_TABLET | Freq: Every day | ORAL | 0 refills | Status: DC

## 2020-09-19 MED ORDER — QUETIAPINE FUMARATE 100 MG PO TABS
100.0000 mg | ORAL_TABLET | Freq: Every day | ORAL | 0 refills | Status: DC
  Filled 2020-09-19: qty 30, 30d supply, fill #0

## 2020-09-19 MED ORDER — QUETIAPINE FUMARATE 100 MG PO TABS: 100.0000 mg | ORAL_TABLET | Freq: Every day | ORAL | 0 refills | Status: DC

## 2020-09-20 ENCOUNTER — Other Ambulatory Visit: Payer: Self-pay

## 2020-09-20 ENCOUNTER — Ambulatory Visit: Payer: Self-pay | Admitting: Internal Medicine

## 2020-09-20 ENCOUNTER — Telehealth: Payer: Self-pay

## 2020-09-20 ENCOUNTER — Telehealth (INDEPENDENT_AMBULATORY_CARE_PROVIDER_SITE_OTHER): Payer: Self-pay

## 2020-09-20 MED ORDER — DIVALPROEX SODIUM 500 MG PO DR TAB: 500.0000 mg | DELAYED_RELEASE_TABLET | Freq: Two times a day (BID) | ORAL | 0 refills | Status: DC

## 2020-09-20 NOTE — Telephone Encounter (Signed)
As per pharmacy patient insurance is in active and the out of pocket cost medications is over $100, patient would like to know what she can do because can not afford the cost. Patient would like a follow up as soon as possible best # [phone]

## 2020-09-20 NOTE — Telephone Encounter (Signed)
Please advise 

## 2020-09-20 NOTE — Telephone Encounter (Signed)
Following up on a request from the patients PCP. CM verified the patients name, address and date of birth. PCP wanted to share with patient their med refills for psychiatry will need to come from the original prescribed or a psychiatrist. Patient acknowledged they understood and were in need of them. Patient shared the original prescriber of the psych medication will she her tomorrow at her appt. CM encouraged the patient to share her concerns with the refills in that appointment with the provider.  Patient shared after her recent visit to [facility] she was supposed to be set up with a therapist and a psychiatrist.   CM will assist with getting the patient sent up with a psychiatrist and a therapist. Patient shared she has open availability and no preference on female or female.

## 2020-09-20 NOTE — Telephone Encounter (Signed)
Patient aware that medications have been sent. Daisy Salinas, CMA    Copied from CRM [id number]. Topic: General - Call Back - No Documentation >> Sep 19, 2020  1:40 PM Randol Kern wrote: Reason for CRM: Pt called and wants to know the status of her med refills discussed during yesterday's appointment.  Best contact: [phone]

## 2020-09-20 NOTE — Addendum Note (Signed)
Addended by: Gwinda Passe on: 09/20/2020 01:42 PM   Modules accepted: Orders

## 2020-09-21 ENCOUNTER — Other Ambulatory Visit: Payer: Self-pay

## 2020-09-21 ENCOUNTER — Ambulatory Visit: Payer: Self-pay

## 2020-09-21 ENCOUNTER — Ambulatory Visit: Payer: Self-pay | Attending: Primary Care

## 2020-09-21 ENCOUNTER — Telehealth: Payer: Self-pay

## 2020-09-21 ENCOUNTER — Encounter: Payer: Self-pay | Admitting: Internal Medicine

## 2020-09-21 ENCOUNTER — Encounter (INDEPENDENT_AMBULATORY_CARE_PROVIDER_SITE_OTHER): Payer: Self-pay | Admitting: *Deleted

## 2020-09-21 ENCOUNTER — Ambulatory Visit (INDEPENDENT_AMBULATORY_CARE_PROVIDER_SITE_OTHER): Payer: Self-pay | Admitting: Internal Medicine

## 2020-09-21 DIAGNOSIS — F1410 Cocaine abuse, uncomplicated: Secondary | ICD-10-CM

## 2020-09-21 DIAGNOSIS — F3176 Bipolar disorder, in full remission, most recent episode depressed: Secondary | ICD-10-CM

## 2020-09-21 DIAGNOSIS — F17200 Nicotine dependence, unspecified, uncomplicated: Secondary | ICD-10-CM

## 2020-09-21 DIAGNOSIS — F3342 Major depressive disorder, recurrent, in full remission: Secondary | ICD-10-CM

## 2020-09-21 DIAGNOSIS — B20 Human immunodeficiency virus [HIV] disease: Secondary | ICD-10-CM

## 2020-09-21 DIAGNOSIS — I10 Essential (primary) hypertension: Secondary | ICD-10-CM

## 2020-09-21 DIAGNOSIS — Z006 Encounter for examination for normal comparison and control in clinical research program: Secondary | ICD-10-CM

## 2020-09-21 MED ORDER — AMLODIPINE BESYLATE 5 MG PO TABS: 5.0000 mg | ORAL_TABLET | Freq: Every day | ORAL | 11 refills | Status: DC

## 2020-09-21 MED ORDER — QUETIAPINE FUMARATE 100 MG PO TABS: 100.0000 mg | ORAL_TABLET | Freq: Every day | ORAL | 11 refills | Status: DC

## 2020-09-21 MED ORDER — DIVALPROEX SODIUM 500 MG PO DR TAB: 500.0000 mg | DELAYED_RELEASE_TABLET | Freq: Two times a day (BID) | ORAL | 11 refills | Status: DC

## 2020-09-21 MED ORDER — AMLODIPINE BESYLATE 5 MG PO TABS
5.0000 mg | ORAL_TABLET | Freq: Every day | ORAL | 11 refills | Status: DC
  Filled 2020-09-21: qty 30, 30d supply, fill #0

## 2020-09-21 MED ORDER — QUETIAPINE FUMARATE 100 MG PO TABS
100.0000 mg | ORAL_TABLET | Freq: Every day | ORAL | 11 refills | Status: DC
  Filled 2020-09-21: qty 30, 30d supply, fill #0

## 2020-09-21 MED ORDER — SERTRALINE HCL 50 MG PO TABS
50.0000 mg | ORAL_TABLET | Freq: Every day | ORAL | 11 refills | Status: DC
  Filled 2020-09-21: qty 30, 30d supply, fill #0

## 2020-09-21 MED ORDER — SERTRALINE HCL 50 MG PO TABS: 50.0000 mg | ORAL_TABLET | Freq: Every day | ORAL | 11 refills | Status: DC

## 2020-09-21 MED ORDER — DIVALPROEX SODIUM 500 MG PO DR TAB
500.0000 mg | DELAYED_RELEASE_TABLET | Freq: Two times a day (BID) | ORAL | 11 refills | Status: DC
  Filled 2020-09-21: qty 60, 30d supply, fill #0

## 2020-09-21 NOTE — Assessment & Plan Note (Signed)
I encouraged her to continue her efforts to cut down and quit smoking. 

## 2020-09-21 NOTE — Progress Notes (Signed)
Pt requesting medication be sent to Mail order pharmacy.  Daisy Salinas, RMA

## 2020-09-21 NOTE — Progress Notes (Signed)
Patient Active Problem List   Diagnosis Date Noted   Grief reaction 05/31/2019    Priority: High   Chronic hepatitis C without hepatic coma (HCC) 09/08/2007    Priority: High   Human immunodeficiency virus (HIV) disease (HCC) 10/29/2005    Priority: High   History of latent tuberculosis 02/25/2018   Syncope 08/20/2017   Bipolar affective disorder (HCC) 08/20/2017   DJD (degenerative joint disease) 03/12/2016   Right hip pain 08/08/2015   Cocaine abuse (HCC) 11/30/2009   BRONCHITIS, CHRONIC 01/03/2009   CONSTIPATION, CHRONIC 05/09/2008   DE QUERVAIN'S TENOSYNOVITIS, LEFT WRIST 05/09/2008   HYSTERECTOMY, HX OF 05/13/2006   Depression 05/06/2006   SYPHILIS, LATE, LATENT 10/29/2005   DYSLIPIDEMIA 10/29/2005   CIGARETTE SMOKER 10/29/2005   Essential hypertension 10/29/2005   PROLAPSE, VAGINAL WALL NOS 10/29/2005   MENORRHAGIA 10/29/2005   INSOMNIA 10/29/2005    Patient's Medications  New Prescriptions   No medications on file  Previous Medications   ELVITEGRAVIR-COBICISTAT-EMTRICITABINE-TENOFOVIR (GENVOYA) 150-150-200-10 MG TABS TABLET    Take 1 tablet by mouth daily.  Modified Medications   Modified Medication Previous Medication   AMLODIPINE (NORVASC) 5 MG TABLET amLODipine (NORVASC) 5 MG tablet      Take 1 tablet (5 mg total) by mouth daily.    Take 1 tablet (5 mg total) by mouth daily.   DIVALPROEX (DEPAKOTE) 500 MG DR TABLET divalproex (DEPAKOTE) 500 MG DR tablet      Take 1 tablet (500 mg total) by mouth 2 (two) times daily.    Take 1 tablet (500 mg total) by mouth 2 (two) times daily.   QUETIAPINE (SEROQUEL) 100 MG TABLET QUEtiapine (SEROQUEL) 100 MG tablet      Take 1 tablet (100 mg total) by mouth at bedtime.    Take 1 tablet (100 mg total) by mouth at bedtime.   SERTRALINE (ZOLOFT) 50 MG TABLET sertraline (ZOLOFT) 50 MG tablet      Take 1 tablet (50 mg total) by mouth daily.    Take 1 tablet (50 mg total) by mouth daily.  Discontinued Medications   No  medications on file    Subjective: Daisy Salinas is in for her routine HIV follow-up visit.  She has not had any problems obtaining, taking or tolerating her Genvoya and says that she has not missed any doses.  She has been off cocaine and sober for 6 months now.  She is attending meetings on a regular basis.  However, she is struggling with her depression.  She says that she has been unable to obtain her Depakote, Seroquel or Zoloft.  She establish primary care with Gwinda Passe, NP recently.  Marcelino Duster is going to help her find a therapist.  She has cut down on her cigarettes.  A pack lasts her 3 days now.  Review of Systems: Review of Systems  Constitutional:  Negative for fever and weight loss.  Respiratory:  Positive for cough.   Cardiovascular:  Negative for chest pain.  Psychiatric/Behavioral:  Positive for depression.    Past Medical History:  Diagnosis Date   Asthma    Bipolar 1 disorder (HCC) 2009   Depression    GERD (gastroesophageal reflux disease)    HIV (human immunodeficiency virus infection) (HCC)    Hypertension    TB (pulmonary tuberculosis) 1989   was exposed and treated   UTI (urinary tract infection) 1997    Social History   Tobacco Use   Smoking status: Every Day  Packs/day: 0.70    Years: 45.00    Pack years: 31.50    Types: Cigarettes   Smokeless tobacco: Never   Tobacco comments:    wanting "some cinnamon" to help her stop  Vaping Use   Vaping Use: Never used  Substance Use Topics   Alcohol use: Not Currently    Alcohol/week: 2.0 standard drinks    Types: 1 Cans of beer, 1 Standard drinks or equivalent per week    Comment: occ   Drug use: Not Currently    Frequency: 1.0 times per week    Types: Cocaine    Comment: Occassional use    Family History  Problem Relation Age of Onset   Coronary artery disease Mother    Leukemia Father     No Known Allergies  Health Maintenance  Topic Date Due   Zoster Vaccines- Shingrix (1 of 2) Never  done   PAP SMEAR-Modifier  03/24/2009   MAMMOGRAM  Never done   Pneumococcal Vaccine 72-39 Years old (3 - PCV) 12/01/2010   COLONOSCOPY (Pts 45-58yrs Insurance coverage will need to be confirmed)  06/23/2018   COVID-19 Vaccine (2 - Janssen risk series) 06/30/2019   INFLUENZA VACCINE  08/21/2020   TETANUS/TDAP  10/08/2028   Hepatitis C Screening  Completed   HIV Screening  Completed   HPV VACCINES  Aged Out    Objective:  Vitals:   09/21/20 0925  BP: (!) 143/79  Pulse: 62  Temp: 98.5 F (36.9 C)  TempSrc: Oral  Weight: 154 lb (69.9 kg)   Body mass index is 31.1 kg/m.  Physical Exam Constitutional:      Comments: She is very pleasant and calm but obviously struggling because of her depression.  Cardiovascular:     Rate and Rhythm: Normal rate and regular rhythm.     Heart sounds: No murmur heard. Pulmonary:     Effort: Pulmonary effort is normal.     Breath sounds: Normal breath sounds.    Lab Results Lab Results  Component Value Date   WBC 6.0 08/09/2020   HGB 13.2 08/09/2020   HCT 38.6 08/09/2020   MCV 99.7 08/09/2020   PLT 145 08/09/2020    Lab Results  Component Value Date   CREATININE 0.75 08/09/2020   BUN 11 08/09/2020   NA 140 08/09/2020   K 3.7 08/09/2020   CL 105 08/09/2020   CO2 27 08/09/2020    Lab Results  Component Value Date   ALT 6 08/09/2020   AST 18 08/09/2020   ALKPHOS 66 04/05/2020   BILITOT 0.3 08/09/2020    Lab Results  Component Value Date   CHOL 245 (H) 08/09/2020   HDL 39 (L) 08/09/2020   LDLCALC 163 (H) 08/09/2020   TRIG 262 (H) 08/09/2020   CHOLHDL 6.3 (H) 08/09/2020   Lab Results  Component Value Date   LABRPR REACTIVE (A) 08/09/2020   RPRTITER 1:2 (H) 08/09/2020   HIV 1 RNA Quant  Date Value  08/09/2020 Not Detected Copies/mL  06/02/2019 <20 NOT DETECTED copies/mL  08/25/2018 <20 NOT DETECTED copies/mL   CD4 T Cell Abs (/uL)  Date Value  08/09/2020 1,430  06/02/2019 1,532  08/25/2018 1,662     Problem  List Items Addressed This Visit       High   Human immunodeficiency virus (HIV) disease (HCC)    Her infection remains under excellent, long-term control.  She will continue Genvoya and follow-up in 3 months.        Unprioritized  Bipolar affective disorder (HCC) (Chronic)    Her Depakote, Seroquel and Zoloft are all covered by ADAP.  I will send in refills today.  She has an appointment with our behavioral health counselor this morning.      Relevant Medications   QUEtiapine (SEROQUEL) 100 MG tablet   sertraline (ZOLOFT) 50 MG tablet   CIGARETTE SMOKER    I encouraged her to continue her efforts to cut down and quit smoking.      Cocaine abuse (HCC)    I congratulated her on her 61-month sobriety.      Depression   Relevant Medications   QUEtiapine (SEROQUEL) 100 MG tablet   sertraline (ZOLOFT) 50 MG tablet   divalproex (DEPAKOTE) 500 MG DR tablet      Cliffton Asters, MD [facility] for Infectious Disease [facility] [phone] pager   [phone] cell 09/21/2020, 9:52 AM

## 2020-09-21 NOTE — Assessment & Plan Note (Signed)
I congratulated her on her 64-month sobriety.

## 2020-09-21 NOTE — Assessment & Plan Note (Signed)
Her Depakote, Seroquel and Zoloft are all covered by ADAP.  I will send in refills today.  She has an appointment with our behavioral health counselor this morning.

## 2020-09-21 NOTE — Research (Signed)
Daisy Salinas was seen today for her month 60 visit for [employer]. She has returned to [facility] and now living with her daughter. She was off of study meds while she was gone but is now ready to start them back. She denies any new cardiac issues and is back on her psych meds and going to counseling. She will be returning in December for the next study visit.

## 2020-09-21 NOTE — Addendum Note (Signed)
Addended by: Cliffton Asters on: 09/21/2020 09:53 AM   Modules accepted: Orders

## 2020-09-21 NOTE — Telephone Encounter (Signed)
Called patient after Daisy Evener, RN informed me that pt is requesting her medication [facility].  Called [facility] to cancel prescriptions. Pharmacist states that patient has already filled her Rx today. If patient would like to continue filling with them she will have to pay for medication out of pocket.  Left voicemail with patient requesting she call office back to confirm which pharmacy she will be filling with.  Juanita Laster, RMA

## 2020-09-21 NOTE — Assessment & Plan Note (Signed)
Her infection remains under excellent, long-term control.  She will continue Genvoya and follow-up in 3 months.

## 2020-09-27 ENCOUNTER — Ambulatory Visit (INDEPENDENT_AMBULATORY_CARE_PROVIDER_SITE_OTHER): Payer: Self-pay | Admitting: Primary Care

## 2020-10-03 ENCOUNTER — Telehealth: Payer: Self-pay

## 2020-10-03 NOTE — Telephone Encounter (Signed)
Pt was sent a letter from financial dept. Inform them, that the application they submitted was incomplete, since they were missing some documentation at the time of the appointment, Pt need to reschedule and resubmit all new papers and application for [employer] and [employer], P.S. old documents has been sent back by mail to the Pt and Pt. need to make a new appt. 

## 2020-10-13 ENCOUNTER — Other Ambulatory Visit: Payer: Self-pay

## 2020-10-13 ENCOUNTER — Other Ambulatory Visit: Payer: Self-pay | Admitting: Internal Medicine

## 2020-10-13 DIAGNOSIS — F3342 Major depressive disorder, recurrent, in full remission: Secondary | ICD-10-CM

## 2020-10-13 DIAGNOSIS — F3176 Bipolar disorder, in full remission, most recent episode depressed: Secondary | ICD-10-CM

## 2020-10-18 ENCOUNTER — Other Ambulatory Visit: Payer: Self-pay

## 2020-11-22 ENCOUNTER — Other Ambulatory Visit: Payer: Self-pay | Admitting: Pharmacist

## 2021-01-03 ENCOUNTER — Ambulatory Visit (INDEPENDENT_AMBULATORY_CARE_PROVIDER_SITE_OTHER): Payer: Self-pay | Admitting: Internal Medicine

## 2021-01-03 ENCOUNTER — Other Ambulatory Visit: Payer: Self-pay

## 2021-01-03 DIAGNOSIS — B20 Human immunodeficiency virus [HIV] disease: Secondary | ICD-10-CM

## 2021-01-03 DIAGNOSIS — R197 Diarrhea, unspecified: Secondary | ICD-10-CM | POA: Insufficient documentation

## 2021-01-03 DIAGNOSIS — F3176 Bipolar disorder, in full remission, most recent episode depressed: Secondary | ICD-10-CM

## 2021-01-03 MED ORDER — GENVOYA 150-150-200-10 MG PO TABS: 1.0000 | ORAL_TABLET | Freq: Every day | ORAL | 11 refills | Status: DC

## 2021-01-03 MED ORDER — GENVOYA 150-150-200-10 MG PO TABS
1.0000 | ORAL_TABLET | Freq: Every day | ORAL | 11 refills | Status: DC
  Filled 2021-01-03: qty 30, 30d supply, fill #0

## 2021-01-03 NOTE — Progress Notes (Signed)
Patient Active Problem List   Diagnosis Date Noted   Grief reaction 05/31/2019    Priority: High   Chronic hepatitis C without hepatic coma (HCC) 09/08/2007    Priority: High   Human immunodeficiency virus (HIV) disease (HCC) 10/29/2005    Priority: High   Diarrhea 01/03/2021   History of latent tuberculosis 02/25/2018   Syncope 08/20/2017   Bipolar affective disorder (HCC) 08/20/2017   DJD (degenerative joint disease) 03/12/2016   Right hip pain 08/08/2015   Cocaine abuse (HCC) 11/30/2009   BRONCHITIS, CHRONIC 01/03/2009   CONSTIPATION, CHRONIC 05/09/2008   DE QUERVAIN'S TENOSYNOVITIS, LEFT WRIST 05/09/2008   HYSTERECTOMY, HX OF 05/13/2006   Depression 05/06/2006   SYPHILIS, LATE, LATENT 10/29/2005   DYSLIPIDEMIA 10/29/2005   CIGARETTE SMOKER 10/29/2005   Essential hypertension 10/29/2005   PROLAPSE, VAGINAL WALL NOS 10/29/2005   MENORRHAGIA 10/29/2005   INSOMNIA 10/29/2005    Patient's Medications  New Prescriptions   No medications on file  Previous Medications   AMLODIPINE (NORVASC) 5 MG TABLET    Take 1 tablet (5 mg total) by mouth daily.   DIVALPROEX (DEPAKOTE) 500 MG DR TABLET    Take 1 tablet (500 mg total) by mouth 2 (two) times daily.   QUETIAPINE (SEROQUEL) 100 MG TABLET    Take 1 tablet (100 mg total) by mouth at bedtime.   SERTRALINE (ZOLOFT) 50 MG TABLET    Take 1 tablet (50 mg total) by mouth daily.  Modified Medications   Modified Medication Previous Medication   ELVITEGRAVIR-COBICISTAT-EMTRICITABINE-TENOFOVIR (GENVOYA) 150-150-200-10 MG TABS TABLET elvitegravir-cobicistat-emtricitabine-tenofovir (GENVOYA) 150-150-200-10 MG TABS tablet      Take 1 tablet by mouth daily.    Take 1 tablet by mouth daily.  Discontinued Medications   No medications on file    Subjective: Daisy Salinas is in for her routine HIV follow-up visit.  She denies any problems obtaining, taking or tolerating her Genvoya and does not think she has missed any doses.  She is  not feeling anxious or depressed.  She has not had any relapse of drug use.  She is still smoking cigarettes, about a third of a pack each day.  She has no current plans to quit.  A woman who lives with her was diagnosed with COVID 4 days ago.  Hadlea took the [employer] in May of last year.  2 days ago she started having some aching in her thighs and diarrhea.  She has not had any change in her chronic smoker's cough.  She has not had any fever or shortness of breath.  Review of Systems: Review of Systems  Constitutional:  Negative for fever and weight loss.  Respiratory:  Positive for cough, sputum production and wheezing. Negative for shortness of breath.   Cardiovascular:  Negative for chest pain.  Gastrointestinal:  Positive for diarrhea. Negative for abdominal pain, nausea and vomiting.  Musculoskeletal:  Positive for myalgias.  Psychiatric/Behavioral:  Negative for depression and substance abuse. The patient is not nervous/anxious.    Past Medical History:  Diagnosis Date   Asthma    Bipolar 1 disorder (HCC) 2009   Depression    GERD (gastroesophageal reflux disease)    HIV (human immunodeficiency virus infection) (HCC)    Hypertension    TB (pulmonary tuberculosis) 1989   was exposed and treated   UTI (urinary tract infection) 1997    Social History   Tobacco Use   Smoking status: Every Day    Packs/day:  0.70    Years: 45.00    Pack years: 31.50    Types: Cigarettes   Smokeless tobacco: Never   Tobacco comments:    wanting "some cinnamon" to help her stop  Vaping Use   Vaping Use: Never used  Substance Use Topics   Alcohol use: Not Currently    Alcohol/week: 2.0 standard drinks    Types: 1 Cans of beer, 1 Standard drinks or equivalent per week    Comment: occ   Drug use: Not Currently    Frequency: 1.0 times per week    Types: Cocaine    Comment: Occassional use    Family History  Problem Relation Age of Onset   Coronary artery disease Mother     Leukemia Father     No Known Allergies  Health Maintenance  Topic Date Due   Zoster Vaccines- Shingrix (1 of 2) Never done   PAP SMEAR-Modifier  03/24/2009   MAMMOGRAM  Never done   Pneumococcal Vaccine 8-52 Years old (3 - PCV) 12/01/2010   COLONOSCOPY (Pts 45-36yrs Insurance coverage will need to be confirmed)  06/23/2018   COVID-19 Vaccine (2 - Janssen risk series) 06/30/2019   INFLUENZA VACCINE  08/21/2020   TETANUS/TDAP  10/08/2028   Hepatitis C Screening  Completed   HIV Screening  Completed   HPV VACCINES  Aged Out    Objective:  Vitals:   01/03/21 0831  BP: 133/84  Pulse: 73  Temp: 98.2 F (36.8 C)  TempSrc: Oral   There is no height or weight on file to calculate BMI.  Physical Exam Constitutional:      Comments: She is slightly worried about her recent symptoms but otherwise is in good spirits.  Cardiovascular:     Rate and Rhythm: Normal rate and regular rhythm.     Heart sounds: No murmur heard. Pulmonary:     Effort: Pulmonary effort is normal.     Breath sounds: Normal breath sounds.  Abdominal:     Palpations: Abdomen is soft.     Tenderness: There is no abdominal tenderness.  Psychiatric:        Mood and Affect: Mood normal.    Lab Results Lab Results  Component Value Date   WBC 6.0 08/09/2020   HGB 13.2 08/09/2020   HCT 38.6 08/09/2020   MCV 99.7 08/09/2020   PLT 145 08/09/2020    Lab Results  Component Value Date   CREATININE 0.75 08/09/2020   BUN 11 08/09/2020   NA 140 08/09/2020   K 3.7 08/09/2020   CL 105 08/09/2020   CO2 27 08/09/2020    Lab Results  Component Value Date   ALT 6 08/09/2020   AST 18 08/09/2020   ALKPHOS 66 04/05/2020   BILITOT 0.3 08/09/2020    Lab Results  Component Value Date   CHOL 245 (H) 08/09/2020   HDL 39 (L) 08/09/2020   LDLCALC 163 (H) 08/09/2020   TRIG 262 (H) 08/09/2020   CHOLHDL 6.3 (H) 08/09/2020   Lab Results  Component Value Date   LABRPR REACTIVE (A) 08/09/2020   RPRTITER 1:2 (H)  08/09/2020   HIV 1 RNA Quant  Date Value  08/09/2020 Not Detected Copies/mL  06/02/2019 <20 NOT DETECTED copies/mL  08/25/2018 <20 NOT DETECTED copies/mL   CD4 T Cell Abs (/uL)  Date Value  08/09/2020 1,430  06/02/2019 1,532  08/25/2018 1,662     Problem List Items Addressed This Visit       High   Human immunodeficiency  virus (HIV) disease (HCC)    Her adherence has been very good and her infection has been under excellent, long-term control.  She will get repeat blood work today, continue Genvoya and follow-up in 1 month.      Relevant Medications   elvitegravir-cobicistat-emtricitabine-tenofovir (GENVOYA) 150-150-200-10 MG TABS tablet   Other Relevant Orders   T-helper cell (CD4)- ([facility] clinic only)   HIV-1 RNA quant-no reflex-bld     Unprioritized   Bipolar affective disorder (HCC) (Chronic)    Her depression is in remission coincident with being drug-free..      Diarrhea    It is possible that she has breakthrough COVID infection.  I suggested that she go to her drugstore and test for COVID.  I asked her to let us know if she is developing worsening symptoms, especially shortness of breath.  She will follow-up.  4 weeks.         Cliffton Asters, MD [facility] for Infectious Disease [facility] [phone] pager   [phone] cell 01/03/2021, 8:56 AM

## 2021-01-03 NOTE — Assessment & Plan Note (Signed)
It is possible that she has breakthrough COVID infection.  I suggested that she go to her drugstore and test for COVID.  I asked her to let us know if she is developing worsening symptoms, especially shortness of breath.  She will follow-up.  4 weeks.

## 2021-01-03 NOTE — Assessment & Plan Note (Signed)
Her adherence has been very good and her infection has been under excellent, long-term control.  She will get repeat blood work today, continue Genvoya and follow-up in 1 month.

## 2021-01-03 NOTE — Assessment & Plan Note (Addendum)
Her depression is in remission coincident with being drug-free.Daisy Salinas

## 2021-01-03 NOTE — Progress Notes (Signed)
Pt requested medication be sent [facility] on [address]. New rx sent to preferred pharmacy. Canceled prescription sent to [facility].  Juanita Laster, RMA

## 2021-01-04 ENCOUNTER — Encounter (INDEPENDENT_AMBULATORY_CARE_PROVIDER_SITE_OTHER): Payer: Self-pay | Admitting: *Deleted

## 2021-01-04 ENCOUNTER — Other Ambulatory Visit: Payer: Self-pay

## 2021-01-04 DIAGNOSIS — Z006 Encounter for examination for normal comparison and control in clinical research program: Secondary | ICD-10-CM

## 2021-01-04 LAB — T-HELPER CELL (CD4) - (RCID CLINIC ONLY)
CD4 % Helper T Cell: 45 % (ref 33–65)
CD4 T Cell Abs: 1662 /uL (ref 400–1790)

## 2021-01-04 NOTE — Research (Signed)
Daisy Salinas was here for her month 64 visit for [employer]. She denies any current problems and depression is well controlled on her meds. She says her adherence is excellent. She will be seeing Dr. Orvan Falconer in a few weeks and plans to get her flu and covid booster then.She will return for the study in May.

## 2021-01-07 LAB — HIV-1 RNA QUANT-NO REFLEX-BLD
HIV 1 RNA Quant: NOT DETECTED Copies/mL
HIV-1 RNA Quant, Log: NOT DETECTED Log cps/mL

## 2021-01-31 ENCOUNTER — Ambulatory Visit: Payer: Self-pay | Admitting: Internal Medicine

## 2021-02-08 ENCOUNTER — Ambulatory Visit: Payer: Self-pay | Admitting: Internal Medicine

## 2021-02-22 ENCOUNTER — Encounter (HOSPITAL_COMMUNITY): Payer: Self-pay | Admitting: Emergency Medicine

## 2021-02-22 ENCOUNTER — Emergency Department (HOSPITAL_COMMUNITY)
Admission: EM | Admit: 2021-02-22 | Discharge: 2021-02-23 | Disposition: A | Discharge status: Home / Self Care | Payer: Self-pay | Attending: Emergency Medicine | Admitting: Emergency Medicine

## 2021-02-22 ENCOUNTER — Other Ambulatory Visit: Payer: Self-pay

## 2021-02-22 DIAGNOSIS — E876 Hypokalemia: Secondary | ICD-10-CM | POA: Insufficient documentation

## 2021-02-22 DIAGNOSIS — R091 Pleurisy: Secondary | ICD-10-CM | POA: Insufficient documentation

## 2021-02-22 DIAGNOSIS — R0602 Shortness of breath: Secondary | ICD-10-CM | POA: Insufficient documentation

## 2021-02-22 DIAGNOSIS — J45909 Unspecified asthma, uncomplicated: Secondary | ICD-10-CM | POA: Insufficient documentation

## 2021-02-22 DIAGNOSIS — I10 Essential (primary) hypertension: Secondary | ICD-10-CM | POA: Insufficient documentation

## 2021-02-22 DIAGNOSIS — Z79899 Other long term (current) drug therapy: Secondary | ICD-10-CM | POA: Insufficient documentation

## 2021-02-22 DIAGNOSIS — R911 Solitary pulmonary nodule: Secondary | ICD-10-CM | POA: Insufficient documentation

## 2021-02-22 NOTE — ED Provider Triage Note (Signed)
Emergency Medicine Provider Triage Evaluation Note  Devonta Carraro , a 61 y.o. female  was evaluated in triage.  Pt complains of left chest pain ongoing for several days. Pain worse with inspiration. Denies leg swelling, hemoptysis  Review of Systems  Positive: Chest pain Negative: Leg swelling, hemoptysis  Physical Exam  BP 118/74 (BP Location: Left Arm)    Pulse 70    Temp 98.5 F (36.9 C) (Oral)    Resp 20    Ht 4\' 11"  (1.499 m)    Wt 72 kg    SpO2 94%    BMI 32.06 kg/m  Gen:   Awake, no distress   Resp:  Normal effort  MSK:   Moves extremities without difficulty  Other:  Heart with rrr, lungs ctab  Medical Decision Making  Medically screening exam initiated at 11:52 PM.  Appropriate orders placed.  Idonna Knoche was informed that the remainder of the evaluation will be completed by another provider, this initial triage assessment does not replace that evaluation, and the importance of remaining in the ED until their evaluation is complete.     Rodney Booze, PA-C 02/22/21 2355

## 2021-02-22 NOTE — ED Triage Notes (Signed)
Patient reports left lower chest pain for 2 days with mild SOB , no emesis or diaphoresis , denies cough or fever .

## 2021-02-23 ENCOUNTER — Emergency Department (HOSPITAL_COMMUNITY): Payer: Self-pay

## 2021-02-23 LAB — CBC WITH DIFFERENTIAL/PLATELET
Abs Immature Granulocytes: 0.01 10*3/uL (ref 0.00–0.07)
Basophils Absolute: 0 10*3/uL (ref 0.0–0.1)
Basophils Relative: 0 %
Eosinophils Absolute: 0.1 10*3/uL (ref 0.0–0.5)
Eosinophils Relative: 1 %
HCT: 37.6 % (ref 36.0–46.0)
Hemoglobin: 12.5 g/dL (ref 12.0–15.0)
Immature Granulocytes: 0 %
Lymphocytes Relative: 62 %
Lymphs Abs: 4.4 10*3/uL — ABNORMAL HIGH (ref 0.7–4.0)
MCH: 33.2 pg (ref 26.0–34.0)
MCHC: 33.2 g/dL (ref 30.0–36.0)
MCV: 99.7 fL (ref 80.0–100.0)
Monocytes Absolute: 0.5 10*3/uL (ref 0.1–1.0)
Monocytes Relative: 8 %
Neutro Abs: 2.1 10*3/uL (ref 1.7–7.7)
Neutrophils Relative %: 29 %
Platelets: 124 10*3/uL — ABNORMAL LOW (ref 150–400)
RBC: 3.77 MIL/uL — ABNORMAL LOW (ref 3.87–5.11)
RDW: 13.8 % (ref 11.5–15.5)
WBC: 7.1 10*3/uL (ref 4.0–10.5)
nRBC: 0 % (ref 0.0–0.2)

## 2021-02-23 LAB — COMPREHENSIVE METABOLIC PANEL
ALT: 8 U/L (ref 0–44)
AST: 21 U/L (ref 15–41)
Albumin: 3.6 g/dL (ref 3.5–5.0)
Alkaline Phosphatase: 51 U/L (ref 38–126)
Anion gap: 11 (ref 5–15)
BUN: 7 mg/dL — ABNORMAL LOW (ref 8–23)
CO2: 22 mmol/L (ref 22–32)
Calcium: 9.3 mg/dL (ref 8.9–10.3)
Chloride: 106 mmol/L (ref 98–111)
Creatinine, Ser: 0.78 mg/dL (ref 0.44–1.00)
GFR, Estimated: >60 mL/min (ref >60)
Glucose, Bld: 109 mg/dL — ABNORMAL HIGH (ref 70–99)
Potassium: 3.2 mmol/L — ABNORMAL LOW (ref 3.5–5.1)
Sodium: 139 mmol/L (ref 135–145)
Total Bilirubin: 0.3 mg/dL (ref 0.3–1.2)
Total Protein: 7.2 g/dL (ref 6.5–8.1)

## 2021-02-23 LAB — LIPASE, BLOOD: Lipase: 33 U/L (ref 11–51)

## 2021-02-23 LAB — TROPONIN I (HIGH SENSITIVITY)
Troponin I (High Sensitivity): 5 ng/L (ref <18)
Troponin I (High Sensitivity): 6 ng/L (ref <18)

## 2021-02-23 MED ORDER — KETOROLAC TROMETHAMINE 30 MG/ML IJ SOLN
30.0000 mg | Freq: Once | INTRAMUSCULAR | Status: AC
  Administered 2021-02-23: 30 mg via INTRAVENOUS
  Filled 2021-02-23: qty 1

## 2021-02-23 MED ORDER — IOHEXOL 350 MG/ML SOLN
68.0000 mL | Freq: Once | INTRAVENOUS | Status: AC | PRN
  Administered 2021-02-23: 68 mL via INTRAVENOUS

## 2021-02-23 MED ORDER — HYDROCODONE-ACETAMINOPHEN 5-325 MG PO TABS
1.0000 | ORAL_TABLET | Freq: Once | ORAL | Status: AC
  Administered 2021-02-23: 1 via ORAL
  Filled 2021-02-23: qty 1

## 2021-02-23 NOTE — ED Provider Notes (Signed)
[facility] EMERGENCY DEPARTMENT Provider Note   CSN: [id number] Arrival date & time: 02/22/21  2313     History  Chief Complaint - chest pain   Daisy Salinas is a 61 y.o. female.  The history is provided by the patient.  Chest Pain Pain location:  L chest Pain quality: sharp   Pain radiates to:  Does not radiate Pain severity:  Moderate Onset quality:  Sudden Duration:  2 days Timing:  Intermittent Progression:  Unchanged Chronicity:  New Context: breathing   Relieved by:  Nothing Worsened by:  Deep breathing Associated symptoms: shortness of breath   Associated symptoms: no abdominal pain and no fever   Associated symptoms comment:  Denies hemoptysis Patient with history of asthma, bipolar, HIV, hypertension presents with chest pain.  She reports left-sided sharp chest pain is worse with breathing in the past 2 days.  No hemoptysis.  No fevers.   Past Medical History:  Diagnosis Date   Asthma    Bipolar 1 disorder ([place]) 2009   Depression    GERD (gastroesophageal reflux disease)    HIV (human immunodeficiency virus infection) ([place])    Hypertension    TB (pulmonary tuberculosis) 1989   was exposed and treated   UTI (urinary tract infection) 1997    Home Medications Prior to Admission medications   Medication Sig Start Date End Date Taking? Authorizing Provider  amLODipine (NORVASC) 5 MG tablet Take 1 tablet (5 mg total) by mouth daily. 09/21/20   Michel Bickers, MD  divalproex (DEPAKOTE) 500 MG DR tablet Take 1 tablet (500 mg total) by mouth 2 (two) times daily. 09/21/20   Michel Bickers, MD  elvitegravir-cobicistat-emtricitabine-tenofovir (GENVOYA) 150-150-200-10 MG TABS tablet Take 1 tablet by mouth daily. 01/03/21   Michel Bickers, MD  QUEtiapine (SEROQUEL) 100 MG tablet Take 1 tablet (100 mg total) by mouth at bedtime. 09/21/20   Michel Bickers, MD  sertraline (ZOLOFT) 50 MG tablet Take 1 tablet (50 mg total) by mouth daily. 09/21/20   Michel Bickers, MD      Allergies    Patient has no known allergies.    Review of Systems   Review of Systems  Constitutional:  Negative for fever.  Respiratory:  Positive for shortness of breath.   Cardiovascular:  Positive for chest pain.  Gastrointestinal:  Negative for abdominal pain.  All other systems reviewed and are negative.  Physical Exam Updated Vital Signs BP 123/66    Pulse (!) 51    Temp 98.5 F (36.9 C) (Oral)    Resp 19    Ht 1.499 m (4\' 11" )    Wt 72 kg    SpO2 98%    BMI 32.06 kg/m  Physical Exam CONSTITUTIONAL: Well developed/well nourished, resting comfortably HEAD: Normocephalic/atraumatic EYES: EOMI NECK: supple no meningeal signs SPINE/BACK:entire spine nontender CV: S1/S2 noted, no murmurs/rubs/gallops noted LUNGS: Lungs are clear to auscultation bilaterally, no apparent distress Chest-no bruising or erythema noted to the chest, no rash ABDOMEN: soft, nontender NEURO: Pt is awake/alert/appropriate, moves all extremitiesx4.  No facial droop.   EXTREMITIES: pulses normal/equal, full ROM, no calf tenderness or edema SKIN: warm, color normal PSYCH: no abnormalities of mood noted, alert and oriented to situation  ED Results / Procedures / Treatments   Labs (all labs ordered are listed, but only abnormal results are displayed) Labs Reviewed  CBC WITH DIFFERENTIAL/PLATELET - Abnormal; Notable for the following components:      Result Value   RBC 3.77 (*)  Platelets 124 (*)    Lymphs Abs 4.4 (*)    All other components within normal limits  COMPREHENSIVE METABOLIC PANEL - Abnormal; Notable for the following components:   Potassium 3.2 (*)    Glucose, Bld 109 (*)    BUN 7 (*)    All other components within normal limits  LIPASE, BLOOD  TROPONIN I (HIGH SENSITIVITY)  TROPONIN I (HIGH SENSITIVITY)    EKG EKG Interpretation  Date/Time:  Friday February 23 2021 00:25:45 EST Ventricular Rate:  70 PR Interval:  140 QRS Duration: 70 QT Interval:  442 QTC  Calculation: 477 R Axis:   73 Text Interpretation: Normal sinus rhythm Nonspecific ST and T wave abnormality Prolonged QT Abnormal ECG Interpretation limited secondary to artifact Confirmed by Zadie Rhine ([id number]) on 02/23/2021 1:35:32 AM  Radiology DG Chest 2 View  Result Date: 02/23/2021 CLINICAL DATA:  Chest pain, dyspnea EXAM: CHEST - 2 VIEW COMPARISON:  02/25/2018 FINDINGS: The heart size and mediastinal contours are within normal limits. Both lungs are clear. The visualized skeletal structures are unremarkable. IMPRESSION: No active cardiopulmonary disease. Electronically Signed   By: Helyn Numbers M.D.   On: 02/23/2021 00:17   CT Angio Chest PE W/Cm &/Or Wo Cm  Result Date: 02/23/2021 CLINICAL DATA:  Pulmonary embolism suspected, high probability. EXAM: CT ANGIOGRAPHY CHEST WITH CONTRAST TECHNIQUE: Multidetector CT imaging of the chest was performed using the standard protocol during bolus administration of intravenous contrast. Multiplanar CT image reconstructions and MIPs were obtained to evaluate the vascular anatomy. RADIATION DOSE REDUCTION: This exam was performed according to the departmental dose-optimization program which includes automated exposure control, adjustment of the mA and/or kV according to patient size and/or use of iterative reconstruction technique. CONTRAST:  29mL OMNIPAQUE IOHEXOL 350 MG/ML SOLN COMPARISON:  01/06/2018. FINDINGS: Cardiovascular: The heart is enlarged and there is no pericardial effusion. The aorta is normal in caliber. The pulmonary trunk is mildly distended which may be associated with underlying pulmonary artery hypertension. No pulmonary artery filling defect is identified. Mediastinum/Nodes: No mediastinal or hilar lymphadenopathy. A few prominent lymph nodes are present in the axillary regions bilaterally and unchanged from 2019. The thyroid gland, trachea, and esophagus are unremarkable. Lungs/Pleura: Emphysematous changes are present in the lungs  bilaterally. Dependent atelectasis is noted. No effusion or pneumothorax. There is a 8 mm subpleural nodular opacity in the left upper lobe, axial image 37. Upper Abdomen: There is reflux of contrast into the inferior vena cava and hepatic veins which may be associated with right heart failure. No acute abnormality is identified. Musculoskeletal: Mild degenerative changes in the thoracic spine. No acute osseous abnormality. Review of the MIP images confirms the above findings. IMPRESSION: 1. No evidence of pulmonary embolism or other acute process in the chest. 2. 8 mm subpleural nodule in the left upper lobe. Non-contrast chest CT at 6-12 months is recommended. If the nodule is stable at time of repeat CT, then future CT at 18-24 months (from today's scan) is considered optional for low-risk patients, but is recommended for high-risk patients. This recommendation follows the consensus statement: Guidelines for Management of Incidental Pulmonary Nodules Detected on CT Images: From the Fleischner Society 2017; Radiology 2017; 284:228-243. 3. Emphysema. 4. Cardiomegaly. 5. Mildly distended pulmonary trunk which may be associated with underlying pulmonary artery hypertension. There is reflux of contrast into the inferior vena cava and hepatic veins, which may be associated with right heart failure. 6. Prominent axillary lymph nodes bilaterally, unchanged from 2019. Electronically Signed  By: Brett Fairy M.D.   On: 02/23/2021 02:12    Procedures Procedures    Medications Ordered in ED Medications  iohexol (OMNIPAQUE) 350 MG/ML injection 68 mL (68 mLs Intravenous Contrast Given 02/23/21 0155)  ketorolac (TORADOL) 30 MG/ML injection 30 mg (30 mg Intravenous Given 02/23/21 0319)    ED Course/ Medical Decision Making/ A&P Clinical Course as of 02/23/21 0511  Fri Feb 23, 2021  0314 Potassium(!): 3.2 Mild hypokalemia [DW]  0510 Patient stable, no acute issues, she will be discharged home. Low suspicion for  ACS/PE/dissection at this time [DW]    Clinical Course User Index [DW] Ripley Fraise, MD           HEART Score: 3                Medical Decision Making Amount and/or Complexity of Data Reviewed Labs:  Decision-making details documented in ED Course.   This patient presents to the ED for concern of chest pain, this involves an extensive number of treatment options, and is a complaint that carries with it a high risk of complications and morbidity.  The differential diagnosis includes acute coronary syndrome, PE, aortic dissection, pneumonia, pneumothorax  Comorbidities that complicate the patient evaluation: Patients presentation is complicated by their history of hypertension  Social determinants of health-previous substance use  Additional history obtained: Records reviewed infectious disease outpatient notes reviewed  Lab Tests: I Ordered, and personally interpreted labs.  The pertinent results include: Mild hypokalemia  Imaging Studies ordered: I ordered imaging studies including CT scan chest and X-ray chest I independently visualized and interpreted imaging which showed chest x-ray is negative CT chest results revealed no PE, pulmonary nodule I agree with the radiologist interpretation  Cardiac Monitoring: The patient was maintained on a cardiac monitor.  I personally viewed and interpreted the cardiac monitor which showed an underlying rhythm of:  sinus rhythm  Medicines ordered and prescription drug management: I ordered medication including Toradol for pain Reevaluation of the patient after these medicines showed that the patient    improved  Test Considered: Patient is low risk / negative by heart score, therefore do not feel that admission is indicated.   Reevaluation: After the interventions noted above, I reevaluated the patient and found that they have :improved  Complexity of problems addressed: Patients presentation is most consistent with  acute  complicated illness/injury requiring diagnostic workup      Disposition: After consideration of the diagnostic results and the patients response to treatment,  I feel that the patent would benefit from discharge .    Patient is safe for discharge home.  No acute issues in the ED. Will need outpatient follow-up CT scan of her chest to evaluate for pulmonary nodules, patient understands this        Final Clinical Impression(s) / ED Diagnoses Final diagnoses:  Pleurisy  Lung nodule    Rx / DC Orders ED Discharge Orders     None         Ripley Fraise, MD 02/23/21 [phone]

## 2021-02-23 NOTE — Discharge Instructions (Signed)
You will need a CAT scan in 6 months of your lungs to make sure the lung nodule has not progressed to cancer Please see Dr. Orvan Falconer for this issue

## 2021-05-31 ENCOUNTER — Telehealth: Payer: Self-pay

## 2021-05-31 NOTE — Telephone Encounter (Signed)
Received notification from [facility] that ADAP coverage expired 3/31 - called patient to set up appointment with financial counselor, no answer. Left HIPAA compliant voicemail requesting callback.  ? ?Sandie Ano, RN ? ?

## 2021-06-05 DIAGNOSIS — R911 Solitary pulmonary nodule: Secondary | ICD-10-CM | POA: Insufficient documentation

## 2021-06-06 ENCOUNTER — Encounter: Payer: Self-pay | Admitting: Internal Medicine

## 2021-06-06 ENCOUNTER — Other Ambulatory Visit (HOSPITAL_COMMUNITY): Payer: Self-pay

## 2021-06-06 ENCOUNTER — Other Ambulatory Visit: Payer: Self-pay

## 2021-06-06 ENCOUNTER — Ambulatory Visit (INDEPENDENT_AMBULATORY_CARE_PROVIDER_SITE_OTHER): Payer: Self-pay | Admitting: Internal Medicine

## 2021-06-06 DIAGNOSIS — R911 Solitary pulmonary nodule: Secondary | ICD-10-CM

## 2021-06-06 DIAGNOSIS — B20 Human immunodeficiency virus [HIV] disease: Secondary | ICD-10-CM

## 2021-06-06 DIAGNOSIS — F1410 Cocaine abuse, uncomplicated: Secondary | ICD-10-CM

## 2021-06-06 DIAGNOSIS — F3132 Bipolar disorder, current episode depressed, moderate: Secondary | ICD-10-CM

## 2021-06-06 MED ORDER — GENVOYA 150-150-200-10 MG PO TABS
1.0000 | ORAL_TABLET | Freq: Every day | ORAL | 11 refills | Status: DC
  Filled 2021-06-06: qty 30, 30d supply, fill #0

## 2021-06-06 MED ORDER — GENVOYA 150-150-200-10 MG PO TABS: 1.0000 | ORAL_TABLET | Freq: Every day | ORAL | 11 refills | Status: DC

## 2021-06-06 NOTE — Addendum Note (Signed)
Addended by: Clayborne Artist A on: 06/06/2021 10:55 AM ? ? Modules accepted: Orders ? ?

## 2021-06-06 NOTE — Assessment & Plan Note (Signed)
I reminded her that she always tells me that she feels much better when she is sober.  I encouraged her to get back with regular NA meetings. ?

## 2021-06-06 NOTE — Assessment & Plan Note (Signed)
I encouraged her to go back to [facility] the [place] and rejoin the group therapy sessions. ?

## 2021-06-06 NOTE — Assessment & Plan Note (Signed)
Her infection has been under excellent, long-term control.  I reminded her to always let us know right away if she cannot get access to [facility].  We gave her 2 weeks of samples of Biktarvy.  She will reapply for ADAP today and restart Genvoya once it is approved.  She will get blood work here today and follow-up in 4 weeks.  A referral to [facility] to see if they can help with housing. ?

## 2021-06-06 NOTE — Progress Notes (Signed)
? ?   ? ? ? ? ?Patient Active Problem List  ? Diagnosis Date Noted  ? Grief reaction 05/31/2019  ?  Priority: High  ? Chronic hepatitis C without hepatic coma (HCC) 09/08/2007  ?  Priority: High  ? Human immunodeficiency virus (HIV) disease (HCC) 10/29/2005  ?  Priority: High  ? Pulmonary nodule, left 06/05/2021  ? Diarrhea 01/03/2021  ? History of latent tuberculosis 02/25/2018  ? Syncope 08/20/2017  ? Bipolar affective disorder (HCC) 08/20/2017  ? DJD (degenerative joint disease) 03/12/2016  ? Right hip pain 08/08/2015  ? Cocaine abuse (HCC) 11/30/2009  ? BRONCHITIS, CHRONIC 01/03/2009  ? CONSTIPATION, CHRONIC 05/09/2008  ? DE QUERVAIN'S TENOSYNOVITIS, LEFT WRIST 05/09/2008  ? HYSTERECTOMY, HX OF 05/13/2006  ? Depression 05/06/2006  ? SYPHILIS, LATE, LATENT 10/29/2005  ? DYSLIPIDEMIA 10/29/2005  ? CIGARETTE SMOKER 10/29/2005  ? Essential hypertension 10/29/2005  ? PROLAPSE, VAGINAL WALL NOS 10/29/2005  ? MENORRHAGIA 10/29/2005  ? INSOMNIA 10/29/2005  ? ? ?Patient's Medications  ?New Prescriptions  ? No medications on file  ?Previous Medications  ? AMLODIPINE (NORVASC) 5 MG TABLET    Take 1 tablet (5 mg total) by mouth daily.  ? DIVALPROEX (DEPAKOTE) 500 MG DR TABLET    Take 1 tablet (500 mg total) by mouth 2 (two) times daily.  ? QUETIAPINE (SEROQUEL) 100 MG TABLET    Take 1 tablet (100 mg total) by mouth at bedtime.  ? SERTRALINE (ZOLOFT) 50 MG TABLET    Take 1 tablet (50 mg total) by mouth daily.  ?Modified Medications  ? Modified Medication Previous Medication  ? ELVITEGRAVIR-COBICISTAT-EMTRICITABINE-TENOFOVIR (GENVOYA) 150-150-200-10 MG TABS TABLET elvitegravir-cobicistat-emtricitabine-tenofovir (GENVOYA) 150-150-200-10 MG TABS tablet  ?    Take 1 tablet by mouth daily.    Take 1 tablet by mouth daily.  ?Discontinued Medications  ? No medications on file  ? ? ?Subjective: ?Soila is in for her routine HIV follow-up visit.  She ran out of of Genvoya 5 days ago because she did not recertify for ADAP.  A  girlfriend gave her 2 doses of Genvoya which she took.  She has not had any Genvoya for the last 3 days.  She recently lost her section 8 housing because she was not reporting income from her job.  She was living with her daughter for period of time but they had an argument and she is now living temporarily with her granddaughter.  She repeatedly refers to herself as being "homeless".  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  She says that she has been feeling more depressed and "fell off" and has been using cocaine again.  She stopped going to [employer].  Last fall she had been going to group meetings at [facility] of the [place].  She tells me that the group sessions were very helpful but she recently stopped going. ? ?Review of Systems: ?Review of Systems  ?Constitutional:  Negative for fever and weight loss.  ?Respiratory:  Positive for cough.   ?Cardiovascular:  Negative for chest pain.  ?Psychiatric/Behavioral:  Positive for depression and substance abuse. Negative for suicidal ideas.   ? ?Past Medical History:  ?Diagnosis Date  ? Asthma   ? Bipolar 1 disorder (HCC) 2009  ? Depression   ? GERD (gastroesophageal reflux disease)   ? HIV (human immunodeficiency virus infection) (HCC)   ? Hypertension   ? TB (pulmonary tuberculosis) 1989  ? was exposed and treated  ? UTI (urinary tract  infection) 1997  ? ? ?Social History  ? ?Tobacco Use  ? Smoking status: Every Day  ?  Packs/day: 0.70  ?  Years: 45.00  ?  Pack years: 31.50  ?  Types: Cigarettes  ? Smokeless tobacco: Never  ? Tobacco comments:  ?  wanting "some cinnamon" to help her stop  ?Vaping Use  ? Vaping Use: Never used  ?Substance Use Topics  ? Alcohol use: Not Currently  ?  Alcohol/week: 2.0 standard drinks  ?  Types: 1 Cans of beer, 1 Standard drinks or equivalent per week  ?  Comment: occ  ? Drug use: Not Currently  ?  Frequency: 1.0 times per week  ?  Types: Cocaine  ?  Comment: Occassional use  ? ? ?Family  History  ?Problem Relation Age of Onset  ? Coronary artery disease Mother   ? Leukemia Father   ? ? ?No Known Allergies ? ?Health Maintenance  ?Topic Date Due  ? Zoster Vaccines- Shingrix (1 of 2) Never done  ? PAP SMEAR-Modifier  03/24/2009  ? MAMMOGRAM  Never done  ? COLONOSCOPY (Pts 45-8130yrs Insurance coverage will need to be confirmed)  06/23/2018  ? COVID-19 Vaccine (2 - Janssen risk series) 06/30/2019  ? INFLUENZA VACCINE  08/21/2021  ? TETANUS/TDAP  10/08/2028  ? Hepatitis C Screening  Completed  ? HIV Screening  Completed  ? HPV VACCINES  Aged Out  ? ? ?Objective: ? ?Vitals:  ? 06/06/21 0940  ?BP: 136/84  ?Pulse: 63  ?Resp: 16  ?SpO2: 98%  ?Weight: 149 lb (67.6 kg)  ?Height: 4\' 11"  (1.499 m)  ? ?Body mass index is 30.09 kg/m?. ? ?Physical Exam ?Constitutional:   ?   Comments: She is very talkative and open about her recent struggles.  ?Cardiovascular:  ?   Rate and Rhythm: Normal rate and regular rhythm.  ?   Heart sounds: No murmur heard. ?Pulmonary:  ?   Effort: Pulmonary effort is normal.  ?   Breath sounds: Normal breath sounds.  ?Psychiatric:     ?   Thought Content: Thought content normal.     ?   Judgment: Judgment normal.  ?   Comments: She has a very flat affect.  ? ? ?Lab Results ?Lab Results  ?Component Value Date  ? WBC 7.1 02/22/2021  ? HGB 12.5 02/22/2021  ? HCT 37.6 02/22/2021  ? MCV 99.7 02/22/2021  ? PLT 124 (L) 02/22/2021  ?  ?Lab Results  ?Component Value Date  ? CREATININE 0.78 02/22/2021  ? BUN 7 (L) 02/22/2021  ? NA 139 02/22/2021  ? K 3.2 (L) 02/22/2021  ? CL 106 02/22/2021  ? CO2 22 02/22/2021  ?  ?Lab Results  ?Component Value Date  ? ALT 8 02/22/2021  ? AST 21 02/22/2021  ? ALKPHOS 51 02/22/2021  ? BILITOT 0.3 02/22/2021  ?  ?Lab Results  ?Component Value Date  ? CHOL 245 (H) 08/09/2020  ? HDL 39 (L) 08/09/2020  ? LDLCALC 163 (H) 08/09/2020  ? TRIG 262 (H) 08/09/2020  ? CHOLHDL 6.3 (H) 08/09/2020  ? ?Lab Results  ?Component Value Date  ? LABRPR REACTIVE (A) 08/09/2020  ? RPRTITER  1:2 (H) 08/09/2020  ? ?HIV 1 RNA Quant  ?Date Value  ?01/03/2021 Not Detected Copies/mL  ?08/09/2020 Not Detected Copies/mL  ?06/02/2019 <20 NOT DETECTED copies/mL  ? ?CD4 T Cell Abs (/uL)  ?Date Value  ?01/03/2021 1,662  ?08/09/2020 1,430  ?06/02/2019 1,532  ? ?  ?Problem List Items Addressed  This Visit   ? ?  ? High  ? Human immunodeficiency virus (HIV) disease (HCC)  ?  Her infection has been under excellent, long-term control.  I reminded her to always let us know right away if she cannot get access to [facility].  We gave her 2 weeks of samples of Biktarvy.  She will reapply for ADAP today and restart Genvoya once it is approved.  She will get blood work here today and follow-up in 4 weeks.  A referral to [facility] to see if they can help with housing. ? ?  ?  ? Relevant Medications  ? elvitegravir-cobicistat-emtricitabine-tenofovir (GENVOYA) 150-150-200-10 MG TABS tablet  ? Other Relevant Orders  ? T-helper cells (CD4) count (not at [facility])  ? HIV-1 RNA quant-no reflex-bld  ? CBC  ? Comprehensive metabolic panel  ? RPR  ? Lipid panel  ?  ? Unprioritized  ? Bipolar affective disorder (HCC) (Chronic)  ?  I encouraged her to go back to [facility] the [place] and rejoin the group therapy sessions. ? ?  ?  ? Cocaine abuse (HCC)  ?  I reminded her that she always tells me that she feels much better when she is sober.  I encouraged her to get back with regular NA meetings. ? ?  ?  ? Pulmonary nodule, left  ? ? ? ? ?Cliffton Asters, MD ?[facility] for Infectious Disease ?[facility] ?[phone] pager   [phone] cell ?06/06/2021, 10:16 AM ? ?

## 2021-06-08 ENCOUNTER — Other Ambulatory Visit: Payer: Self-pay | Admitting: Pharmacist

## 2021-06-08 DIAGNOSIS — B20 Human immunodeficiency virus [HIV] disease: Secondary | ICD-10-CM

## 2021-06-08 LAB — T-HELPER CELLS (CD4) COUNT (NOT AT ARMC)
CD4 % Helper T Cell: 45 % (ref 33–65)
CD4 T Cell Abs: 1696 /uL (ref 400–1790)

## 2021-06-08 MED ORDER — BICTEGRAVIR-EMTRICITAB-TENOFOV 50-200-25 MG PO TABS: 1.0000 | ORAL_TABLET | Freq: Every day | ORAL | 0 refills | Status: AC

## 2021-06-08 NOTE — Progress Notes (Signed)
Medication Samples have been provided to the patient.  Drug name: Biktarvy        Strength: 50/200/25 mg       Qty: 14 tablets (2 bottles) LOT: [id number]   Exp.Date: 9/25  Dosing instructions: Take one tablet by mouth once daily  The patient has been instructed regarding the correct time, dose, and frequency of taking this medication, including desired effects and most common side effects.   Dajana Gehrig, PharmD, CPP Clinical Pharmacist Practitioner Infectious Diseases Clinical Pharmacist [facility]  

## 2021-06-13 ENCOUNTER — Other Ambulatory Visit: Payer: Self-pay

## 2021-06-13 ENCOUNTER — Encounter (INDEPENDENT_AMBULATORY_CARE_PROVIDER_SITE_OTHER): Payer: Self-pay

## 2021-06-13 VITALS — BP 156/90 | HR 57 | Temp 98.3°F | Wt 146.7 lb

## 2021-06-13 DIAGNOSIS — Z006 Encounter for examination for normal comparison and control in clinical research program: Secondary | ICD-10-CM

## 2021-06-13 NOTE — Research (Addendum)
Participant seen for final visit for Reprieve [phone]. No study medications to return as they were lost 3 weeks ago per participant report. Overall, participant reports doing well and declines any new issues or concerns. States she is back to living with her daughter, has restarted her Jorje Guild and denies any Cocaine use for the past several days. Has a follow up appointment with [facility] next month.  Encouraged participant to discuss statin therapy given the end of study.

## 2021-06-20 LAB — RPR: RPR Ser Ql: REACTIVE — AB

## 2021-06-20 LAB — CBC
HCT: 37.8 % (ref 35.0–45.0)
Hemoglobin: 13 g/dL (ref 11.7–15.5)
MCH: 33.6 pg — ABNORMAL HIGH (ref 27.0–33.0)
MCHC: 34.4 g/dL (ref 32.0–36.0)
MCV: 97.7 fL (ref 80.0–100.0)
MPV: 12.4 fL (ref 7.5–12.5)
Platelets: 152 10*3/uL (ref 140–400)
RBC: 3.87 10*6/uL (ref 3.80–5.10)
RDW: 13.6 % (ref 11.0–15.0)
WBC: 7.2 10*3/uL (ref 3.8–10.8)

## 2021-06-20 LAB — COMPREHENSIVE METABOLIC PANEL
AG Ratio: 1.3 (calc) (ref 1.0–2.5)
ALT: 5 U/L — ABNORMAL LOW (ref 6–29)
AST: 19 U/L (ref 10–35)
Albumin: 4.3 g/dL (ref 3.6–5.1)
Alkaline phosphatase (APISO): 65 U/L (ref 37–153)
BUN: 9 mg/dL (ref 7–25)
CO2: 27 mmol/L (ref 20–32)
Calcium: 9.2 mg/dL (ref 8.6–10.4)
Chloride: 107 mmol/L (ref 98–110)
Creat: 0.66 mg/dL (ref 0.50–1.05)
Globulin: 3.4 g/dL (calc) (ref 1.9–3.7)
Glucose, Bld: 87 mg/dL (ref 65–99)
Potassium: 3.6 mmol/L (ref 3.5–5.3)
Sodium: 143 mmol/L (ref 135–146)
Total Bilirubin: 0.4 mg/dL (ref 0.2–1.2)
Total Protein: 7.7 g/dL (ref 6.1–8.1)

## 2021-06-20 LAB — LIPID PANEL
Cholesterol: 196 mg/dL (ref <200)
HDL: 33 mg/dL — ABNORMAL LOW (ref >=50)
LDL Cholesterol (Calc): 127 mg/dL (calc) — ABNORMAL HIGH
Non-HDL Cholesterol (Calc): 163 mg/dL (calc) — ABNORMAL HIGH (ref <130)
Total CHOL/HDL Ratio: 5.9 (calc) — ABNORMAL HIGH (ref <5.0)
Triglycerides: 224 mg/dL — ABNORMAL HIGH (ref <150)

## 2021-06-20 LAB — FLUORESCENT TREPONEMAL AB(FTA)-IGG-BLD: Fluorescent Treponemal ABS: REACTIVE — AB

## 2021-06-20 LAB — HIV-1 RNA QUANT-NO REFLEX-BLD

## 2021-06-20 LAB — RPR TITER: RPR Titer: 1:1 {titer} — ABNORMAL HIGH

## 2021-07-04 ENCOUNTER — Ambulatory Visit: Payer: Self-pay | Admitting: Internal Medicine

## 2021-08-14 ENCOUNTER — Other Ambulatory Visit: Payer: Self-pay

## 2021-08-14 ENCOUNTER — Encounter: Payer: Self-pay | Admitting: Internal Medicine

## 2021-08-14 ENCOUNTER — Ambulatory Visit: Payer: Self-pay

## 2021-08-14 ENCOUNTER — Ambulatory Visit (INDEPENDENT_AMBULATORY_CARE_PROVIDER_SITE_OTHER): Payer: Self-pay | Admitting: Internal Medicine

## 2021-08-14 DIAGNOSIS — Z5900 Homelessness unspecified: Secondary | ICD-10-CM

## 2021-08-14 DIAGNOSIS — F331 Major depressive disorder, recurrent, moderate: Secondary | ICD-10-CM

## 2021-08-14 DIAGNOSIS — B20 Human immunodeficiency virus [HIV] disease: Secondary | ICD-10-CM

## 2021-08-14 NOTE — Assessment & Plan Note (Signed)
We will have one of our [facility] case managers reach out to her.

## 2021-08-14 NOTE — Assessment & Plan Note (Signed)
I encouraged her to rejoin the group counseling sessions at [facility] of the [place].

## 2021-08-14 NOTE — Assessment & Plan Note (Signed)
I will recheck her viral load today now that she has been back on Genvoya for 2 months.

## 2021-08-14 NOTE — Progress Notes (Signed)
Patient Active Problem List   Diagnosis Date Noted   Grief reaction 05/31/2019    Priority: High   Chronic hepatitis C without hepatic coma (HCC) 09/08/2007    Priority: High   Human immunodeficiency virus (HIV) disease (HCC) 10/29/2005    Priority: High   Homeless 08/14/2021   Pulmonary nodule, left 06/05/2021   Diarrhea 01/03/2021   History of latent tuberculosis 02/25/2018   Syncope 08/20/2017   Bipolar affective disorder (HCC) 08/20/2017   DJD (degenerative joint disease) 03/12/2016   Right hip pain 08/08/2015   Cocaine abuse (HCC) 11/30/2009   BRONCHITIS, CHRONIC 01/03/2009   CONSTIPATION, CHRONIC 05/09/2008   DE QUERVAIN'S TENOSYNOVITIS, LEFT WRIST 05/09/2008   HYSTERECTOMY, HX OF 05/13/2006   Depression 05/06/2006   SYPHILIS, LATE, LATENT 10/29/2005   DYSLIPIDEMIA 10/29/2005   CIGARETTE SMOKER 10/29/2005   Essential hypertension 10/29/2005   PROLAPSE, VAGINAL WALL NOS 10/29/2005   MENORRHAGIA 10/29/2005   INSOMNIA 10/29/2005    Patient's Medications  New Prescriptions   No medications on file  Previous Medications   AMLODIPINE (NORVASC) 5 MG TABLET    Take 1 tablet (5 mg total) by mouth daily.   DIVALPROEX (DEPAKOTE) 500 MG DR TABLET    Take 1 tablet (500 mg total) by mouth 2 (two) times daily.   ELVITEGRAVIR-COBICISTAT-EMTRICITABINE-TENOFOVIR (GENVOYA) 150-150-200-10 MG TABS TABLET    Take 1 tablet by mouth daily.   QUETIAPINE (SEROQUEL) 100 MG TABLET    Take 1 tablet (100 mg total) by mouth at bedtime.   SERTRALINE (ZOLOFT) 50 MG TABLET    Take 1 tablet (50 mg total) by mouth daily.  Modified Medications   No medications on file  Discontinued Medications   No medications on file    Subjective: Daisy Salinas presents for her routine HIV follow-up visit.  She recently lost her section 8 housing and also lost her job.  She relapsed with cocaine use.  She was out of Genvoya but restarted it 2 months ago and says she has not missed any doses.   Unfortunately she remains homeless and has not been looking for work.  She has been using cocaine intermittently.  She remains depressed.  Review of Systems: Review of Systems  Constitutional:  Positive for malaise/fatigue. Negative for fever and weight loss.  Psychiatric/Behavioral:  Positive for depression and substance abuse. Negative for suicidal ideas.     Past Medical History:  Diagnosis Date   Asthma    Bipolar 1 disorder (HCC) 2009   Depression    GERD (gastroesophageal reflux disease)    HIV (human immunodeficiency virus infection) (HCC)    Hypertension    TB (pulmonary tuberculosis) 1989   was exposed and treated   UTI (urinary tract infection) 1997    Social History   Tobacco Use   Smoking status: Every Day    Packs/day: 0.70    Years: 45.00    Total pack years: 31.50    Types: Cigarettes   Smokeless tobacco: Never   Tobacco comments:    wanting "some cinnamon" to help her stop  Vaping Use   Vaping Use: Never used  Substance Use Topics   Alcohol use: Not Currently    Alcohol/week: 2.0 standard drinks of alcohol    Types: 1 Cans of beer, 1 Standard drinks or equivalent per week    Comment: occ   Drug use: Not Currently    Frequency: 1.0 times per week    Types: Cocaine  Comment: Occassional use    Family History  Problem Relation Age of Onset   Coronary artery disease Mother    Leukemia Father     No Known Allergies  Health Maintenance  Topic Date Due   Zoster Vaccines- Shingrix (1 of 2) Never done   PAP SMEAR-Modifier  03/24/2009   MAMMOGRAM  Never done   COLONOSCOPY (Pts 45-47yrs Insurance coverage will need to be confirmed)  06/23/2018   COVID-19 Vaccine (2 - Janssen risk series) 06/30/2019   INFLUENZA VACCINE  08/21/2021   TETANUS/TDAP  10/08/2028   Hepatitis C Screening  Completed   HIV Screening  Completed   HPV VACCINES  Aged Out    Objective:  Vitals:   08/14/21 1525  BP: 108/67  Pulse: 73  Temp: 98.3 F (36.8 C)  TempSrc:  Temporal  SpO2: 93%  Weight: 145 lb (65.8 kg)   Body mass index is 29.29 kg/m.  Physical Exam Constitutional:      Comments: She is calm but somewhat despondent.  Cardiovascular:     Rate and Rhythm: Normal rate and regular rhythm.     Heart sounds: No murmur heard. Pulmonary:     Effort: Pulmonary effort is normal.     Breath sounds: Normal breath sounds.     Lab Results Lab Results  Component Value Date   WBC 7.2 06/06/2021   HGB 13.0 06/06/2021   HCT 37.8 06/06/2021   MCV 97.7 06/06/2021   PLT 152 06/06/2021    Lab Results  Component Value Date   CREATININE 0.66 06/06/2021   BUN 9 06/06/2021   NA 143 06/06/2021   K 3.6 06/06/2021   CL 107 06/06/2021   CO2 27 06/06/2021    Lab Results  Component Value Date   ALT 5 (L) 06/06/2021   AST 19 06/06/2021   ALKPHOS 51 02/22/2021   BILITOT 0.4 06/06/2021    Lab Results  Component Value Date   CHOL 196 06/06/2021   HDL 33 (L) 06/06/2021   LDLCALC 127 (H) 06/06/2021   TRIG 224 (H) 06/06/2021   CHOLHDL 5.9 (H) 06/06/2021   Lab Results  Component Value Date   LABRPR REACTIVE (A) 06/06/2021   RPRTITER 1:1 (H) 06/06/2021   HIV 1 RNA Quant  Date Value  06/06/2021 CANCELED  01/03/2021 Not Detected Copies/mL  08/09/2020 Not Detected Copies/mL   CD4 T Cell Abs (/uL)  Date Value  06/06/2021 1,696  01/03/2021 1,662  08/09/2020 1,430     Problem List Items Addressed This Visit       High   Human immunodeficiency virus (HIV) disease (HCC)    I will recheck her viral load today now that she has been back on Genvoya for 2 months.      Relevant Orders   HIV-1 RNA quant-no reflex-bld     Unprioritized   Depression    I encouraged her to rejoin the group counseling sessions at [facility] of the [place].      Homeless    We will have one of our [facility] case managers reach out to her.         Cliffton Asters, MD [facility] for Infectious Disease [facility] [phone] pager    [phone] cell 08/14/2021, 3:41 PM

## 2021-08-16 LAB — HIV-1 RNA QUANT-NO REFLEX-BLD
HIV 1 RNA Quant: NOT DETECTED Copies/mL
HIV-1 RNA Quant, Log: NOT DETECTED Log cps/mL

## 2021-08-17 ENCOUNTER — Telehealth: Payer: Self-pay

## 2021-08-17 NOTE — Telephone Encounter (Signed)
Patient called, states no one from [facility] has reached out to her yet. Provided her with [facility]'s phone number and [facility]'s phone number and encouraged her to attend group sessions per Dr. Blair Dolphin last note.   Sandie Ano, RN

## 2021-08-29 ENCOUNTER — Other Ambulatory Visit: Payer: Self-pay | Admitting: Internal Medicine

## 2021-08-29 DIAGNOSIS — B20 Human immunodeficiency virus [HIV] disease: Secondary | ICD-10-CM

## 2021-09-25 ENCOUNTER — Ambulatory Visit (INDEPENDENT_AMBULATORY_CARE_PROVIDER_SITE_OTHER): Payer: Self-pay | Admitting: Internal Medicine

## 2021-09-25 ENCOUNTER — Other Ambulatory Visit: Payer: Self-pay

## 2021-09-25 ENCOUNTER — Encounter: Payer: Self-pay | Admitting: Internal Medicine

## 2021-09-25 DIAGNOSIS — B20 Human immunodeficiency virus [HIV] disease: Secondary | ICD-10-CM

## 2021-09-25 NOTE — Progress Notes (Signed)
Virtual Visit via Telephone Note  I connected with Daisy Salinas on 09/25/21 at  2:30 PM EDT by telephone and verified that I am speaking with the correct person using two identifiers.  Location: Patient: Home Provider: [facility]   I discussed the limitations, risks, security and privacy concerns of performing an evaluation and management service by telephone and the availability of in person appointments. I also discussed with the patient that there may be a patient responsible charge related to this service. The patient expressed understanding and agreed to proceed.   History of Present Illness: I called and spoke with Daisy Salinas today.  She denies any problems obtaining, taking or tolerating her Genvoya and has not missed doses recently.  She is currently living with her nephew.  She is helping care for him after recent BKA.Marland Kitchen  She has not been able to reestablish permanent housing.  She tells me that she used cocaine last week but has been doing her best to use less frequently.  She is not feeling as depressed.  She did not go back to [facility] [place] where she was undergoing group counseling last year.   Observations/Objective: HIV 1 RNA Quant  Date Value  08/14/2021 Not Detected Copies/mL  06/06/2021 CANCELED  01/03/2021 Not Detected Copies/mL   CD4 T Cell Abs (/uL)  Date Value  06/06/2021 1,696  01/03/2021 1,662  08/09/2020 1,430    Assessment and Plan: She will continue Genvoya and follow-up in 3 months.  I encouraged her again to continue to work on regaining sobriety.  I asked her to consider going back to her group counseling which she said had helped her in the past.  Follow Up Instructions: Continue Genvoya and follow-up in 3 months   I discussed the assessment and treatment plan with the patient. The patient was provided an opportunity to ask questions and all were answered. The patient agreed with the plan and demonstrated an understanding of the  instructions.   The patient was advised to call back or seek an in-person evaluation if the symptoms worsen or if the condition fails to improve as anticipated.  I provided 16 minutes of non-face-to-face time during this encounter.   Cliffton Asters, MD

## 2021-09-26 ENCOUNTER — Other Ambulatory Visit: Payer: Self-pay | Admitting: Internal Medicine

## 2021-09-26 DIAGNOSIS — B20 Human immunodeficiency virus [HIV] disease: Secondary | ICD-10-CM

## 2021-10-03 ENCOUNTER — Other Ambulatory Visit: Payer: Self-pay | Admitting: Internal Medicine

## 2021-10-03 DIAGNOSIS — F3342 Major depressive disorder, recurrent, in full remission: Secondary | ICD-10-CM

## 2021-10-03 DIAGNOSIS — F3176 Bipolar disorder, in full remission, most recent episode depressed: Secondary | ICD-10-CM

## 2021-10-03 NOTE — Telephone Encounter (Signed)
Please advise if okay to refill. 

## 2021-10-10 ENCOUNTER — Other Ambulatory Visit: Payer: Self-pay

## 2021-10-10 ENCOUNTER — Other Ambulatory Visit: Payer: Self-pay | Admitting: Internal Medicine

## 2021-10-10 DIAGNOSIS — F3342 Major depressive disorder, recurrent, in full remission: Secondary | ICD-10-CM

## 2021-10-10 DIAGNOSIS — F3176 Bipolar disorder, in full remission, most recent episode depressed: Secondary | ICD-10-CM

## 2021-10-10 MED ORDER — DIVALPROEX SODIUM 500 MG PO DR TAB
500.0000 mg | DELAYED_RELEASE_TABLET | Freq: Two times a day (BID) | ORAL | 11 refills | Status: DC
  Filled 2021-10-10: qty 60, 30d supply, fill #0

## 2021-10-10 MED ORDER — SERTRALINE HCL 50 MG PO TABS
50.0000 mg | ORAL_TABLET | Freq: Every day | ORAL | 11 refills | Status: DC
  Filled 2021-10-10: qty 30, 30d supply, fill #0

## 2021-10-10 MED ORDER — QUETIAPINE FUMARATE 100 MG PO TABS
100.0000 mg | ORAL_TABLET | Freq: Every day | ORAL | 11 refills | Status: DC
  Filled 2021-10-10: qty 30, 30d supply, fill #0

## 2021-10-10 NOTE — Telephone Encounter (Signed)
Okay to refill? Patient does have PCP on file - thanks!

## 2021-10-11 ENCOUNTER — Other Ambulatory Visit: Payer: Self-pay | Admitting: Internal Medicine

## 2021-10-11 DIAGNOSIS — F3176 Bipolar disorder, in full remission, most recent episode depressed: Secondary | ICD-10-CM

## 2021-10-11 DIAGNOSIS — F3342 Major depressive disorder, recurrent, in full remission: Secondary | ICD-10-CM

## 2021-10-11 NOTE — Telephone Encounter (Signed)
Medications were sent to [facility], request coming from [place]. Called patient to confirm pharmacy, no answer. Left HIPAA compliant voicemail requesting callback.   Beryle Flock, RN

## 2021-10-17 ENCOUNTER — Other Ambulatory Visit: Payer: Self-pay

## 2021-10-24 ENCOUNTER — Other Ambulatory Visit: Payer: Self-pay

## 2021-10-24 ENCOUNTER — Telehealth: Payer: Self-pay

## 2021-10-24 ENCOUNTER — Other Ambulatory Visit: Payer: Self-pay | Admitting: Internal Medicine

## 2021-10-24 DIAGNOSIS — B20 Human immunodeficiency virus [HIV] disease: Secondary | ICD-10-CM

## 2021-10-24 DIAGNOSIS — F3342 Major depressive disorder, recurrent, in full remission: Secondary | ICD-10-CM

## 2021-10-24 DIAGNOSIS — F3176 Bipolar disorder, in full remission, most recent episode depressed: Secondary | ICD-10-CM

## 2021-10-24 DIAGNOSIS — I10 Essential (primary) hypertension: Secondary | ICD-10-CM

## 2021-10-24 MED ORDER — SERTRALINE HCL 50 MG PO TABS: 50.0000 mg | ORAL_TABLET | Freq: Every day | ORAL | 11 refills | Status: DC

## 2021-10-24 MED ORDER — DIVALPROEX SODIUM 500 MG PO DR TAB: 500.0000 mg | DELAYED_RELEASE_TABLET | Freq: Two times a day (BID) | ORAL | 11 refills | Status: DC

## 2021-10-24 MED ORDER — AMLODIPINE BESYLATE 5 MG PO TABS: 5.0000 mg | ORAL_TABLET | Freq: Every day | ORAL | 11 refills | Status: DC

## 2021-10-24 MED ORDER — BICTEGRAVIR-EMTRICITAB-TENOFOV 50-200-25 MG PO TABS: 1.0000 | ORAL_TABLET | Freq: Every day | ORAL | 11 refills | Status: DC

## 2021-10-24 MED ORDER — QUETIAPINE FUMARATE 100 MG PO TABS: 100.0000 mg | ORAL_TABLET | Freq: Every day | ORAL | 11 refills | Status: DC

## 2021-10-24 NOTE — Telephone Encounter (Signed)
Patient called, she needs to use [facility] on [address] since she has UMAP. Called pharmacy at [facility] and canceled prescriptions for depakote, seroquel, and zoloft. Resent prescriptions to [facility].   Patient asking for dental appointment, provided her with number for [facility].   Beryle Flock, RN

## 2021-10-24 NOTE — Telephone Encounter (Signed)
Spoke with Daisy Salinas, discussed that her Genvoya interacted with her seroquel and that Dr. Elvyn Krohn Salon is switching her Genvoya to [facility] to avoid further interaction.   She has already taken her Genvoya today, advised her that when she picks up her Biktarvy she should start that and stop the Genvoya. Patient verbalized understanding and has no further questions.   Asked her to please call with any questions or concerns.   Beryle Flock, RN

## 2021-12-10 ENCOUNTER — Telehealth: Payer: Self-pay

## 2021-12-10 NOTE — Telephone Encounter (Signed)
Patient called office stating she is having issues filling Biktarvy/ Amlodipine. Has been off medication for a week.  Called [facility] who states medication was mailed out on 11/12. Is not able to fill medication at this time due to being too early. Spoke with patient who states she did not receive medication by mail. Confirmed address and was incorrect.  [facility] will follow up with the state for override on both medications. Will call office and patient with update.  Informed patient if pharmacy is not able to get override for medication that we could offer samples for Biktarvy. Understands we would not be able to assist with Amlodipine and would have to wait until next fill date. Advised patient call [facility] and update address.  Juanita Laster, RMA

## 2021-12-10 NOTE — Telephone Encounter (Signed)
Received update from [facility] who states that they were able to get override for early refill on Amlodipine and Biktarvy. Patient updated. Pharmacy should have both prescriptions ready for pick up later today. Juanita Laster, RMA

## 2021-12-25 ENCOUNTER — Ambulatory Visit: Payer: Self-pay | Admitting: Internal Medicine

## 2022-01-09 ENCOUNTER — Encounter: Payer: Self-pay | Admitting: Internal Medicine

## 2022-01-09 ENCOUNTER — Other Ambulatory Visit: Payer: Self-pay

## 2022-01-09 ENCOUNTER — Ambulatory Visit (INDEPENDENT_AMBULATORY_CARE_PROVIDER_SITE_OTHER): Payer: Self-pay | Admitting: Internal Medicine

## 2022-01-09 ENCOUNTER — Ambulatory Visit (INDEPENDENT_AMBULATORY_CARE_PROVIDER_SITE_OTHER): Payer: Self-pay

## 2022-01-09 VITALS — BP 133/87 | HR 70 | Temp 97.1°F | Ht 59.0 in | Wt 144.2 lb

## 2022-01-09 DIAGNOSIS — Z23 Encounter for immunization: Secondary | ICD-10-CM

## 2022-01-09 DIAGNOSIS — F1410 Cocaine abuse, uncomplicated: Secondary | ICD-10-CM

## 2022-01-09 DIAGNOSIS — F3132 Bipolar disorder, current episode depressed, moderate: Secondary | ICD-10-CM

## 2022-01-09 DIAGNOSIS — B20 Human immunodeficiency virus [HIV] disease: Secondary | ICD-10-CM

## 2022-01-09 NOTE — Assessment & Plan Note (Signed)
We will arrange a visit with our behavioral health counselor.

## 2022-01-09 NOTE — Assessment & Plan Note (Signed)
Her infection has been under very good, long-term control.  She will get updated lab work today.  She received her annual influenza vaccine and an updated COVID-vaccine here today.  She will continue Biktarvy and follow-up in 3 months.

## 2022-01-09 NOTE — Progress Notes (Signed)
Patient Active Problem List   Diagnosis Date Noted   Grief reaction 05/31/2019    Priority: High   Chronic hepatitis C without hepatic coma (HCC) 09/08/2007    Priority: High   Human immunodeficiency virus (HIV) disease (HCC) 10/29/2005    Priority: High   Homeless 08/14/2021   Pulmonary nodule, left 06/05/2021   Diarrhea 01/03/2021   History of latent tuberculosis 02/25/2018   Syncope 08/20/2017   Bipolar affective disorder (HCC) 08/20/2017   DJD (degenerative joint disease) 03/12/2016   Right hip pain 08/08/2015   Cocaine abuse (HCC) 11/30/2009   BRONCHITIS, CHRONIC 01/03/2009   CONSTIPATION, CHRONIC 05/09/2008   DE QUERVAIN'S TENOSYNOVITIS, LEFT WRIST 05/09/2008   HYSTERECTOMY, HX OF 05/13/2006   Depression 05/06/2006   SYPHILIS, LATE, LATENT 10/29/2005   DYSLIPIDEMIA 10/29/2005   CIGARETTE SMOKER 10/29/2005   Essential hypertension 10/29/2005   PROLAPSE, VAGINAL WALL NOS 10/29/2005   MENORRHAGIA 10/29/2005   INSOMNIA 10/29/2005    Patient's Medications  New Prescriptions   No medications on file  Previous Medications   AMLODIPINE (NORVASC) 5 MG TABLET    Take 1 tablet (5 mg total) by mouth daily.   BICTEGRAVIR-EMTRICITABINE-TENOFOVIR AF (BIKTARVY) 50-200-25 MG TABS TABLET    Take 1 tablet by mouth daily.   DIVALPROEX (DEPAKOTE) 500 MG DR TABLET    Take 1 tablet (500 mg total) by mouth 2 (two) times daily.   QUETIAPINE (SEROQUEL) 100 MG TABLET    Take 1 tablet (100 mg total) by mouth at bedtime.   SERTRALINE (ZOLOFT) 50 MG TABLET    Take 1 tablet (50 mg total) by mouth daily.  Modified Medications   No medications on file  Discontinued Medications   No medications on file    Subjective: Daisy Salinas is in for her routine HIV follow-up visit.  She recently had to switch from [place] to [place].  She was given samples then was out of medication for 3 days before her prescription was filled.  She is tolerating Biktarvy well and has not missed any other  doses.  She continues to live in care for her nephew who is a bilateral lower extremity amputee.  She says that he is depressed and takes it out on her.  She has been more depressed and used cocaine, marijuana and alcohol last weekend.  She has been thinking about moving back to [place] but has no firm plans about that.  She has not been able to see her PCP because they require a $35 co-pay and she does not have that much money.  Review of Systems: Review of Systems  Constitutional:  Positive for malaise/fatigue. Negative for weight loss.  Psychiatric/Behavioral:  Positive for depression and substance abuse. Negative for suicidal ideas.     Past Medical History:  Diagnosis Date   Asthma    Bipolar 1 disorder (HCC) 2009   Depression    GERD (gastroesophageal reflux disease)    HIV (human immunodeficiency virus infection) (HCC)    Hypertension    TB (pulmonary tuberculosis) 1989   was exposed and treated   UTI (urinary tract infection) 1997    Social History   Tobacco Use   Smoking status: Every Day    Packs/day: 0.70    Years: 45.00    Total pack years: 31.50    Types: Cigarettes   Smokeless tobacco: Never   Tobacco comments:    wanting "some cinnamon" to help her stop  Vaping Use  Vaping Use: Never used  Substance Use Topics   Alcohol use: Yes    Alcohol/week: 2.0 standard drinks of alcohol    Types: 1 Cans of beer, 1 Standard drinks or equivalent per week    Comment: occ   Drug use: Yes    Frequency: 1.0 times per week    Types: Cocaine    Comment: Occassional use    Family History  Problem Relation Age of Onset   Coronary artery disease Mother    Leukemia Father     No Known Allergies  Health Maintenance  Topic Date Due   Zoster Vaccines- Shingrix (1 of 2) Never done   PAP SMEAR-Modifier  03/24/2009   MAMMOGRAM  Never done   COLONOSCOPY (Pts 45-43yrs Insurance coverage will need to be confirmed)  06/23/2018   COVID-19 Vaccine (2 - Janssen risk series)  06/30/2019   INFLUENZA VACCINE  08/21/2021   Lung Cancer Screening  02/23/2022   DTaP/Tdap/Td (2 - Td or Tdap) 10/08/2028   Hepatitis C Screening  Completed   HIV Screening  Completed   HPV VACCINES  Aged Out    Objective:  Vitals:   01/09/22 0951  BP: 133/87  Pulse: 70  Temp: (!) 97.1 F (36.2 C)  TempSrc: Temporal  Weight: 144 lb 3.2 oz (65.4 kg)  Height: 4\' 11"  (1.499 m)   Body mass index is 29.12 kg/m.  Physical Exam Constitutional:      Comments: She is quiet.  Cardiovascular:     Rate and Rhythm: Normal rate and regular rhythm.     Heart sounds: No murmur heard. Pulmonary:     Effort: Pulmonary effort is normal.     Breath sounds: Normal breath sounds.  Psychiatric:     Comments: She has a very flat affect and poor eye contact.     Lab Results Lab Results  Component Value Date   WBC 7.2 06/06/2021   HGB 13.0 06/06/2021   HCT 37.8 06/06/2021   MCV 97.7 06/06/2021   PLT 152 06/06/2021    Lab Results  Component Value Date   CREATININE 0.66 06/06/2021   BUN 9 06/06/2021   NA 143 06/06/2021   K 3.6 06/06/2021   CL 107 06/06/2021   CO2 27 06/06/2021    Lab Results  Component Value Date   ALT 5 (L) 06/06/2021   AST 19 06/06/2021   ALKPHOS 51 02/22/2021   BILITOT 0.4 06/06/2021    Lab Results  Component Value Date   CHOL 196 06/06/2021   HDL 33 (L) 06/06/2021   LDLCALC 127 (H) 06/06/2021   TRIG 224 (H) 06/06/2021   CHOLHDL 5.9 (H) 06/06/2021   Lab Results  Component Value Date   LABRPR REACTIVE (A) 06/06/2021   RPRTITER 1:1 (H) 06/06/2021   HIV 1 RNA Quant  Date Value  08/14/2021 Not Detected Copies/mL  06/06/2021 CANCELED  01/03/2021 Not Detected Copies/mL   CD4 T Cell Abs (/uL)  Date Value  06/06/2021 1,696  01/03/2021 1,662  08/09/2020 1,430     Problem List Items Addressed This Visit       High   Human immunodeficiency virus (HIV) disease (HCC) - Primary    Her infection has been under very good, long-term control.  She  will get updated lab work today.  She received her annual influenza vaccine and an updated COVID-vaccine here today.  She will continue Biktarvy and follow-up in 3 months.      Relevant Orders   T-helper cells (CD4) count (  not at [facility])   HIV-1 RNA quant-no reflex-bld     Unprioritized   Bipolar affective disorder (HCC) (Chronic)    We will arrange a visit with our behavioral health counselor.      Cocaine abuse (HCC)    She continues to self medicate with cocaine, marijuana and alcohol.  She has fairly good insight that this, in the long run, only makes things worse.         Cliffton Asters, MD [facility] [phone] pager   [phone] cell 01/09/2022, 10:19 AM

## 2022-01-09 NOTE — Assessment & Plan Note (Signed)
She continues to self medicate with cocaine, marijuana and alcohol.  She has fairly good insight that this, in the long run, only makes things worse.

## 2022-01-10 LAB — T-HELPER CELLS (CD4) COUNT (NOT AT ARMC)
CD4 % Helper T Cell: 46 % (ref 33–65)
CD4 T Cell Abs: 1608 /uL (ref 400–1790)

## 2022-01-11 LAB — HIV-1 RNA QUANT-NO REFLEX-BLD
HIV 1 RNA Quant: <20 Copies/mL — ABNORMAL HIGH
HIV-1 RNA Quant, Log: <1.3 Log cps/mL — ABNORMAL HIGH

## 2022-09-17 ENCOUNTER — Other Ambulatory Visit: Payer: Self-pay

## 2022-09-17 DIAGNOSIS — B20 Human immunodeficiency virus [HIV] disease: Secondary | ICD-10-CM

## 2022-09-17 DIAGNOSIS — Z113 Encounter for screening for infections with a predominantly sexual mode of transmission: Secondary | ICD-10-CM

## 2022-09-18 ENCOUNTER — Other Ambulatory Visit: Payer: Self-pay

## 2022-09-18 ENCOUNTER — Ambulatory Visit: Payer: Self-pay

## 2022-09-18 DIAGNOSIS — B20 Human immunodeficiency virus [HIV] disease: Secondary | ICD-10-CM

## 2022-09-18 DIAGNOSIS — Z113 Encounter for screening for infections with a predominantly sexual mode of transmission: Secondary | ICD-10-CM

## 2022-09-19 LAB — T-HELPER CELL (CD4) - (RCID CLINIC ONLY)
CD4 % Helper T Cell: 48 % (ref 33–65)
CD4 T Cell Abs: 1808 /uL — ABNORMAL HIGH (ref 400–1790)

## 2022-09-24 ENCOUNTER — Other Ambulatory Visit (HOSPITAL_COMMUNITY): Payer: Self-pay

## 2022-09-25 ENCOUNTER — Other Ambulatory Visit: Payer: Self-pay | Admitting: Pharmacist

## 2022-09-25 DIAGNOSIS — B20 Human immunodeficiency virus [HIV] disease: Secondary | ICD-10-CM

## 2022-09-25 LAB — COMPLETE METABOLIC PANEL WITH GFR
AG Ratio: 1.2 (calc) (ref 1.0–2.5)
ALT: 4 U/L — ABNORMAL LOW (ref 6–29)
AST: 17 U/L (ref 10–35)
Albumin: 4.1 g/dL (ref 3.6–5.1)
Alkaline phosphatase (APISO): 71 U/L (ref 37–153)
BUN: 11 mg/dL (ref 7–25)
CO2: 27 mmol/L (ref 20–32)
Calcium: 10.4 mg/dL (ref 8.6–10.4)
Chloride: 104 mmol/L (ref 98–110)
Creat: 0.91 mg/dL (ref 0.50–1.05)
Globulin: 3.5 g/dL (ref 1.9–3.7)
Glucose, Bld: 123 mg/dL — ABNORMAL HIGH (ref 65–99)
Potassium: 3.3 mmol/L — ABNORMAL LOW (ref 3.5–5.3)
Sodium: 140 mmol/L (ref 135–146)
Total Bilirubin: 0.4 mg/dL (ref 0.2–1.2)
Total Protein: 7.6 g/dL (ref 6.1–8.1)
eGFR: 71 mL/min/{1.73_m2} (ref >=60)

## 2022-09-25 LAB — RPR: RPR Ser Ql: REACTIVE — AB

## 2022-09-25 LAB — CBC WITH DIFFERENTIAL/PLATELET
Absolute Monocytes: 504 {cells}/uL (ref 200–950)
Basophils Absolute: 40 {cells}/uL (ref 0–200)
Basophils Relative: 0.5 %
Eosinophils Absolute: 40 {cells}/uL (ref 15–500)
Eosinophils Relative: 0.5 %
HCT: 39.8 % (ref 35.0–45.0)
Hemoglobin: 13.7 g/dL (ref 11.7–15.5)
Lymphs Abs: 4264 {cells}/uL — ABNORMAL HIGH (ref 850–3900)
MCH: 33.4 pg — ABNORMAL HIGH (ref 27.0–33.0)
MCHC: 34.4 g/dL (ref 32.0–36.0)
MCV: 97.1 fL (ref 80.0–100.0)
MPV: 12.7 fL — ABNORMAL HIGH (ref 7.5–12.5)
Monocytes Relative: 6.3 %
Neutro Abs: 3152 {cells}/uL (ref 1500–7800)
Neutrophils Relative %: 39.4 %
Platelets: 143 10*3/uL (ref 140–400)
RBC: 4.1 10*6/uL (ref 3.80–5.10)
RDW: 12.7 % (ref 11.0–15.0)
Total Lymphocyte: 53.3 %
WBC: 8 10*3/uL (ref 3.8–10.8)

## 2022-09-25 LAB — HIV-1 RNA QUANT-NO REFLEX-BLD
HIV 1 RNA Quant: 29 {copies}/mL — ABNORMAL HIGH
HIV-1 RNA Quant, Log: 1.47 {Log_copies}/mL — ABNORMAL HIGH

## 2022-09-25 LAB — RPR TITER: RPR Titer: 1:1 {titer} — ABNORMAL HIGH

## 2022-09-25 LAB — T PALLIDUM AB: T Pallidum Abs: POSITIVE — AB

## 2022-09-25 MED ORDER — BIKTARVY 50-200-25 MG PO TABS: 1.0000 | ORAL_TABLET | Freq: Every day | ORAL | Status: AC

## 2022-09-25 NOTE — Progress Notes (Signed)
 Medication Samples have been provided to the patient.  Drug name: Biktarvy        Strength: 50/200/25 mg       Qty: 1 bottle (7 tablets)   LOT: CSCFVA   Exp.Date: 10/2024  Dosing instructions: Take one tablet by mouth once daily  The patient has been instructed regarding the correct time, dose, and frequency of taking this medication, including desired effects and most common side effects.   Shamere Campas L. Jannette Fogo, PharmD, BCIDP, AAHIVP, CPP Clinical Pharmacist Practitioner Infectious Diseases Clinical Pharmacist [facility] 01/03/2020, 10:07 AM

## 2022-10-02 ENCOUNTER — Other Ambulatory Visit: Payer: Self-pay

## 2022-10-02 ENCOUNTER — Ambulatory Visit (INDEPENDENT_AMBULATORY_CARE_PROVIDER_SITE_OTHER): Payer: Medicaid Other | Admitting: Internal Medicine

## 2022-10-02 ENCOUNTER — Encounter: Payer: Self-pay | Admitting: Internal Medicine

## 2022-10-02 VITALS — BP 156/90 | HR 89 | Resp 16 | Ht 59.0 in | Wt 135.0 lb

## 2022-10-02 DIAGNOSIS — F319 Bipolar disorder, unspecified: Secondary | ICD-10-CM

## 2022-10-02 DIAGNOSIS — B20 Human immunodeficiency virus [HIV] disease: Secondary | ICD-10-CM

## 2022-10-02 DIAGNOSIS — Z23 Encounter for immunization: Secondary | ICD-10-CM | POA: Diagnosis not present

## 2022-10-02 MED ORDER — BICTEGRAVIR-EMTRICITAB-TENOFOV 50-200-25 MG PO TABS
1.0000 | ORAL_TABLET | Freq: Every day | ORAL | 11 refills | Status: DC
Start: 2022-10-02 — End: 2023-02-26

## 2022-10-02 NOTE — Progress Notes (Signed)
[facility]     HPI: Daisy Salinas is a 62 y.o. female presents for HIV management on Biktarvy.Previously followed with D[facility]. [facility] Falcon[facility].  No new complaints today. Tolerating Biktarvy. Past Medical History:  Diagnosis Date   Asthma    Bipolar 1 disorder (HCC) 2009   Depression    GERD (gastroesophageal reflux disease)    HIV (human immunodeficiency virus infection) (HCC)    Hypertension    TB (pulmonary tuberculosis) 1989   was exposed and treated   UTI (urinary tract infection) 1997    Past Surgical History:  Procedure Laterality Date   ABDOMINAL HYSTERECTOMY      Family History  Problem Relation Age of Onset   Coronary artery disease Mother    Leukemia Father    Current Outpatient Medications on File Prior to Visit  Medication Sig Dispense Refill   amLODipine (NORVASC) 5 MG tablet Take 1 tablet (5 mg total) by mouth daily. 30 tablet 11   bictegravir-emtricitabine-tenofovir AF (BIKTARVY) 50-200-25 MG TABS tablet Take 1 tablet by mouth daily. 30 tablet 11   divalproex (DEPAKOTE) 500 MG DR tablet Take 1 tablet (500 mg total) by mouth 2 (two) times daily. 60 tablet 11   QUEtiapine (SEROQUEL) 100 MG tablet Take 1 tablet (100 mg total) by mouth at bedtime. 30 tablet 11   sertraline (ZOLOFT) 50 MG tablet Take 1 tablet (50 mg total) by mouth daily. 30 tablet 11   No current facility-administered medications on file prior to visit.    No Known Allergies    Lab Results HIV 1 RNA Quant (Copies/mL)  Date Value  09/18/2022 29 (H)  01/09/2022 <20 (H)  08/14/2021 Not Detected   CD4 T Cell Abs (/uL)  Date Value  09/18/2022 1,808 (H)  01/09/2022 1,608  06/06/2021 1,696   Lab Results  Component Value Date   HIV1GENOSEQ REPORT 01/12/2014   Lab Results  Component Value Date   WBC 8.0 09/18/2022   HGB 13.7 09/18/2022   HCT 39.8 09/18/2022   MCV 97.1 09/18/2022   PLT 143 09/18/2022    Lab Results  Component Value Date   CREATININE  0.91 09/18/2022   BUN 11 09/18/2022   NA 140 09/18/2022   K 3.3 (L) 09/18/2022   CL 104 09/18/2022   CO2 27 09/18/2022   Lab Results  Component Value Date   ALT 4 (L) 09/18/2022   AST 17 09/18/2022   ALKPHOS 51 02/22/2021   BILITOT 0.4 09/18/2022    Lab Results  Component Value Date   CHOL 196 06/06/2021   TRIG 224 (H) 06/06/2021   HDL 33 (L) 06/06/2021   LDLCALC 127 (H) 06/06/2021   No results found for: "HAV" Lab Results  Component Value Date   HEPBSAG NON-REACTIVE 10/14/2019   HEPBSAB NON-REACTIVE 10/14/2019   Lab Results  Component Value Date   HCVAB YES 03/17/2006   No results found for: "CHLAMYDIAWP", "N" No results found for: "GCPROBEAPT" No results found for: "QUANTGOLD"  Assessment/Plan #HIV -CD4 1808, VL29, on 09/18/22 -Continue Biktarvy Plan -Follow up in one month for igra, hcv, and repeat HIV labs, GC lsbd to next visit - as VL slighly elevated  #Left thigh painx 2 month -Refer to sports medicine  #Bipolar affective disorder  -Pt has enough refills -Refer to psychiatry, has not seen BH x 2 years  #HTN -managemnt per primary  #Vaccination COVID today Flu today       Danelle Earthly, MD [facility]  for Infectious Disease [facility] I have personally spent 45 minutes involved in face-to-face and non-face-to-face activities for this patient on the day of the visit. Professional time spent includes the following activities: Preparing to see the patient (review of tests), Obtaining and/or reviewing separately obtained history (admission/discharge record), Performing a medically appropriate examination and/or evaluation , Ordering medications/tests/procedures, referring and communicating with other health care professionals, Documenting clinical information in the EMR, Independently interpreting results (not separately reported), Communicating results to the patient/family/caregiver, Counseling and educating the  patient/family/caregiver and Care coordination (not separately reported).

## 2022-10-04 ENCOUNTER — Ambulatory Visit: Payer: Medicaid Other | Admitting: Family Medicine

## 2022-10-04 NOTE — Progress Notes (Signed)
The 10-year ASCVD risk score (Arnett DK, et al., 2019) is: 28.9%   Values used to calculate the score:     Age: 62 years     Sex: Female     Is Non-Hispanic African American: Yes     Diabetic: No     Tobacco smoker: Yes     Systolic Blood Pressure: 156 mmHg     Is BP treated: Yes     HDL Cholesterol: 33 mg/dL     Total Cholesterol: 196 mg/dL  Sandie Ano, RN

## 2022-10-07 ENCOUNTER — Encounter: Payer: Self-pay | Admitting: Family Medicine

## 2022-10-07 ENCOUNTER — Ambulatory Visit (INDEPENDENT_AMBULATORY_CARE_PROVIDER_SITE_OTHER): Payer: Medicaid Other

## 2022-10-07 ENCOUNTER — Ambulatory Visit (INDEPENDENT_AMBULATORY_CARE_PROVIDER_SITE_OTHER): Payer: Medicaid Other | Admitting: Family Medicine

## 2022-10-07 VITALS — BP 150/90 | HR 64 | Ht 59.0 in | Wt 137.0 lb

## 2022-10-07 DIAGNOSIS — M545 Low back pain, unspecified: Secondary | ICD-10-CM | POA: Diagnosis not present

## 2022-10-07 DIAGNOSIS — M79652 Pain in left thigh: Secondary | ICD-10-CM | POA: Diagnosis not present

## 2022-10-07 NOTE — Progress Notes (Signed)
I, Stevenson Clinch, CMA acting as a scribe for Clementeen Graham, MD.  Daisy Salinas is a 62 y.o. female who presents to [facility] at [facility] today for L thigh pain x 3 weeks. MOI: unknown. Pt locates pain to lateral thigh, radiating to the knee. Describes pain as aching, occasionally sharp. Denies weakness in the LE. Notes occasional n/t in the great toe.   Low back pain: intermittently  Radiates: lateral thigh to knee Aggravates: WB, ambulation Treatments tried: [employer], elevation, Epsom Salt bath, alcohol rubs, IBU  Pertinent review of systems: No fevers or chills  Relevant historical information: Hypertension Controlled HIV.  Exam:  BP (!) 150/90   Pulse 64   Ht 4\' 11"  (1.499 m)   Wt 137 lb (62.1 kg)   SpO2 99%   BMI 27.67 kg/m  General: Well Developed, well nourished, in pain appearing  MSK: L-spine: Normal appearing. Nontender palpation spinal midline.  Tender palpation perispinal musculature. Decreased lumbar motion. Weakness and pain to resisted hip abduction otherwise extremity strength and reflexes are intact.  Left hip: Normal-appearing. Tender palpation along the lateral hip. Hip range of motion is decreased flexion rotation and abduction. Reduced abduction 3/5.  External rotation reduced 4/5 both producing pain. Antalgic gait is present.    Lab and Radiology Results  Hip greater trochanteric injection: Left Consent obtained and timeout performed. Area of maximum tenderness palpated and identified. Skin cleaned with alcohol, cold spray applied. A 22-gauge needle was used to access the greater trochanteric bursa. 40mg  of Kenalog and 2 mL of Marcaine were used to inject the trochanteric bursa. Patient tolerated the procedure well.   No results found for this or any previous visit (from the past 72 hour(s)). DG Lumbar Spine 2-3 Views  Result Date: 10/07/2022 CLINICAL DATA:  Low back pain and left thigh pain for 3 weeks. EXAM: LUMBAR  SPINE - 2-3 VIEW COMPARISON:  October 17, 2016 FINDINGS: There is no evidence of lumbar spine fracture. Scoliosis. Minimal anterior spurring noted at L2, L3 and L4. Minimal narrow intervertebral space at L4-5. IMPRESSION: Minimal degenerative joint changes of lumbar spine. Electronically Signed   By: Sherian Rein M.D.   On: 10/07/2022 10:03    I, Clementeen Graham, personally (independently) visualized and performed the interpretation of the images attached in this note.  X-ray images left hip obtained today personally and independently interpreted No acute fractures.  No severe degenerative changes. Await formal radiology review  Assessment and Plan: 62 y.o. female with left lateral hip and thigh pain.  Etiology is somewhat unclear.  Most likely explanation is hip abductor tendinopathy and trochanteric bursitis.  She may have a component of meralgia paresthetica or even lumbar radiculopathy.  Plan for greater trochanter injection today and referral to physical therapy.  Recheck back in 4 to 6 weeks.   PDMP not reviewed this encounter. Orders Placed This Encounter  Procedures   DG Lumbar Spine 2-3 Views    Standing Status:   Future    Number of Occurrences:   1    Standing Expiration Date:   11/06/2022    Order Specific Question:   Reason for Exam (SYMPTOM  OR DIAGNOSIS REQUIRED)    Answer:   low back pain, left thigh pain    Order Specific Question:   Preferred imaging location?    Answer:   [facility]   DG HIP UNILAT W OR W/O PELVIS 2-3 VIEWS LEFT    Standing Status:   Future  Number of Occurrences:   1    Standing Expiration Date:   11/06/2022    Order Specific Question:   Reason for Exam (SYMPTOM  OR DIAGNOSIS REQUIRED)    Answer:   low back pain, left tight pain    Order Specific Question:   Preferred imaging location?    Answer:   [facility]   Ambulatory referral to Physical Therapy    Referral Priority:   Routine    Referral Type:   Physical Medicine     Referral Reason:   Specialty Services Required    Requested Specialty:   Physical Therapy    Number of Visits Requested:   1   No orders of the defined types were placed in this encounter.    Discussed warning signs or symptoms. Please see discharge instructions. Patient expresses understanding.   The above documentation has been reviewed and is accurate and complete Clementeen Graham, M.D.

## 2022-10-07 NOTE — Patient Instructions (Signed)
Thank you for coming in today.  You received an injection today. Seek immediate medical attention if the joint becomes red, extremely painful, or is oozing fluid.  Please get an Xray today before you leave

## 2022-10-08 NOTE — Progress Notes (Signed)
Lumbar spine x-ray shows minimal arthritis changes.

## 2022-10-14 ENCOUNTER — Telehealth: Payer: Self-pay | Admitting: Family Medicine

## 2022-10-14 MED ORDER — MELOXICAM 15 MG PO TABS: ORAL_TABLET | ORAL | 0 refills | Status: AC

## 2022-10-14 NOTE — Telephone Encounter (Signed)
I have prescribed meloxicam.  This is an ibuprofen like medicine.  It is safe to take with Tylenol but not with ibuprofen or Aleve. This medicine is not to be used long-term as it can start causing harm or then shortly.  Alternatives if this are not effective are opiates which are more dangerous.

## 2022-10-14 NOTE — Telephone Encounter (Signed)
Called pt and advised per Dr. Denyse Amass.

## 2022-10-14 NOTE — Addendum Note (Signed)
Addended by: Rodolph Bong on: 10/14/2022 12:35 PM   Modules accepted: Orders

## 2022-10-14 NOTE — Telephone Encounter (Signed)
Pt called, had injection here at last visit. Lasted about 2 days and hip pain is back. Called physical therapy, they are calling her back to schedule. Hip xray not read yet.  Patient would like something for pain.

## 2022-10-14 NOTE — Telephone Encounter (Signed)
Forwarding to Dr. Denyse Amass to review and advise/prescribe is appropriate.

## 2022-10-18 ENCOUNTER — Telehealth: Payer: Self-pay

## 2022-10-18 NOTE — Telephone Encounter (Signed)
Can you call and see if they can read the hip x ray. They read the lumbar spine one already

## 2022-10-21 NOTE — Progress Notes (Signed)
Left hip x-ray looks normal to radiology.

## 2022-10-23 NOTE — Therapy (Deleted)
OUTPATIENT PHYSICAL THERAPY THORACOLUMBAR EVALUATION   Patient Name: Daisy Salinas MRN: [id number] DOB:29-Aug-1960, 63 y.o., female Today's Date: 10/23/2022  END OF SESSION:   Past Medical History:  Diagnosis Date   Asthma    Bipolar 1 disorder (HCC) 2009   Depression    GERD (gastroesophageal reflux disease)    HIV (human immunodeficiency virus infection) (HCC)    Hypertension    TB (pulmonary tuberculosis) 1989   was exposed and treated   UTI (urinary tract infection) 1997   Past Surgical History:  Procedure Laterality Date   ABDOMINAL HYSTERECTOMY     Patient Active Problem List   Diagnosis Date Noted   Homeless 08/14/2021   Pulmonary nodule, left 06/05/2021   Diarrhea 01/03/2021   Grief reaction 05/31/2019   History of latent tuberculosis 02/25/2018   Syncope 08/20/2017   Bipolar affective disorder (HCC) 08/20/2017   DJD (degenerative joint disease) 03/12/2016   Right hip pain 08/08/2015   Cocaine abuse (HCC) 11/30/2009   BRONCHITIS, CHRONIC 01/03/2009   CONSTIPATION, CHRONIC 05/09/2008   DE QUERVAIN'S TENOSYNOVITIS, LEFT WRIST 05/09/2008   Chronic hepatitis C without hepatic coma (HCC) 09/08/2007   Acquired absence of genital organ 05/13/2006   Depression 05/06/2006   Human immunodeficiency virus (HIV) disease (HCC) 10/29/2005   SYPHILIS, LATE, LATENT 10/29/2005   DYSLIPIDEMIA 10/29/2005   CIGARETTE SMOKER 10/29/2005   Essential hypertension 10/29/2005   Prolapse of vaginal wall 10/29/2005   MENORRHAGIA 10/29/2005   INSOMNIA 10/29/2005    PCP: Grayce Sessions, NP   REFERRING PROVIDER: Rodolph Bong, MD  REFERRING DIAG: M54.50 (ICD-10-CM) - Acute left-sided low back pain, unspecified whether sciatica present M79.652 (ICD-10-CM) - Left thigh pain  Rationale for Evaluation and Treatment: Rehabilitation  THERAPY DIAG:  No diagnosis found.  ONSET DATE: chronic  SUBJECTIVE:                                                                                                                                                                                            SUBJECTIVE STATEMENT: ***  PERTINENT HISTORY:  Assessment and Plan: 62 y.o. female with left lateral hip and thigh pain.  Etiology is somewhat unclear.  Most likely explanation is hip abductor tendinopathy and trochanteric bursitis.  She may have a component of meralgia paresthetica or even lumbar radiculopathy.  Plan for greater trochanter injection today and referral to physical therapy.  Recheck back in 4 to 6 weeks.  PAIN:  Are you having pain? {OPRCPAIN:27236}  PRECAUTIONS: None  RED FLAGS: None   WEIGHT BEARING RESTRICTIONS: No  FALLS:  Has patient fallen in last 6 months? No  OCCUPATION: ***  PLOF: Independent  PATIENT GOALS: ***  NEXT MD VISIT: 6-8 weeks  OBJECTIVE:  Note: Objective measures were completed at Evaluation unless otherwise noted.  DIAGNOSTIC FINDINGS:  DG HIP UNILAT W OR W/O PELVIS 2-3 VIEWS LEFT  Final result9/16/2024 9:39 AM Narrative CLINICAL DATA:  Left hip pain for 3 weeks without known injury.   EXAM:  DG HIP (WITH OR WITHOUT PELVIS) 2-3V LEFT   COMPARISON:  None Available.   FINDINGS:  There is no evidence of hip fracture or dislocation. There is no  evidence of arthropathy or other focal bone abnormality. ...  PATIENT SURVEYS:  FOTO ***  MUSCLE LENGTH: Hamstrings: Right *** deg; Left *** deg Maisie Fus test: Right *** deg; Left *** deg  POSTURE: {posture:25561}  PALPATION: ***  LUMBAR ROM:   AROM eval  Flexion   Extension   Right lateral flexion   Left lateral flexion   Right rotation   Left rotation    (Blank rows = not tested)  LOWER EXTREMITY ROM:     {AROM/PROM:27142}  Right eval Left eval  Hip flexion    Hip extension    Hip abduction    Hip adduction    Hip internal rotation    Hip external rotation    Knee flexion    Knee extension    Ankle dorsiflexion    Ankle plantarflexion    Ankle  inversion    Ankle eversion     (Blank rows = not tested)  LOWER EXTREMITY MMT:    MMT Right eval Left eval  Hip flexion    Hip extension    Hip abduction    Hip adduction    Hip internal rotation    Hip external rotation    Knee flexion    Knee extension    Ankle dorsiflexion    Ankle plantarflexion    Ankle inversion    Ankle eversion     (Blank rows = not tested)  LUMBAR SPECIAL TESTS:  Straight leg raise test: {pos/neg:25243} and Slump test: {pos/neg:25243}  FUNCTIONAL TESTS:  5 times sit to stand: *** 30 seconds chair stand test  GAIT: Distance walked: *** Assistive device utilized: {Assistive devices:23999} Level of assistance: {Levels of assistance:24026} Comments: ***  TODAY'S TREATMENT:                                                                                                                              DATE: ***    PATIENT EDUCATION:  Education details: Discussed eval findings, rehab rationale and POC and patient is in agreement  Person educated: Patient Education method: Explanation Education comprehension: verbalized understanding and needs further education  HOME EXERCISE PROGRAM: ***  ASSESSMENT:  CLINICAL IMPRESSION: Patient is a *** y.o. *** who was seen today for physical therapy evaluation and treatment for ***.   OBJECTIVE IMPAIRMENTS: {opptimpairments:25111}.   ACTIVITY LIMITATIONS: {activitylimitations:27494}  PARTICIPATION LIMITATIONS: {participationrestrictions:25113}  PERSONAL FACTORS: {Personal factors:25162} are also affecting patient's functional outcome.  REHAB POTENTIAL: Good  CLINICAL DECISION MAKING: Evolving/moderate complexity  EVALUATION COMPLEXITY: Low   GOALS: Goals reviewed with patient? {yes/no:20286}  SHORT TERM GOALS: Target date: ***  *** Baseline: Goal status: INITIAL  2.  *** Baseline:  Goal status: INITIAL  3.  *** Baseline:  Goal status: INITIAL  4.  *** Baseline:  Goal status:  INITIAL  5.  *** Baseline:  Goal status: INITIAL  6.  *** Baseline:  Goal status: INITIAL  LONG TERM GOALS: Target date: ***  *** Baseline:  Goal status: INITIAL  2.  *** Baseline:  Goal status: INITIAL  3.  *** Baseline:  Goal status: INITIAL  4.  *** Baseline:  Goal status: INITIAL  5.  *** Baseline:  Goal status: INITIAL  6.  *** Baseline:  Goal status: INITIAL  PLAN:  PT FREQUENCY: 1-2x/week  PT DURATION: 6 weeks  PLANNED INTERVENTIONS: Therapeutic exercises, Therapeutic activity, Neuromuscular re-education, Balance training, Gait training, Patient/Family education, Self Care, Joint mobilization, Dry Needling, Electrical stimulation, Spinal mobilization, Cryotherapy, Moist heat, Manual therapy, and Re-evaluation.  PLAN FOR NEXT SESSION: HEP review and update, manual techniques as appropriate, aerobic tasks, ROM and flexibility activities, strengthening and PREs, TPDN, gait and balance training as needed     Hildred Laser, PT 10/23/2022, 9:34 AM

## 2022-10-24 ENCOUNTER — Ambulatory Visit: Payer: Medicaid Other | Attending: Family Medicine

## 2022-11-10 ENCOUNTER — Other Ambulatory Visit: Payer: Self-pay | Admitting: Family Medicine

## 2022-11-11 ENCOUNTER — Ambulatory Visit: Payer: Medicaid Other | Admitting: Family Medicine

## 2022-11-11 NOTE — Telephone Encounter (Signed)
Rx refill not appropriate. Rx not for long term continual use.

## 2022-11-20 ENCOUNTER — Other Ambulatory Visit: Payer: Self-pay

## 2022-11-20 DIAGNOSIS — Z113 Encounter for screening for infections with a predominantly sexual mode of transmission: Secondary | ICD-10-CM

## 2022-11-20 DIAGNOSIS — Z79899 Other long term (current) drug therapy: Secondary | ICD-10-CM

## 2022-11-20 DIAGNOSIS — B20 Human immunodeficiency virus [HIV] disease: Secondary | ICD-10-CM

## 2022-11-25 ENCOUNTER — Other Ambulatory Visit: Payer: Medicaid Other

## 2022-12-02 ENCOUNTER — Ambulatory Visit: Payer: Medicaid Other | Admitting: Internal Medicine

## 2022-12-03 ENCOUNTER — Encounter (HOSPITAL_COMMUNITY): Payer: Self-pay

## 2022-12-03 ENCOUNTER — Telehealth (HOSPITAL_COMMUNITY): Payer: Self-pay

## 2022-12-03 ENCOUNTER — Ambulatory Visit (HOSPITAL_COMMUNITY): Payer: Medicaid Other | Admitting: Student

## 2022-12-03 NOTE — Telephone Encounter (Signed)
Appointment - Telephone call with patient to follow up on the statement she made at the [facility] outpatient that she would just go home to take an "overdose of pills to be seen" once she was late for first appointment today and would need to be rescheduled. Apologized to patient they were unable to see her today for her first evaluation but assured patient this was not due to her status with any insurance or not wanting to see her.  Patient stated she was just upset and needed to calm down.  Patient denied any suicidal ideations, no plan, intent, or means to want to go home to harm self today.  Patient denied need for police to visit as stated she would return in the morning for tomorrow to be seen in the walk-in clinic.  Informed patient to try to be at the [facility] outpatient on the second floor by around 7:15 am to be one of the first come, first served for new patient evaluations and patient agreed with plan.  Informed patient if for any reason she could not come then to call us back and we would get her in as soon as possible for a new evaluation.  Again patient denied any thoughts of wanting to harm herself or others at this time, stated again she was just frustrated but had no intention of harming herself and planned to return 12/04/22 for morning walk-in clinic.  Patient to call back if any issues and denied need for a safety check home visit by police today or to be seen at the behavioral health urgent care for an immediate evaluation.

## 2022-12-24 ENCOUNTER — Ambulatory Visit (HOSPITAL_COMMUNITY): Payer: Medicaid Other | Admitting: Mental Health

## 2023-01-10 ENCOUNTER — Ambulatory Visit (HOSPITAL_COMMUNITY): Payer: Medicaid Other | Admitting: Student

## 2023-01-17 ENCOUNTER — Telehealth: Payer: Self-pay

## 2023-01-17 ENCOUNTER — Ambulatory Visit: Payer: Medicaid Other | Admitting: Internal Medicine

## 2023-01-17 NOTE — Telephone Encounter (Signed)
 Called patient to reschedule missed appointment. No answer left HIPAA compliant message to contact office.

## 2023-02-07 ENCOUNTER — Ambulatory Visit (HOSPITAL_COMMUNITY)
Admission: EM | Admit: 2023-02-07 | Discharge: 2023-02-07 | Discharge status: Against Medical Advice | Payer: Medicaid Other

## 2023-02-26 ENCOUNTER — Other Ambulatory Visit: Payer: Self-pay

## 2023-02-26 ENCOUNTER — Telehealth: Payer: Self-pay

## 2023-02-26 DIAGNOSIS — B20 Human immunodeficiency virus [HIV] disease: Secondary | ICD-10-CM

## 2023-02-26 MED ORDER — BICTEGRAVIR-EMTRICITAB-TENOFOV 50-200-25 MG PO TABS: 1.0000 | ORAL_TABLET | Freq: Every day | ORAL | 5 refills | Status: DC

## 2023-02-26 NOTE — Telephone Encounter (Signed)
 Keyleigh called to schedule an appointment for 2/11. She also requested a refill on Biktarvy  to the [facility] on [address].

## 2023-02-26 NOTE — Telephone Encounter (Signed)
 Refils sent to requested pharmacy

## 2023-02-27 ENCOUNTER — Ambulatory Visit: Payer: Medicaid Other | Admitting: Internal Medicine

## 2023-03-03 ENCOUNTER — Other Ambulatory Visit: Payer: Self-pay

## 2023-03-03 ENCOUNTER — Other Ambulatory Visit: Payer: Medicaid Other

## 2023-03-03 ENCOUNTER — Other Ambulatory Visit (HOSPITAL_COMMUNITY)
Admission: RE | Admit: 2023-03-03 | Discharge: 2023-03-03 | Disposition: A | Discharge status: Home / Self Care | Payer: Medicaid Other | Source: Ambulatory Visit | Attending: Internal Medicine | Admitting: Internal Medicine

## 2023-03-03 DIAGNOSIS — B20 Human immunodeficiency virus [HIV] disease: Secondary | ICD-10-CM

## 2023-03-03 DIAGNOSIS — Z113 Encounter for screening for infections with a predominantly sexual mode of transmission: Secondary | ICD-10-CM | POA: Insufficient documentation

## 2023-03-03 DIAGNOSIS — Z79899 Other long term (current) drug therapy: Secondary | ICD-10-CM

## 2023-03-04 ENCOUNTER — Encounter: Payer: Self-pay | Admitting: Internal Medicine

## 2023-03-04 ENCOUNTER — Ambulatory Visit (INDEPENDENT_AMBULATORY_CARE_PROVIDER_SITE_OTHER): Payer: Medicaid Other | Admitting: Internal Medicine

## 2023-03-04 ENCOUNTER — Other Ambulatory Visit: Payer: Self-pay

## 2023-03-04 VITALS — BP 156/87 | HR 66 | Temp 98.2°F | Wt 136.0 lb

## 2023-03-04 DIAGNOSIS — I159 Secondary hypertension, unspecified: Secondary | ICD-10-CM

## 2023-03-04 DIAGNOSIS — E785 Hyperlipidemia, unspecified: Secondary | ICD-10-CM | POA: Diagnosis not present

## 2023-03-04 DIAGNOSIS — I10 Essential (primary) hypertension: Secondary | ICD-10-CM

## 2023-03-04 DIAGNOSIS — B20 Human immunodeficiency virus [HIV] disease: Secondary | ICD-10-CM | POA: Diagnosis not present

## 2023-03-04 DIAGNOSIS — Z8619 Personal history of other infectious and parasitic diseases: Secondary | ICD-10-CM

## 2023-03-04 LAB — URINE CYTOLOGY ANCILLARY ONLY
Chlamydia: NEGATIVE
Comment: NEGATIVE
Comment: NORMAL
Neisseria Gonorrhea: NEGATIVE

## 2023-03-04 LAB — T-HELPER CELL (CD4) - (RCID CLINIC ONLY)
CD4 % Helper T Cell: 44 % (ref 33–65)
CD4 T Cell Abs: 1340 /uL (ref 400–1790)

## 2023-03-04 MED ORDER — AMLODIPINE BESYLATE 5 MG PO TABS: 5.0000 mg | ORAL_TABLET | Freq: Every day | ORAL | 11 refills | Status: DC

## 2023-03-04 MED ORDER — ATORVASTATIN CALCIUM 40 MG PO TABS: 40.0000 mg | ORAL_TABLET | Freq: Every day | ORAL | 11 refills | Status: DC

## 2023-03-04 NOTE — Progress Notes (Signed)
[facility]   Cc: hiv f/u  HPI: Daisy Salinas is a 63 y.o. female presents for HIV management f/u  #hiv Previously seen dr Orvan Falconer; last seen by Dr Thedore Mins on 10/02/22 Dx'ed 1989s Cd4 nadir unknown but said she never took prophylaxis Hx OI - none Therapy: Started therapy late 1990s Started tx early 2000s with Atripla  --> March 13, 2014 tivicay/truvada --> 03/13/14 genvoya --> 13-Mar-2021 biktarvy Doesn't appear any virologic failure  Ran out of biktarvy for the past month  She hasn't taken other medications for 8 months   #hx hep c Treated with harvoni in 03-13-14; cured  #hx syphilis Previous treated 1970s when she was first treated as late latent. Had it again 1990s treated Titer stable 1:1 serofast since Mar 14, 2015  #social Sexually active but not in any relationship Lives in [place] alone Husband passed away 03-14-19  Still smoke 1p/2days None of her kids have hiv Part time work [employer] officer, [employer] Hx substance - intranasal cocaine; distant hx iv Smokes weed every now and then  Still snorted cocaine here and there Occasional etoh  Other medical problem #depression #htn #tobacco use #copd -- not on oxygen     Past Medical History:  Diagnosis Date   Asthma    Bipolar 1 disorder (HCC) 2007/03/14   Depression    GERD (gastroesophageal reflux disease)    HIV (human immunodeficiency virus infection) (HCC)    Hypertension    TB (pulmonary tuberculosis) 1989   was exposed and treated   UTI (urinary tract infection) 1997    Past Surgical History:  Procedure Laterality Date   ABDOMINAL HYSTERECTOMY      Family History  Problem Relation Age of Onset   Coronary artery disease Mother    Leukemia Father    Current Outpatient Medications on File Prior to Visit  Medication Sig Dispense Refill   amLODipine (NORVASC) 5 MG tablet Take 1 tablet (5 mg total) by mouth daily. 30 tablet 11   bictegravir-emtricitabine-tenofovir AF (BIKTARVY) 50-200-25 MG TABS tablet Take 1  tablet by mouth daily. 30 tablet 5   divalproex (DEPAKOTE) 500 MG DR tablet Take 1 tablet (500 mg total) by mouth 2 (two) times daily. 60 tablet 11   meloxicam (MOBIC) 15 MG tablet One tab PO qAM with breakfast for 2 weeks, then daily prn pain. 30 tablet 0   QUEtiapine (SEROQUEL) 100 MG tablet Take 1 tablet (100 mg total) by mouth at bedtime. 30 tablet 11   sertraline (ZOLOFT) 50 MG tablet Take 1 tablet (50 mg total) by mouth daily. 30 tablet 11   No current facility-administered medications on file prior to visit.    No Known Allergies    Lab Results HIV 1 RNA Quant (Copies/mL)  Date Value  09/18/2022 29 (H)  01/09/2022 <20 (H)  08/14/2021 Not Detected   CD4 T Cell Abs (/uL)  Date Value  03/03/2023 1,340  09/18/2022 1,808 (H)  01/09/2022 1,608   Lab Results  Component Value Date   HIV1GENOSEQ REPORT 01/12/2014   Lab Results  Component Value Date   WBC 6.2 03/03/2023   HGB 13.2 03/03/2023   HCT 38.9 03/03/2023   MCV 97.7 03/03/2023   PLT 130 (L) 03/03/2023    Lab Results  Component Value Date   CREATININE 0.81 03/03/2023   BUN 15 03/03/2023   NA 140 03/03/2023   K 3.4 (L) 03/03/2023   CL 107 03/03/2023   CO2 25 03/03/2023   Lab Results  Component  Value Date   ALT 4 (L) 03/03/2023   AST 19 03/03/2023   ALKPHOS 51 02/22/2021   BILITOT 0.6 03/03/2023    Lab Results  Component Value Date   CHOL 183 03/03/2023   TRIG 126 03/03/2023   HDL 42 (L) 03/03/2023   LDLCALC 117 (H) 03/03/2023   No results found for: "HAV" Lab Results  Component Value Date   HEPBSAG NON-REACTIVE 10/14/2019   HEPBSAB NON-REACTIVE 10/14/2019   Lab Results  Component Value Date   HCVAB YES 03/17/2006   Lab Results  Component Value Date   CHLAMYDIAWP Negative 03/03/2023   N Negative 03/03/2023   No results found for: "GCPROBEAPT" No results found for: "QUANTGOLD"  Assessment/Plan #hiv Previously seen dr Orvan Falconer; last seen by Dr Thedore Mins on 10/02/22 Dx'ed 1989s Cd4 nadir  unknown but said she never took prophylaxis Hx OI - none Therapy: Started therapy late 1990s Started tx early 2000s with Atripla  --> 2016 tivicay/truvada --> 2016 genvoya --> 2023 biktarvy Doesn't appear any virologic failure  Ran out of biktarvy for the past month  She hasn't taken other medications for 8 months  Will restart biktarvy -- rx sent to [facility] on [address] (medicaid patient)  -discussed u=u -encourage compliance -continue current HIV medication -labs next time 3 months -f/u in 3 months   #hx hep c Treated with harvoni in 2016; cured Snorted cocaine so will recheck next visit  -hep c/b next visit  #hx syphilis Previous treated 1970s when she was first treated as late latent. Had it again 1990s treated Titer stable 1:1 serofast since 2017  -pending rpr; gc/chlam negative   #htn/hlp #cad risk Discuss reprieve Out of meds for 7 months as of 02/2023 visit  -start lipitor 40 mg -restart amlodpine 5 mg  -advise f/u pcp for htn/hlp and other medical problems/reengagement with meds   #hcm -vaccination Will review -hepatitis 2021 hep b cAb positive; sAg and sAb negative Treated hep c but snorting cocaine will repeat testing next visit 3 months -std screen Rpr pending Urine gc/chlam negative -tb screen Will review -women's health cancer screening F/u pcp         Raymondo Band, MD [facility] [phone]  pager   [phone] cell 03/04/2023, 3:59 PM

## 2023-03-04 NOTE — Patient Instructions (Signed)
3 meds to pick up today    Biktarvy Amlodipine 5 mg for high blood pressure Atorvastatin 40 mg for cholesterol   Please see your primary care doctor Gwinda Passe for continued care of chronic medical problems and also women's health/cancer screening   Please see me in 3 months for follow up blood testing    Will have to retest hepatitis given snorting cocaine could reinfect you

## 2023-03-05 ENCOUNTER — Other Ambulatory Visit: Payer: Self-pay | Admitting: Pharmacist

## 2023-03-05 DIAGNOSIS — B20 Human immunodeficiency virus [HIV] disease: Secondary | ICD-10-CM

## 2023-03-05 LAB — HIV-1 RNA QUANT-NO REFLEX-BLD
HIV 1 RNA Quant: NOT DETECTED {copies}/mL
HIV-1 RNA Quant, Log: NOT DETECTED {Log_copies}/mL

## 2023-03-05 LAB — COMPLETE METABOLIC PANEL WITH GFR
AG Ratio: 1.4 (calc) (ref 1.0–2.5)
ALT: 4 U/L — ABNORMAL LOW (ref 6–29)
AST: 19 U/L (ref 10–35)
Albumin: 4.4 g/dL (ref 3.6–5.1)
Alkaline phosphatase (APISO): 67 U/L (ref 37–153)
BUN: 15 mg/dL (ref 7–25)
CO2: 25 mmol/L (ref 20–32)
Calcium: 9.6 mg/dL (ref 8.6–10.4)
Chloride: 107 mmol/L (ref 98–110)
Creat: 0.81 mg/dL (ref 0.50–1.05)
Globulin: 3.1 g/dL (ref 1.9–3.7)
Glucose, Bld: 105 mg/dL — ABNORMAL HIGH (ref 65–99)
Potassium: 3.4 mmol/L — ABNORMAL LOW (ref 3.5–5.3)
Sodium: 140 mmol/L (ref 135–146)
Total Bilirubin: 0.6 mg/dL (ref 0.2–1.2)
Total Protein: 7.5 g/dL (ref 6.1–8.1)
eGFR: 82 mL/min/{1.73_m2} (ref >=60)

## 2023-03-05 LAB — T PALLIDUM AB: T Pallidum Abs: POSITIVE — AB

## 2023-03-05 LAB — CBC WITH DIFFERENTIAL/PLATELET
Absolute Lymphocytes: 2933 {cells}/uL (ref 850–3900)
Absolute Monocytes: 372 {cells}/uL (ref 200–950)
Basophils Absolute: 31 {cells}/uL (ref 0–200)
Basophils Relative: 0.5 %
Eosinophils Absolute: 50 {cells}/uL (ref 15–500)
Eosinophils Relative: 0.8 %
HCT: 38.9 % (ref 35.0–45.0)
Hemoglobin: 13.2 g/dL (ref 11.7–15.5)
MCH: 33.2 pg — ABNORMAL HIGH (ref 27.0–33.0)
MCHC: 33.9 g/dL (ref 32.0–36.0)
MCV: 97.7 fL (ref 80.0–100.0)
MPV: 12.8 fL — ABNORMAL HIGH (ref 7.5–12.5)
Monocytes Relative: 6 %
Neutro Abs: 2815 {cells}/uL (ref 1500–7800)
Neutrophils Relative %: 45.4 %
Platelets: 130 Thousand/uL — ABNORMAL LOW (ref 140–400)
RBC: 3.98 Million/uL (ref 3.80–5.10)
RDW: 12.2 % (ref 11.0–15.0)
Total Lymphocyte: 47.3 %
WBC: 6.2 Thousand/uL (ref 3.8–10.8)

## 2023-03-05 LAB — RPR TITER: RPR Titer: 1:1 {titer} — ABNORMAL HIGH

## 2023-03-05 LAB — LIPID PANEL
Cholesterol: 183 mg/dL (ref <200)
HDL: 42 mg/dL — ABNORMAL LOW (ref >=50)
LDL Cholesterol (Calc): 117 mg/dL — ABNORMAL HIGH
Non-HDL Cholesterol (Calc): 141 mg/dL — ABNORMAL HIGH (ref <130)
Total CHOL/HDL Ratio: 4.4 (calc) (ref <5.0)
Triglycerides: 126 mg/dL (ref <150)

## 2023-03-05 LAB — SYPHILIS: RPR W/REFLEX TO RPR TITER AND TREPONEMAL ANTIBODIES, TRADITIONAL SCREENING AND DIAGNOSIS ALGORITHM: RPR Ser Ql: REACTIVE — AB

## 2023-03-05 MED ORDER — BIKTARVY 50-200-25 MG PO TABS: 1.0000 | ORAL_TABLET | Freq: Every day | ORAL | Status: AC

## 2023-03-05 NOTE — Progress Notes (Signed)
Medication Samples have been provided to the patient.  Drug name: Biktarvy        Strength: 50/200/25 mg       Qty: 7 tablets   LOT: CSCFVA   Exp.Date: 10/2024  Samples requested by Dr. Renold Don.  Dosing instructions: Take one tablet by mouth once daily  The patient has been instructed regarding the correct time, dose, and frequency of taking this medication, including desired effects and most common side effects.   Knoah Nedeau L. Jannette Fogo, PharmD, BCIDP, AAHIVP, CPP Clinical Pharmacist Practitioner Infectious Diseases Clinical Pharmacist [facility] 01/03/2020, 10:07 AM

## 2023-03-18 ENCOUNTER — Encounter (INDEPENDENT_AMBULATORY_CARE_PROVIDER_SITE_OTHER): Payer: Self-pay | Admitting: Primary Care

## 2023-03-18 ENCOUNTER — Ambulatory Visit (INDEPENDENT_AMBULATORY_CARE_PROVIDER_SITE_OTHER): Payer: Medicaid Other | Admitting: Primary Care

## 2023-03-18 VITALS — BP 187/102 | HR 57

## 2023-03-18 DIAGNOSIS — F17210 Nicotine dependence, cigarettes, uncomplicated: Secondary | ICD-10-CM | POA: Diagnosis not present

## 2023-03-18 DIAGNOSIS — Z1231 Encounter for screening mammogram for malignant neoplasm of breast: Secondary | ICD-10-CM

## 2023-03-18 DIAGNOSIS — Z7689 Persons encountering health services in other specified circumstances: Secondary | ICD-10-CM | POA: Diagnosis not present

## 2023-03-18 DIAGNOSIS — Z1211 Encounter for screening for malignant neoplasm of colon: Secondary | ICD-10-CM

## 2023-03-18 DIAGNOSIS — Z720 Tobacco use: Secondary | ICD-10-CM

## 2023-03-18 DIAGNOSIS — I10 Essential (primary) hypertension: Secondary | ICD-10-CM

## 2023-03-18 DIAGNOSIS — Z122 Encounter for screening for malignant neoplasm of respiratory organs: Secondary | ICD-10-CM | POA: Diagnosis not present

## 2023-03-18 MED ORDER — AMLODIPINE-OLMESARTAN 10-40 MG PO TABS: 1.0000 | ORAL_TABLET | Freq: Every day | ORAL | 3 refills | Status: DC

## 2023-03-18 NOTE — Progress Notes (Unsigned)
 New Patient Office Visit  Subjective    Patient ID: Daisy Salinas female  DOB: [birth year]  Age: 63 y.o. MRN: [id number]   CC:  Elevated Bp   Hypertension    Ms.Graciella Arment is a 63 year old female in today to establish care.  Her major concern is elevated blood pressure.  She has no medication for her blood pressure. She endorses headaches, dizziness, shortness of breath on exertion.  States her chest pains comes from her indigestion.  She recently saw Dr. Renold Don as a new patient and restarted medication. Current Outpatient Medications on File Prior to Visit  Medication Sig Dispense Refill   atorvastatin (LIPITOR) 40 MG tablet Take 1 tablet (40 mg total) by mouth daily. 30 tablet 11   bictegravir-emtricitabine-tenofovir AF (BIKTARVY) 50-200-25 MG TABS tablet Take 1 tablet by mouth daily. 30 tablet 5   meloxicam (MOBIC) 15 MG tablet One tab PO qAM with breakfast for 2 weeks, then daily prn pain. 30 tablet 0   No current facility-administered medications on file prior to visit.     No Known Allergies  Past Medical History:  Diagnosis Date   Asthma    Bipolar 1 disorder (HCC) 2009   Depression    GERD (gastroesophageal reflux disease)    HIV (human immunodeficiency virus infection) (HCC)    Hypertension    TB (pulmonary tuberculosis) 1989   was exposed and treated   UTI (urinary tract infection) 1997     Past Surgical History:  Procedure Laterality Date   ABDOMINAL HYSTERECTOMY       Family History  Problem Relation Age of Onset   Coronary artery disease Mother    Leukemia Father     Social History   Socioeconomic History   Marital status: Widowed    Spouse name: Not on file   Number of children: Not on file   Years of education: Not on file   Highest education level: Not on file  Occupational History   Occupation: unemployed  Tobacco Use   Smoking status: Every Day    Current packs/day: 0.70    Average packs/day: 0.7 packs/day for 45.0 years (31.5 ttl  pk-yrs)    Types: Cigarettes   Smokeless tobacco: Never   Tobacco comments:    wanting "some cinnamon" to help her stop  Vaping Use   Vaping status: Never Used  Substance and Sexual Activity   Alcohol use: Yes    Alcohol/week: 2.0 standard drinks of alcohol    Types: 1 Cans of beer, 1 Standard drinks or equivalent per week    Comment: occ   Drug use: Yes    Frequency: 1.0 times per week    Types: Cocaine    Comment: Occassional use   Sexual activity: Not Currently    Partners: Male    Comment: declined condoms  Other Topics Concern   Not on file  Social History Narrative   Not on file   Social Drivers of Health   Financial Resource Strain: Not on file  Food Insecurity: Not on file  Transportation Needs: Not on file  Physical Activity: Not on file  Stress: Not on file  Social Connections: Unknown (05/20/2021)   [facility], [facility]   Social Connections    Frequency of Communication with Friends and Family: Not asked    Frequency of Social Gatherings with Friends and Family: Not asked  Intimate Partner Violence: Unknown (05/20/2021)   Received from [facility], [facility]   Intimate Partner Violence  Fear of Current or Ex-Partner: Not asked    Emotionally Abused: Not asked    Physically Abused: Not asked    Sexually Abused: Not asked   Health Maintenance  Topic Date Due   Zoster (Shingles) Vaccine (1 of 2) Never done   Mammogram  Never done   Pneumococcal Vaccination (3 of 3 - PCV) 12/01/2010   Pap with HPV screening  03/25/2011   Colon Cancer Screening  06/23/2018   Screening for Lung Cancer  02/23/2022   COVID-19 Vaccine (4 - 2024-25 season) 11/27/2022   DTaP/Tdap/Td vaccine (2 - Td or Tdap) 10/08/2028   Flu Shot  Completed   Hepatitis C Screening  Completed   HIV Screening  Completed   HPV Vaccine  Aged Out    Objective    BP (!) 187/102 (BP Location: Right Arm, Patient Position: Sitting, Cuff Size: Normal)   Pulse (!) 57    SpO2 100%  BP Readings from Last 3 Encounters:  03/18/23 (!) 187/102  03/04/23 (!) 156/87  10/07/22 (!) 150/90   Physical Exam Vitals reviewed.  Constitutional:      Appearance: Normal appearance.  HENT:     Head: Normocephalic.     Right Ear: Tympanic membrane, ear canal and external ear normal.     Left Ear: Tympanic membrane, ear canal and external ear normal.     Nose: Nose normal.  Eyes:     Extraocular Movements: Extraocular movements intact.     Pupils: Pupils are equal, round, and reactive to light.  Cardiovascular:     Rate and Rhythm: Normal rate and regular rhythm.  Pulmonary:     Effort: Pulmonary effort is normal.     Breath sounds: Normal breath sounds.  Abdominal:     General: Bowel sounds are normal.     Palpations: Abdomen is soft.  Musculoskeletal:        General: Normal range of motion.     Cervical back: Normal range of motion.  Skin:    General: Skin is warm and dry.  Neurological:     Mental Status: She is alert and oriented to person, place, and time.      Assessment & Plan:  Rozalyn was seen today for hypertension.  Diagnoses and all orders for this visit:  Encounter to establish care  Encounter for screening mammogram for malignant neoplasm of breast -     MM DIGITAL SCREENING BILATERAL; Future  Colon cancer screening -     Ambulatory referral to Gastroenterology  Screening for lung cancer  Tobacco abuse 1/2 ppd a day - I have recommended complete cessation of tobacco use. I have discussed various options available for assistance with tobacco cessation including over the counter methods (Nicotine gum, patch and lozenges). We also discussed prescription options (Chantix, Nicotine Inhaler / Nasal Spray). The patient is not interested in pursuing any prescription tobacco cessation options at this time. - Patient declines at this time.  - Less than 5 minutes spent on counseling.   Essential hypertension BP goal - < 140/90  Explained that  having normal blood pressure is the goal and medications are helping to get to goal and maintain normal blood pressure. DIET: Limit salt intake, read nutrition labels to check salt content, limit fried and high fatty foods  Avoid using multisymptom OTC cold preparations that generally contain sudafed which can rise BP. Consult with pharmacist on best cold relief products to use for persons with HTN EXERCISE Discussed incorporating exercise such as walking - 30  minutes most days of the week and can do in 10 minute intervals    -     amLODipine-olmesartan (AZOR) 10-40 MG tablet; Take 1 tablet by mouth daily.    Follow-up:  Nurse visit Bp ck 2 weeks  The above assessment and management plan was discussed with the patient. The patient verbalized understanding of and has agreed to the management plan. Patient is aware to call the clinic if symptoms fail to improve or worsen. Patient is aware when to return to the clinic for a follow-up visit. Patient educated on when it is appropriate to go to the emergency department.   Gwinda Passe, NP-C

## 2023-03-19 ENCOUNTER — Other Ambulatory Visit: Payer: Self-pay | Admitting: Pharmacist

## 2023-03-19 ENCOUNTER — Other Ambulatory Visit: Payer: Self-pay

## 2023-03-19 ENCOUNTER — Telehealth (INDEPENDENT_AMBULATORY_CARE_PROVIDER_SITE_OTHER): Payer: Self-pay | Admitting: Primary Care

## 2023-03-19 ENCOUNTER — Ambulatory Visit (INDEPENDENT_AMBULATORY_CARE_PROVIDER_SITE_OTHER): Payer: Self-pay | Admitting: Primary Care

## 2023-03-19 MED ORDER — AMLODIPINE BESYLATE 10 MG PO TABS: 10.0000 mg | ORAL_TABLET | Freq: Every day | ORAL | 1 refills | Status: DC

## 2023-03-19 MED ORDER — OLMESARTAN MEDOXOMIL 40 MG PO TABS: 40.0000 mg | ORAL_TABLET | Freq: Every day | ORAL | 1 refills | Status: DC

## 2023-03-19 NOTE — Telephone Encounter (Signed)
 BP 130 over 180 and pt was having vision issues. Pt also said she saw black dots and she did not have any chest pains or cough. Pt refused to go to the ED. Pt just saw Dr Randa Evens on 2/25. Pt just said she needed medication and that she did not want to go to the ED.

## 2023-03-19 NOTE — Telephone Encounter (Signed)
 Talked with provider since pt refused ED she needs to pick medication up that was prescribed yesterday   Called patient and left voicemail

## 2023-03-19 NOTE — Telephone Encounter (Signed)
 Chief Complaint: Hypertension Symptoms: Black dots in vision Frequency: Intermittent Pertinent Negatives: Patient denies chest pain, difficulty breathing, headache, weakness Disposition: [x] ED /[] Urgent Care (no appt availability in office) / [] Appointment(In office/virtual)/ []  [facility]/ [] Home Care/ [] Refused Recommended Disposition /[] [facility]/ []  Follow-up with PCP Additional Notes: Pt states her BP was 130/180 when she checked it at 6:30 AM today. This RN asked pt if she could check BP again while on the phone but pt states she checked it at her family member's house and doesn't have a monitor. Pt states she has been seeing black dots in her vision. Pt advised to go to ED but pt refused. Office called and spoke with Kellin to notify pt's ED refusal. Pt states she has not taken BP medication in two weeks. Pt established care yesterday with Gwinda Passe, NP, and was prescribed amlodipine-olmesartan 10-40 mg 1 tablet oral daily. This RN educated pt on care advice and emphasized importance of going to ED now.    Gwinda Passe Copied from [employer] [id number]. Topic: Clinical - Red Word Triage >> Mar 19, 2023  9:32 AM Tiffany B wrote: Red Word that prompted transfer to Nurse Triage: Caller states her BP is high 130/180 and she is seeing spots. Reason for Disposition  [1] Systolic BP  >= 160 OR Diastolic >= 100 AND [2] cardiac (e.g., breathing difficulty, chest pain) or neurologic symptoms (e.g., new-onset blurred or double vision, unsteady gait)  Answer Assessment - Initial Assessment Questions 1. BLOOD PRESSURE: "What is the blood pressure?" "Did you take at least two measurements 5 minutes apart?"     130/180 at 6:30 AM this morning; doesn't have a monitor right now 2. ONSET: "When did you take your blood pressure?"     6:30 AM 3. HOW: "How did you take your blood pressure?" (e.g., automatic home BP monitor, visiting nurse)     Automatic home BP 4. HISTORY: "Do you  have a history of high blood pressure?"     Yes 5. MEDICINES: "Are you taking any medicines for blood pressure?" "Have you missed any doses recently?"    Amlodipine, haven't taken it in 2 weeks out of medicaiton 6. OTHER SYMPTOMS: "Do you have any symptoms?" (e.g., blurred vision, chest pain, difficulty breathing, headache, weakness)     Seeing black spots in vision  Protocols used: Blood Pressure - High-A-AH

## 2023-03-23 ENCOUNTER — Emergency Department (HOSPITAL_COMMUNITY)
Admission: EM | Admit: 2023-03-23 | Discharge: 2023-03-23 | Disposition: A | Discharge status: Home / Self Care | Attending: Emergency Medicine | Admitting: Emergency Medicine

## 2023-03-23 ENCOUNTER — Emergency Department (HOSPITAL_BASED_OUTPATIENT_CLINIC_OR_DEPARTMENT_OTHER)

## 2023-03-23 ENCOUNTER — Other Ambulatory Visit: Payer: Self-pay

## 2023-03-23 ENCOUNTER — Encounter (HOSPITAL_COMMUNITY): Payer: Self-pay

## 2023-03-23 DIAGNOSIS — J45909 Unspecified asthma, uncomplicated: Secondary | ICD-10-CM | POA: Insufficient documentation

## 2023-03-23 DIAGNOSIS — Z79899 Other long term (current) drug therapy: Secondary | ICD-10-CM | POA: Diagnosis not present

## 2023-03-23 DIAGNOSIS — Z21 Asymptomatic human immunodeficiency virus [HIV] infection status: Secondary | ICD-10-CM | POA: Diagnosis not present

## 2023-03-23 DIAGNOSIS — M79605 Pain in left leg: Secondary | ICD-10-CM | POA: Diagnosis present

## 2023-03-23 DIAGNOSIS — I10 Essential (primary) hypertension: Secondary | ICD-10-CM | POA: Diagnosis not present

## 2023-03-23 DIAGNOSIS — M79662 Pain in left lower leg: Secondary | ICD-10-CM | POA: Diagnosis not present

## 2023-03-23 MED ORDER — IBUPROFEN 400 MG PO TABS
600.0000 mg | ORAL_TABLET | Freq: Once | ORAL | Status: AC
  Administered 2023-03-23: 600 mg via ORAL
  Filled 2023-03-23: qty 1

## 2023-03-23 NOTE — Progress Notes (Signed)
 VASCULAR LAB    Left lower extremity venous duplex has been performed.  See CV proc for preliminary results.  Messaged negative results to [facility], PA-C  Michie Molnar, [facility], RVT 03/23/2023, 5:21 PM

## 2023-03-23 NOTE — ED Notes (Signed)
Transported to Vascular. °

## 2023-03-23 NOTE — Discharge Instructions (Addendum)
 It was a pleasure caring for you today.  As discussed, you will need to follow-up with an orthopedic provider.  Please call the orthopedic provider listed in this discharge paperwork first thing tomorrow morning to establish an appointment.  Seek emergency care if experiencing any new or worsening symptoms.  Alternating between 650 mg Tylenol and 400 mg Advil: The best way to alternate taking Acetaminophen (example Tylenol) and Ibuprofen (example Advil/Motrin) is to take them 3 hours apart. For example, if you take ibuprofen at 6 am you can then take Tylenol at 9 am. You can continue this regimen throughout the day, making sure you do not exceed the recommended maximum dose for each drug.

## 2023-03-23 NOTE — ED Triage Notes (Signed)
 Pt arrived POV from home c/o left leg pain x several months. Pt states she has had xrays done that show nothing is wrong but the pain will not go away.

## 2023-03-23 NOTE — ED Provider Notes (Signed)
 [place] EMERGENCY DEPARTMENT AT [facility] Provider Note   CSN: [id number] Arrival date & time: 03/23/23  1500     History  Chief Complaint  Patient presents with   Leg Pain    Daisy Salinas is a 63 y.o. female with PMHx bipolar 1 disorder, asthma, depression, GERD, HIV, HTN who presents to ED concern for left leg pain for several months. Pain starts in left posterior mid-thigh and radiates down to left calf. States that it feels like a muscle is pulling. Patient stating that she had x-rays in the past which were unremarkable.  Denies recent trauma.  Denies any infectious symptoms.  Patient has not taken any pain medicine in many days for this symptom.   Leg Pain      Home Medications Prior to Admission medications   Medication Sig Start Date End Date Taking? Authorizing Provider  amLODipine (NORVASC) 10 MG tablet Take 1 tablet (10 mg total) by mouth daily. 03/19/23   Hoy Register, MD  atorvastatin (LIPITOR) 40 MG tablet Take 1 tablet (40 mg total) by mouth daily. 03/04/23 02/27/24  Vu, Tonita Phoenix, MD  bictegravir-emtricitabine-tenofovir AF (BIKTARVY) 50-200-25 MG TABS tablet Take 1 tablet by mouth daily. 02/26/23   Danelle Earthly, MD  meloxicam (MOBIC) 15 MG tablet One tab PO qAM with breakfast for 2 weeks, then daily prn pain. 10/14/22   Rodolph Bong, MD  olmesartan (BENICAR) 40 MG tablet Take 1 tablet (40 mg total) by mouth daily. 03/19/23   Hoy Register, MD      Allergies    Patient has no known allergies.    Review of Systems   Review of Systems  Musculoskeletal:        Leg pain    Physical Exam Updated Vital Signs BP (!) 140/79 (BP Location: Right Arm)   Pulse 71   Temp 98.3 F (36.8 C) (Oral)   Resp 17   Ht 4\' 11"  (1.499 m)   Wt 59 kg   SpO2 98%   BMI 26.26 kg/m  Physical Exam Vitals and nursing note reviewed.  Constitutional:      General: She is not in acute distress.    Appearance: She is not ill-appearing or toxic-appearing.  HENT:      Head: Normocephalic and atraumatic.  Eyes:     General: No scleral icterus.       Right eye: No discharge.        Left eye: No discharge.     Conjunctiva/sclera: Conjunctivae normal.  Cardiovascular:     Rate and Rhythm: Normal rate.  Pulmonary:     Effort: Pulmonary effort is normal.  Abdominal:     General: Abdomen is flat.  Musculoskeletal:     Comments: Left leg: +2 pedal pulse.  Sensation to light touch intact.  Area nontense.  ROM intact.  Patient ambulatory with steady gait. No skin changes.  Skin:    General: Skin is warm and dry.  Neurological:     General: No focal deficit present.     Mental Status: She is alert. Mental status is at baseline.  Psychiatric:        Mood and Affect: Mood normal.        Behavior: Behavior normal.     ED Results / Procedures / Treatments   Labs (all labs ordered are listed, but only abnormal results are displayed) Labs Reviewed - No data to display  EKG None  Radiology VAS [place] LOWER EXTREMITY VENOUS (DVT) (7a-7p) Result Date:  03/23/2023  Lower Venous DVT Study Patient Name:  Daisy Salinas  Date of Exam:   03/23/2023 Medical Rec #: [id number]         Accession #:    [id number] Date of Birth: [birth year]         Patient Gender: F Patient Age:   39 years Exam Location:  [facility] Procedure:      VAS [place] LOWER EXTREMITY VENOUS (DVT) Referring Phys: [facility] Lurene Robley --------------------------------------------------------------------------------  Indications: Left lateral and posterior thigh pain that radiates to knee x several months  Comparison Study: No prior study on file Performing Technologist: Sherren Kerns RVS  Examination Guidelines: A complete evaluation includes B-mode imaging, spectral Doppler, color Doppler, and power Doppler as needed of all accessible portions of each vessel. Bilateral testing is considered an integral part of a complete examination. Limited examinations for reoccurring indications may be performed as noted.  The reflux portion of the exam is performed with the patient in reverse Trendelenburg.  +-----+---------------+---------+-----------+------------------+--------------+ RIGHTCompressibilityPhasicitySpontaneityProperties        Thrombus Aging +-----+---------------+---------+-----------+------------------+--------------+ CFV  Full           Yes      No         pulsatile waveform               +-----+---------------+---------+-----------+------------------+--------------+ SFJ  Full                                                                +-----+---------------+---------+-----------+------------------+--------------+   +---------+---------------+---------+-----------+---------------+-------------+ LEFT     CompressibilityPhasicitySpontaneityProperties     Thrombus                                                                 Aging         +---------+---------------+---------+-----------+---------------+-------------+ CFV      Full           Yes      No         pulsatile                                                                waveform                     +---------+---------------+---------+-----------+---------------+-------------+ SFJ      Full                                                            +---------+---------------+---------+-----------+---------------+-------------+ FV Prox  Full                                                            +---------+---------------+---------+-----------+---------------+-------------+  FV Mid   Full                                                            +---------+---------------+---------+-----------+---------------+-------------+ FV DistalFull                                                            +---------+---------------+---------+-----------+---------------+-------------+ PFV      Full                                                             +---------+---------------+---------+-----------+---------------+-------------+ POP      Full           Yes      No         pulsatile                                                                waveform                     +---------+---------------+---------+-----------+---------------+-------------+ PTV      Full                                                            +---------+---------------+---------+-----------+---------------+-------------+ PERO     Full                                                            +---------+---------------+---------+-----------+---------------+-------------+ Gastroc  Full           Yes      No         pulsatile                                                                waveform                     +---------+---------------+---------+-----------+---------------+-------------+    Summary: RIGHT: - No evidence of common femoral vein obstruction.  pulsatile waveform  LEFT: - There is no evidence of deep vein thrombosis in the lower extremity.  - No cystic structure found in the popliteal  fossa. Pulsatile waveforms throughout.  *See table(s) above for measurements and observations.    Preliminary     Procedures Procedures    Medications Ordered in ED Medications  ibuprofen (ADVIL) tablet 600 mg (has no administration in time range)    ED Course/ Medical Decision Making/ A&P                                 Medical Decision Making  This patient presents to the ED for concern of leg pain, this involves an extensive number of treatment options, and is a complaint that carries with it a high risk of complications and morbidity.  The differential diagnosis includes fracture, DVT, tendinitis, bursitis, gout, septic joint, hemarthrosis, arthritis, sciatica   Co morbidities that complicate the patient evaluation  bipolar 1 disorder, asthma, depression, GERD, HIV, HTN    Additional history obtained:  Dr.  Randa Evens PCP Patient saw Dr. Denyse Amass 09/2022 (orthopedic) for same complaint: "Most likely explanation is hip abductor tendinopathy and trochanteric bursitis. She may have a component of meralgia paresthetica or even lumbar radiculopathy."   Imaging Studies ordered:  I ordered imaging studies including DVT US  I independently visualized and interpreted imaging which showed no acute process I agree with the radiologist interpretation   Problem List / ED Course / Critical interventions / Medication management  Patient presents to ED concerned for chronic leg pain. Pain radiates down posterior thigh and into calf - suspicious for sciatica. Patient has not recently taken any pain medication. Physical exam unremarkable. Patient ambulatory. Patient afebrile with stable vitals. Patient stating that xrays in the past were unremarkable. No recent trauma. DVT US today without acute process.  Patient does not seem to be suffering from emergent process requiring hospital admission today.  Educated patient that she will need to follow up with orthopedics. Patient verbalized understanding of plan. Patient requesting Advil which I ordered for her. Educated patient on alternating ibuprofen and tylenol for pain management. I have reviewed the patients home medicines and have made adjustments as needed Patient afebrile with stable vitals.  Provided with return precautions.  Discharged in good condition.   Social Determinants of Health:  none         Final Clinical Impression(s) / ED Diagnoses Final diagnoses:  Left leg pain    Rx / DC Orders ED Discharge Orders     None         Margarita Rana 03/23/23 1807    Rondel Baton, MD 03/24/23 1159

## 2023-03-24 ENCOUNTER — Ambulatory Visit
Admission: RE | Admit: 2023-03-24 | Discharge: 2023-03-24 | Disposition: A | Discharge status: Home / Self Care | Payer: Medicaid Other | Source: Ambulatory Visit | Attending: Primary Care | Admitting: Primary Care

## 2023-03-24 ENCOUNTER — Other Ambulatory Visit (INDEPENDENT_AMBULATORY_CARE_PROVIDER_SITE_OTHER): Payer: Self-pay | Admitting: Primary Care

## 2023-03-24 DIAGNOSIS — I10 Essential (primary) hypertension: Secondary | ICD-10-CM

## 2023-03-24 DIAGNOSIS — Z1231 Encounter for screening mammogram for malignant neoplasm of breast: Secondary | ICD-10-CM

## 2023-03-24 DIAGNOSIS — Z122 Encounter for screening for malignant neoplasm of respiratory organs: Secondary | ICD-10-CM

## 2023-03-24 DIAGNOSIS — Z7689 Persons encountering health services in other specified circumstances: Secondary | ICD-10-CM

## 2023-03-24 DIAGNOSIS — Z1211 Encounter for screening for malignant neoplasm of colon: Secondary | ICD-10-CM

## 2023-03-24 DIAGNOSIS — Z720 Tobacco use: Secondary | ICD-10-CM

## 2023-03-27 ENCOUNTER — Encounter (HOSPITAL_COMMUNITY): Payer: Self-pay | Admitting: Student

## 2023-03-27 ENCOUNTER — Other Ambulatory Visit: Payer: Self-pay | Admitting: Primary Care

## 2023-03-27 ENCOUNTER — Ambulatory Visit (INDEPENDENT_AMBULATORY_CARE_PROVIDER_SITE_OTHER): Payer: Medicaid Other | Admitting: Student

## 2023-03-27 VITALS — BP 149/92 | HR 72 | Ht 59.0 in | Wt 130.2 lb

## 2023-03-27 DIAGNOSIS — F411 Generalized anxiety disorder: Secondary | ICD-10-CM

## 2023-03-27 DIAGNOSIS — F1121 Opioid dependence, in remission: Secondary | ICD-10-CM

## 2023-03-27 DIAGNOSIS — F4381 Prolonged grief disorder: Secondary | ICD-10-CM

## 2023-03-27 DIAGNOSIS — F4312 Post-traumatic stress disorder, chronic: Secondary | ICD-10-CM

## 2023-03-27 DIAGNOSIS — Z8659 Personal history of other mental and behavioral disorders: Secondary | ICD-10-CM | POA: Diagnosis not present

## 2023-03-27 DIAGNOSIS — F29 Unspecified psychosis not due to a substance or known physiological condition: Secondary | ICD-10-CM

## 2023-03-27 DIAGNOSIS — F332 Major depressive disorder, recurrent severe without psychotic features: Secondary | ICD-10-CM | POA: Diagnosis not present

## 2023-03-27 DIAGNOSIS — R928 Other abnormal and inconclusive findings on diagnostic imaging of breast: Secondary | ICD-10-CM

## 2023-03-27 DIAGNOSIS — F1210 Cannabis abuse, uncomplicated: Secondary | ICD-10-CM

## 2023-03-27 DIAGNOSIS — F1590 Other stimulant use, unspecified, uncomplicated: Secondary | ICD-10-CM

## 2023-03-27 MED ORDER — QUETIAPINE FUMARATE 25 MG PO TABS: 25.0000 mg | ORAL_TABLET | Freq: Every day | ORAL | 1 refills | Status: DC | PRN

## 2023-03-27 MED ORDER — QUETIAPINE FUMARATE 50 MG PO TABS: 50.0000 mg | ORAL_TABLET | Freq: Every day | ORAL | 1 refills | Status: DC
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
## 2023-03-27 NOTE — Patient Instructions (Signed)
 [facility]  URGENT CARE: Open 24 hours per day for acute and/or urgent behavioral health concerns.   OUTPATIENT Walk-in information:  Please note, all walk-ins are first come & first serve, with limited number of availability. Therapist for therapy:  Monday, Tuesday, Wednesday & Thursday mornings Please ARRIVE at 7:00 AM for registration Will START at 8:00 AM Every 1st, 2nd & 3rd Friday of the month: Please ARRIVE at 7:00 AM for registration Will START at 1 PM - 5 PM Psychiatrist for medication management: Monday - Friday:  Please ARRIVE at 7:00 AM for registration Will START at 8:00 AM Appointments times are as follows:  - New patients get 1 hr ex: 8-9 am 9-10 & 10-11 then that's it.  - Existing pt's that are not seeing their provider will still take 1hr as they would be new to the provider that is covering walk ins that day.  - Existing follow ups get 30 mins.... (if they have been seen by the provider covering walk ins that day.)        Regretfully, due to limited availability, please be aware that you may not been seen on the same day as walk-in. Please consider making an appoint or try again. Thank you for your patience and understanding.  [facility] [place] [address] [place], [place] [address] [phone]  New patients are seen at their walk-in clinic. Walk-in hours are Monday - Friday from 8:30 am - 12:00 pm, and from 1:00 pm - 2:30 pm.   Walk-in patients are seen on a first come, first served basis, so try to arrive as early as possible for the best chance of being seen the same day.

## 2023-04-01 ENCOUNTER — Ambulatory Visit (INDEPENDENT_AMBULATORY_CARE_PROVIDER_SITE_OTHER): Payer: Medicaid Other | Admitting: Primary Care
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
## 2023-04-01 NOTE — Progress Notes (Addendum)
   Blood Pressure Recheck Visit  Name: Daisy Salinas MRN: [id number] Date of Birth: 28-Oct-1960  Daisy Salinas presents today for Blood Pressure recheck with clinical support staff.  Order for BP recheck by Dalene Carrow, RMA , ordered on 04/01/23.   BP Readings from Last 3 Encounters:  04/01/23 137/79  03/23/23 137/77  03/18/23 (!) 187/102    Current Outpatient Medications  Medication Sig Dispense Refill   amLODipine (NORVASC) 10 MG tablet Take 1 tablet (10 mg total) by mouth daily. 90 tablet 1   atorvastatin (LIPITOR) 40 MG tablet Take 1 tablet (40 mg total) by mouth daily. 30 tablet 11   bictegravir-emtricitabine-tenofovir AF (BIKTARVY) 50-200-25 MG TABS tablet Take 1 tablet by mouth daily. 30 tablet 5   meloxicam (MOBIC) 15 MG tablet One tab PO qAM with breakfast for 2 weeks, then daily prn pain. 30 tablet 0   olmesartan (BENICAR) 40 MG tablet Take 1 tablet (40 mg total) by mouth daily. 90 tablet 1   QUEtiapine (SEROQUEL) 25 MG tablet Take 1 tablet (25 mg total) by mouth daily as needed (anxiety). 30 tablet 1   QUEtiapine (SEROQUEL) 50 MG tablet Take 1-2 tablets (50-100 mg total) by mouth at bedtime. Take 1 tablet (50 mg) by mouth at bedtime x 15 days, then increase to 2 tablets (100 mg) by mouth at bedtime on 04/11/2023. 60 tablet 1   No current facility-administered medications for this visit.    Hypertensive Medication Review: Patient states that they are taking all their hypertensive medications as prescribed and their last dose of hypertensive medications was this morning   Documentation of any medication adherence discrepancies: none  Provider Recommendation:  Spoke to Gwinda Passe, NP and they stated:  to follow up as normal   Patient has been scheduled to follow up with N/A    Patient has been given provider's recommendations and does not have any questions or concerns at this time. Patient will contact the office for any future questions or concerns.

## 2023-04-08 ENCOUNTER — Other Ambulatory Visit

## 2023-04-15 ENCOUNTER — Ambulatory Visit
Admission: RE | Admit: 2023-04-15 | Discharge: 2023-04-15 | Disposition: A | Discharge status: Home / Self Care | Source: Ambulatory Visit | Attending: Primary Care | Admitting: Primary Care

## 2023-04-15 DIAGNOSIS — R928 Other abnormal and inconclusive findings on diagnostic imaging of breast: Secondary | ICD-10-CM

## 2023-04-15 DIAGNOSIS — R590 Localized enlarged lymph nodes: Secondary | ICD-10-CM | POA: Diagnosis not present

## 2023-04-29 ENCOUNTER — Ambulatory Visit (INDEPENDENT_AMBULATORY_CARE_PROVIDER_SITE_OTHER): Admitting: Student

## 2023-04-29 ENCOUNTER — Emergency Department (HOSPITAL_COMMUNITY)
Admission: EM | Admit: 2023-04-29 | Discharge: 2023-04-30 | Disposition: A | Discharge status: Other Acute Inpatient Hospital | Attending: Emergency Medicine | Admitting: Emergency Medicine

## 2023-04-29 DIAGNOSIS — F332 Major depressive disorder, recurrent severe without psychotic features: Secondary | ICD-10-CM | POA: Diagnosis not present

## 2023-04-29 DIAGNOSIS — F29 Unspecified psychosis not due to a substance or known physiological condition: Secondary | ICD-10-CM

## 2023-04-29 DIAGNOSIS — F1920 Other psychoactive substance dependence, uncomplicated: Secondary | ICD-10-CM | POA: Insufficient documentation

## 2023-04-29 DIAGNOSIS — E876 Hypokalemia: Secondary | ICD-10-CM | POA: Insufficient documentation

## 2023-04-29 DIAGNOSIS — F4312 Post-traumatic stress disorder, chronic: Secondary | ICD-10-CM

## 2023-04-29 DIAGNOSIS — R45851 Suicidal ideations: Secondary | ICD-10-CM | POA: Diagnosis not present

## 2023-04-29 DIAGNOSIS — Z79899 Other long term (current) drug therapy: Secondary | ICD-10-CM | POA: Diagnosis not present

## 2023-04-29 DIAGNOSIS — I10 Essential (primary) hypertension: Secondary | ICD-10-CM | POA: Insufficient documentation

## 2023-04-29 DIAGNOSIS — F411 Generalized anxiety disorder: Secondary | ICD-10-CM | POA: Diagnosis not present

## 2023-04-29 DIAGNOSIS — Z21 Asymptomatic human immunodeficiency virus [HIV] infection status: Secondary | ICD-10-CM | POA: Insufficient documentation

## 2023-04-29 DIAGNOSIS — J45909 Unspecified asthma, uncomplicated: Secondary | ICD-10-CM | POA: Insufficient documentation

## 2023-04-29 LAB — CBC WITH DIFFERENTIAL/PLATELET
Abs Immature Granulocytes: 0.02 10*3/uL (ref 0.00–0.07)
Basophils Absolute: 0 10*3/uL (ref 0.0–0.1)
Basophils Relative: 1 %
Eosinophils Absolute: 0.1 10*3/uL (ref 0.0–0.5)
Eosinophils Relative: 1 %
HCT: 39.9 % (ref 36.0–46.0)
Hemoglobin: 13.5 g/dL (ref 12.0–15.0)
Immature Granulocytes: 0 %
Lymphocytes Relative: 44 %
Lymphs Abs: 3.9 10*3/uL (ref 0.7–4.0)
MCH: 33.6 pg (ref 26.0–34.0)
MCHC: 33.8 g/dL (ref 30.0–36.0)
MCV: 99.3 fL (ref 80.0–100.0)
Monocytes Absolute: 0.8 10*3/uL (ref 0.1–1.0)
Monocytes Relative: 9 %
Neutro Abs: 3.9 10*3/uL (ref 1.7–7.7)
Neutrophils Relative %: 45 %
Platelets: 154 10*3/uL (ref 150–400)
RBC: 4.02 MIL/uL (ref 3.87–5.11)
RDW: 13.2 % (ref 11.5–15.5)
WBC: 8.7 10*3/uL (ref 4.0–10.5)
nRBC: 0 % (ref 0.0–0.2)

## 2023-04-29 LAB — COMPREHENSIVE METABOLIC PANEL WITH GFR
ALT: 11 U/L (ref 0–44)
AST: 29 U/L (ref 15–41)
Albumin: 4.3 g/dL (ref 3.5–5.0)
Alkaline Phosphatase: 61 U/L (ref 38–126)
Anion gap: 8 (ref 5–15)
BUN: 10 mg/dL (ref 8–23)
CO2: 25 mmol/L (ref 22–32)
Calcium: 9.5 mg/dL (ref 8.9–10.3)
Chloride: 106 mmol/L (ref 98–111)
Creatinine, Ser: 0.63 mg/dL (ref 0.44–1.00)
GFR, Estimated: >60 mL/min (ref >60)
Glucose, Bld: 109 mg/dL — ABNORMAL HIGH (ref 70–99)
Potassium: 2.8 mmol/L — ABNORMAL LOW (ref 3.5–5.1)
Sodium: 139 mmol/L (ref 135–145)
Total Bilirubin: 0.3 mg/dL (ref 0.0–1.2)
Total Protein: 8.3 g/dL — ABNORMAL HIGH (ref 6.5–8.1)

## 2023-04-29 LAB — RAPID URINE DRUG SCREEN, HOSP PERFORMED
Amphetamines: NOT DETECTED
Barbiturates: NOT DETECTED
Benzodiazepines: NOT DETECTED
Cocaine: POSITIVE — AB
Opiates: NOT DETECTED
Tetrahydrocannabinol: NOT DETECTED

## 2023-04-29 LAB — TSH: TSH: 1.183 u[IU]/mL (ref 0.350–4.500)

## 2023-04-29 LAB — ACETAMINOPHEN LEVEL: Acetaminophen (Tylenol), Serum: <10 ug/mL — ABNORMAL LOW (ref 10–30)

## 2023-04-29 LAB — ETHANOL: Alcohol, Ethyl (B): <10 mg/dL (ref <10)

## 2023-04-29 LAB — SALICYLATE LEVEL: Salicylate Lvl: <7 mg/dL — ABNORMAL LOW (ref 7.0–30.0)

## 2023-04-29 LAB — MAGNESIUM: Magnesium: 2.7 mg/dL — ABNORMAL HIGH (ref 1.7–2.4)

## 2023-04-29 MED ORDER — IRBESARTAN 300 MG PO TABS
300.0000 mg | ORAL_TABLET | Freq: Every day | ORAL | Status: DC
  Administered 2023-04-29: 300 mg via ORAL
  Filled 2023-04-29: qty 1

## 2023-04-29 MED ORDER — BICTEGRAVIR-EMTRICITAB-TENOFOV 50-200-25 MG PO TABS
1.0000 | ORAL_TABLET | Freq: Every day | ORAL | Status: DC
  Administered 2023-04-29: 1 via ORAL
  Filled 2023-04-29 (×2): qty 1

## 2023-04-29 MED ORDER — POTASSIUM CHLORIDE CRYS ER 20 MEQ PO TBCR: 40.0000 meq | EXTENDED_RELEASE_TABLET | Freq: Every day | ORAL | Status: DC

## 2023-04-29 MED ORDER — QUETIAPINE FUMARATE 100 MG PO TABS
100.0000 mg | ORAL_TABLET | Freq: Every day | ORAL | Status: DC
  Administered 2023-04-29: 100 mg via ORAL
  Filled 2023-04-29: qty 1

## 2023-04-29 MED ORDER — POTASSIUM CHLORIDE CRYS ER 20 MEQ PO TBCR
40.0000 meq | EXTENDED_RELEASE_TABLET | Freq: Once | ORAL | Status: AC
  Administered 2023-04-29: 40 meq via ORAL
  Filled 2023-04-29: qty 2

## 2023-04-29 MED ORDER — QUETIAPINE FUMARATE 50 MG PO TABS
25.0000 mg | ORAL_TABLET | Freq: Every day | ORAL | Status: DC | PRN
Start: 2023-04-29 — End: 2023-04-30

## 2023-04-29 MED ORDER — QUETIAPINE FUMARATE 50 MG PO TABS
50.0000 mg | ORAL_TABLET | Freq: Every day | ORAL | Status: DC
Start: 2023-04-29 — End: 2023-04-29

## 2023-04-29 MED ORDER — AMLODIPINE BESYLATE 5 MG PO TABS
10.0000 mg | ORAL_TABLET | Freq: Every day | ORAL | Status: DC
  Administered 2023-04-29: 10 mg via ORAL
  Filled 2023-04-29: qty 2

## 2023-04-29 NOTE — ED Provider Notes (Signed)
 [place] EMERGENCY DEPARTMENT AT [facility] Provider Note   CSN: [id number] Arrival date & time: 04/29/23  1801     History  Chief Complaint  Patient presents with   Suicidal    Daisy Salinas is a 63 y.o. female.  HPI   56 old female with medical history significant for HIV, HTN, asthma, TB, depression, bipolar disorder, GERD presenting to the Emergency Department with suicidal ideation.  The patient states that since she lost her husband a year ago she has been having thoughts of self-harm.  Her plan would be to overdose on her medications.  She has had worsening symptoms of depression.  She endorses occasional thoughts of homicide towards her children.  She would not answer when asked about audiovisual hallucinations.  She missed her home medications this morning but states that she has been compliant with her HIV medications.  Home Medications Prior to Admission medications   Medication Sig Start Date End Date Taking? Authorizing Provider  amLODipine (NORVASC) 10 MG tablet Take 1 tablet (10 mg total) by mouth daily. 03/19/23  Yes Hoy Register, MD  atorvastatin (LIPITOR) 40 MG tablet Take 1 tablet (40 mg total) by mouth daily. Patient taking differently: Take 40 mg by mouth at bedtime. 03/04/23 02/27/24 Yes Vu, Tonita Phoenix, MD  bictegravir-emtricitabine-tenofovir AF (BIKTARVY) 50-200-25 MG TABS tablet Take 1 tablet by mouth daily. 02/26/23  Yes Danelle Earthly, MD  meloxicam (MOBIC) 15 MG tablet One tab PO qAM with breakfast for 2 weeks, then daily prn pain. 10/14/22  Yes Rodolph Bong, MD  olmesartan (BENICAR) 40 MG tablet Take 1 tablet (40 mg total) by mouth daily. 03/19/23  Yes Hoy Register, MD  QUEtiapine (SEROQUEL) 25 MG tablet Take 1 tablet (25 mg total) by mouth daily as needed (anxiety). Patient taking differently: Take 25 mg by mouth at bedtime. 03/27/23 05/26/23 Yes Lamar Sprinkles, MD  QUEtiapine (SEROQUEL) 50 MG tablet Take 1-2 tablets (50-100 mg total) by mouth at  bedtime. Take 1 tablet (50 mg) by mouth at bedtime x 15 days, then increase to 2 tablets (100 mg) by mouth at bedtime on 04/11/2023. Patient taking differently: Take 100 mg by mouth at bedtime. 03/27/23 05/26/23 Yes Lamar Sprinkles, MD      Allergies    Patient has no known allergies.    Review of Systems   Review of Systems  Psychiatric/Behavioral:  Positive for suicidal ideas.   All other systems reviewed and are negative.   Physical Exam Updated Vital Signs BP (!) 157/98   Pulse 69   Temp 98.8 F (37.1 C) (Oral)   Resp 18   Ht 4\' 11"  (1.499 m)   Wt 59.1 kg   SpO2 98%   BMI 26.32 kg/m  Physical Exam Vitals and nursing note reviewed.  Constitutional:      General: She is in acute distress.     Appearance: She is well-developed.     Comments: Tearful in emotional distress  HENT:     Head: Normocephalic and atraumatic.  Eyes:     Conjunctiva/sclera: Conjunctivae normal.  Cardiovascular:     Rate and Rhythm: Normal rate and regular rhythm.  Pulmonary:     Effort: Pulmonary effort is normal. No respiratory distress.     Breath sounds: Normal breath sounds.  Abdominal:     Palpations: Abdomen is soft.     Tenderness: There is no abdominal tenderness.  Musculoskeletal:        General: No swelling.     Cervical  back: Neck supple.  Skin:    General: Skin is warm and dry.     Capillary Refill: Capillary refill takes less than 2 seconds.  Neurological:     Mental Status: She is alert.  Psychiatric:        Attention and Perception: Attention and perception normal.        Mood and Affect: Mood is depressed. Affect is tearful.        Speech: Speech normal.        Behavior: Behavior is withdrawn. Behavior is cooperative.        Thought Content: Thought content includes suicidal ideation. Thought content includes suicidal plan.     ED Results / Procedures / Treatments   Labs (all labs ordered are listed, but only abnormal results are displayed) Labs Reviewed   COMPREHENSIVE METABOLIC PANEL WITH GFR - Abnormal; Notable for the following components:      Result Value   Potassium 2.8 (*)    Glucose, Bld 109 (*)    Total Protein 8.3 (*)    All other components within normal limits  SALICYLATE LEVEL - Abnormal; Notable for the following components:   Salicylate Lvl <7.0 (*)    All other components within normal limits  ACETAMINOPHEN LEVEL - Abnormal; Notable for the following components:   Acetaminophen (Tylenol), Serum <10 (*)    All other components within normal limits  ETHANOL  CBC WITH DIFFERENTIAL/PLATELET  TSH  RAPID URINE DRUG SCREEN, HOSP PERFORMED  T-HELPER CELLS (CD4) COUNT (NOT AT [facility])  MAGNESIUM  BASIC METABOLIC PANEL WITH GFR    EKG None  Radiology No results found.  Procedures Procedures    Medications Ordered in ED Medications  amLODipine (NORVASC) tablet 10 mg (has no administration in time range)  bictegravir-emtricitabine-tenofovir AF (BIKTARVY) 50-200-25 MG per tablet 1 tablet (has no administration in time range)  irbesartan (AVAPRO) tablet 300 mg (has no administration in time range)  QUEtiapine (SEROQUEL) tablet 50-100 mg (has no administration in time range)  QUEtiapine (SEROQUEL) tablet 25 mg (has no administration in time range)  potassium chloride SA (KLOR-CON M) CR tablet 40 mEq (has no administration in time range)  potassium chloride SA (KLOR-CON M) CR tablet 40 mEq (has no administration in time range)    ED Course/ Medical Decision Making/ A&P                                 Medical Decision Making Amount and/or Complexity of Data Reviewed Labs: ordered.  Risk Prescription drug management.    79 old female with medical history significant for HIV, HTN, asthma, TB, depression, bipolar disorder, GERD presenting to the Emergency Department with suicidal ideation.  The patient states that since she lost her husband a year ago she has been having thoughts of self-harm.  Her plan would be to  overdose on her medications.  She has had worsening symptoms of depression.  She endorses occasional thoughts of homicide towards her children.  She would not answer when asked about audiovisual hallucinations.  She missed her home medications this morning but states that she has been compliant with her HIV medications.  On arrival, the patient was vitally stable.  She was in emotional distress, depressed with tearful affect, withdrawn, suicidal actively with the plan.  She initially presented voluntarily however subsequently abruptly wanted to leave and was subsequently placed under involuntary commitment.  Her plan would be to overdose on her  home medications.  Given her history of HIV, screening labs in addition to CD4 count was ordered.  Patient denies any medical complaints at this time.  Screening laboratory evaluation significant for hypokalemia to 2.8, replenished orally, recheck of BMP ordered for tomorrow and daily potassium ordered for the next 3 days.  Remainder of laboratory evaluation overall reassuring.  Magnesium ordered to be checked and resulted mildly elevated.  With potassium supplementation, patient is medically cleared for TTS consultation.   Final Clinical Impression(s) / ED Diagnoses Final diagnoses:  Suicidal ideation  Hypokalemia    Rx / DC Orders ED Discharge Orders     None         Ernie Avena, MD 04/29/23 2240
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
## 2023-04-29 NOTE — ED Notes (Signed)
 Patient requesting her personal belongings to be able to take her home medications. Patient educated on the process here in the hospital when home medications are prescribed. Primary nurse notified about patient request.

## 2023-04-29 NOTE — ED Notes (Signed)
 Patient crying in the hallway stating she wants to go home. Primary nurse notified.

## 2023-04-29 NOTE — ED Triage Notes (Signed)
 Pt brought in by crisis team. Called from [facility] for patient having S.I. denies any attempt or H.I. Lost her husband a year ago. Also dealing with significant back pain, no new injury. Pt calm and cooperative at this time. Not IVC, voluntary

## 2023-04-30 ENCOUNTER — Other Ambulatory Visit: Payer: Self-pay

## 2023-04-30 ENCOUNTER — Inpatient Hospital Stay
Admission: AD | Admit: 2023-04-30 | Discharge: 2023-05-05 | DRG: 885 | Disposition: A | Discharge status: Home / Self Care | Source: Intra-hospital | Attending: Psychiatry | Admitting: Psychiatry

## 2023-04-30 ENCOUNTER — Encounter: Payer: Self-pay | Admitting: Nurse Practitioner

## 2023-04-30 DIAGNOSIS — F10239 Alcohol dependence with withdrawal, unspecified: Secondary | ICD-10-CM | POA: Diagnosis present

## 2023-04-30 DIAGNOSIS — F17210 Nicotine dependence, cigarettes, uncomplicated: Secondary | ICD-10-CM | POA: Diagnosis present

## 2023-04-30 DIAGNOSIS — F333 Major depressive disorder, recurrent, severe with psychotic symptoms: Secondary | ICD-10-CM | POA: Diagnosis not present

## 2023-04-30 DIAGNOSIS — F411 Generalized anxiety disorder: Secondary | ICD-10-CM | POA: Diagnosis present

## 2023-04-30 DIAGNOSIS — Z560 Unemployment, unspecified: Secondary | ICD-10-CM | POA: Diagnosis not present

## 2023-04-30 DIAGNOSIS — Z62810 Personal history of physical and sexual abuse in childhood: Secondary | ICD-10-CM | POA: Diagnosis not present

## 2023-04-30 DIAGNOSIS — R442 Other hallucinations: Secondary | ICD-10-CM | POA: Diagnosis present

## 2023-04-30 DIAGNOSIS — Z21 Asymptomatic human immunodeficiency virus [HIV] infection status: Secondary | ICD-10-CM | POA: Diagnosis present

## 2023-04-30 DIAGNOSIS — Z8249 Family history of ischemic heart disease and other diseases of the circulatory system: Secondary | ICD-10-CM | POA: Diagnosis not present

## 2023-04-30 DIAGNOSIS — K219 Gastro-esophageal reflux disease without esophagitis: Secondary | ICD-10-CM | POA: Diagnosis present

## 2023-04-30 DIAGNOSIS — Z79899 Other long term (current) drug therapy: Secondary | ICD-10-CM | POA: Diagnosis not present

## 2023-04-30 DIAGNOSIS — F332 Major depressive disorder, recurrent severe without psychotic features: Principal | ICD-10-CM | POA: Diagnosis present

## 2023-04-30 DIAGNOSIS — R45851 Suicidal ideations: Secondary | ICD-10-CM | POA: Diagnosis present

## 2023-04-30 DIAGNOSIS — Z818 Family history of other mental and behavioral disorders: Secondary | ICD-10-CM | POA: Diagnosis not present

## 2023-04-30 DIAGNOSIS — I10 Essential (primary) hypertension: Secondary | ICD-10-CM | POA: Diagnosis present

## 2023-04-30 LAB — RESP PANEL BY RT-PCR (RSV, FLU A&B, COVID)  RVPGX2
Influenza A by PCR: NEGATIVE
Influenza B by PCR: NEGATIVE
Resp Syncytial Virus by PCR: NEGATIVE
SARS Coronavirus 2 by RT PCR: NEGATIVE

## 2023-04-30 LAB — BASIC METABOLIC PANEL WITH GFR
Anion gap: 9 (ref 5–15)
BUN: 12 mg/dL (ref 8–23)
CO2: 25 mmol/L (ref 22–32)
Calcium: 9.3 mg/dL (ref 8.9–10.3)
Chloride: 105 mmol/L (ref 98–111)
Creatinine, Ser: 0.64 mg/dL (ref 0.44–1.00)
GFR, Estimated: >60 mL/min (ref >60)
Glucose, Bld: 114 mg/dL — ABNORMAL HIGH (ref 70–99)
Potassium: 3.1 mmol/L — ABNORMAL LOW (ref 3.5–5.1)
Sodium: 139 mmol/L (ref 135–145)

## 2023-04-30 LAB — T-HELPER CELLS (CD4) COUNT (NOT AT ARMC)
CD4 % Helper T Cell: 46 % (ref 33–65)
CD4 T Cell Abs: 1762 /uL (ref 400–1790)

## 2023-04-30 MED ORDER — ALUM & MAG HYDROXIDE-SIMETH 200-200-20 MG/5ML PO SUSP: 15.0000 mL | Freq: Four times a day (QID) | ORAL | Status: DC | PRN

## 2023-04-30 MED ORDER — ATORVASTATIN CALCIUM 10 MG PO TABS
40.0000 mg | ORAL_TABLET | Freq: Every day | ORAL | Status: DC
  Administered 2023-04-30 – 2023-05-04 (×5): 40 mg via ORAL
  Filled 2023-04-30 (×6): qty 4

## 2023-04-30 MED ORDER — IRBESARTAN 150 MG PO TABS
300.0000 mg | ORAL_TABLET | Freq: Every day | ORAL | Status: DC
  Administered 2023-05-01 – 2023-05-05 (×5): 300 mg via ORAL
  Filled 2023-04-30 (×5): qty 2

## 2023-04-30 MED ORDER — QUETIAPINE FUMARATE 25 MG PO TABS: 12.5000 mg | ORAL_TABLET | Freq: Every day | ORAL | 1 refills | Status: DC | PRN

## 2023-04-30 MED ORDER — BICTEGRAVIR-EMTRICITAB-TENOFOV 50-200-25 MG PO TABS
1.0000 | ORAL_TABLET | Freq: Every day | ORAL | Status: DC
  Administered 2023-05-01 – 2023-05-05 (×5): 1 via ORAL
  Filled 2023-04-30 (×6): qty 1

## 2023-04-30 MED ORDER — MAGNESIUM HYDROXIDE 400 MG/5ML PO SUSP: 15.0000 mL | Freq: Every day | ORAL | Status: DC | PRN

## 2023-04-30 MED ORDER — OLANZAPINE 5 MG PO TBDP: 5.0000 mg | ORAL_TABLET | Freq: Three times a day (TID) | ORAL | Status: DC | PRN

## 2023-04-30 MED ORDER — POTASSIUM CHLORIDE CRYS ER 20 MEQ PO TBCR
80.0000 meq | EXTENDED_RELEASE_TABLET | Freq: Once | ORAL | Status: AC
  Administered 2023-04-30: 80 meq via ORAL
  Filled 2023-04-30: qty 4

## 2023-04-30 MED ORDER — ALUM & MAG HYDROXIDE-SIMETH 200-200-20 MG/5ML PO SUSP
30.0000 mL | Freq: Once | ORAL | Status: AC
  Administered 2023-04-30: 30 mL via ORAL
  Filled 2023-04-30: qty 30

## 2023-04-30 MED ORDER — ACETAMINOPHEN 325 MG PO TABS
650.0000 mg | ORAL_TABLET | Freq: Four times a day (QID) | ORAL | Status: DC | PRN
  Administered 2023-05-01 – 2023-05-04 (×5): 650 mg via ORAL
  Filled 2023-04-30 (×6): qty 2

## 2023-04-30 MED ORDER — DULOXETINE HCL 30 MG PO CPEP: 30.0000 mg | ORAL_CAPSULE | Freq: Every day | ORAL | 1 refills | Status: DC

## 2023-04-30 MED ORDER — QUETIAPINE FUMARATE 100 MG PO TABS
100.0000 mg | ORAL_TABLET | Freq: Every day | ORAL | 1 refills | Status: DC
Start: 2023-04-30 — End: 2023-07-22

## 2023-04-30 MED ORDER — NICOTINE POLACRILEX 2 MG MT GUM: 2.0000 mg | CHEWING_GUM | OROMUCOSAL | Status: DC | PRN

## 2023-04-30 MED ORDER — QUETIAPINE FUMARATE 25 MG PO TABS: 25.0000 mg | ORAL_TABLET | Freq: Every day | ORAL | Status: DC | PRN

## 2023-04-30 MED ORDER — OLANZAPINE 10 MG IM SOLR: 5.0000 mg | Freq: Three times a day (TID) | INTRAMUSCULAR | Status: DC | PRN

## 2023-04-30 MED ORDER — AMLODIPINE BESYLATE 5 MG PO TABS
10.0000 mg | ORAL_TABLET | Freq: Every day | ORAL | Status: DC
  Administered 2023-04-30 – 2023-05-04 (×5): 10 mg via ORAL
  Filled 2023-04-30 (×5): qty 2

## 2023-04-30 MED ORDER — QUETIAPINE FUMARATE 100 MG PO TABS
100.0000 mg | ORAL_TABLET | Freq: Every day | ORAL | Status: DC
Start: 2023-04-30 — End: 2023-05-05
  Administered 2023-04-30 – 2023-05-04 (×5): 100 mg via ORAL
  Filled 2023-04-30 (×5): qty 1

## 2023-04-30 NOTE — ED Notes (Signed)
 Report given to [facility]

## 2023-04-30 NOTE — ED Notes (Addendum)
 Pt accepted to [facility]-Psych [phone] for report.  [facility] is accepting. Marval Regal Md is the attending.  [facility] department for Trx when available d/t IVC.  Please discharge pt from ED following Trx departure.

## 2023-04-30 NOTE — ED Provider Notes (Addendum)
 Emergency Medicine Observation Re-evaluation Note  Daisy Salinas is a 63 y.o. female, seen on rounds today.  Pt initially presented to the ED for complaints of Suicidal Currently, the patient is IVC'd and accepted to inpatient.  Physical Exam  BP 138/69 (BP Location: Right Arm)   Pulse (!) 59   Temp 97.8 F (36.6 C)   Resp 18   Ht 1.499 m (4\' 11" )   Wt 59.1 kg   SpO2 97%   BMI 26.32 kg/m  Physical Exam General: nad Cardiac: rrr, bp 138/69 Lungs: no distress, sats 97% Psych: tearful on exam  ED Course / MDM  EKG:   I have reviewed the labs performed to date as well as medications administered while in observation.  Recent changes in the last 24 hours include patient evaluated, hypokalemia noted and repleted with improved but still low lever, now at 3/1.  Plan  Current plan is for patient to got [facility] and is accepted per RN by Daisy Salinas.Daisy Grizzle, MD 04/30/23 [id number]    Daisy Grizzle, MD 04/30/23 [phone]

## 2023-04-30 NOTE — ED Notes (Signed)
 Tele psych on for patient.

## 2023-04-30 NOTE — ED Notes (Signed)
Pt transported

## 2023-04-30 NOTE — ED Notes (Signed)
 Called [facility] department for Trx to [facility], no answer x2, left voicemail w/ callback number. Will notify dayshift.

## 2023-04-30 NOTE — Progress Notes (Signed)
   04/30/23 2300  Psych Admission Type (Psych Patients Only)  Admission Status Involuntary  Psychosocial Assessment  Patient Complaints Crying spells;Anxiety;Depression  Eye Contact Fair  Facial Expression Animated;Sad  Affect Depressed  Speech Logical/coherent  Interaction Assertive  Motor Activity Slow  Appearance/Hygiene In scrubs  Behavior Characteristics Cooperative  Mood Depressed;Anxious;Pleasant  Aggressive Behavior  Effect No apparent injury  Thought Process  Coherency WDL  Content Preoccupation  Delusions None reported or observed  Perception WDL  Hallucination None reported or observed  Judgment Impaired  Confusion None  Danger to Self  Current suicidal ideation? Denies  Danger to Others  Danger to Others None reported or observed

## 2023-04-30 NOTE — Progress Notes (Signed)
   04/30/23 1210  Psych Admission Type (Psych Patients Only)  Admission Status Involuntary  Psychosocial Assessment  Patient Complaints Depression;Sadness  Eye Contact Brief  Facial Expression Sad  Affect Sad;Depressed  Speech Logical/coherent  Interaction Minimal  Motor Activity Slow  Appearance/Hygiene In scrubs  Mood Depressed;Sad  Thought Process  Coherency WDL  Content WDL  Delusions None reported or observed  Perception WDL  Hallucination None reported or observed  Judgment Impaired  Confusion WDL  Danger to Self  Current suicidal ideation? Denies  Danger to Others  Danger to Others None reported or observed   Alert and oriented x3 (person, place, time). No evidence of cognitive impairment. Good insight into current emotional state and substance use; judgment appears intact. Cooperative, engaged in conversation. Depressed (7/10 on the depression scale). Denies anxiety. Patient reports feeling persistently sad since the death of her husband last year. She expresses emotional distress and a sense of loss. Patient lives alone but is supported by her daughter and granddaughter. Current smoker (one pack per day for the past 3 days). Also reports using crack cocaine. Alcohol consumption is reported as occasional (every 3 weeks). Denies pain or physical discomfort. Works full-time, requesting to call her workplace after the assessment to notify them of her admission. Denies a history of physical, sexual, or verbal abuse. Has a primary care provider (PCP) whom she sees when scheduled and reports no difficulty affording medications. Patient expresses a goal to "find where I belong," suggesting an emotional desire for connection, support, and meaning.
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
## 2023-04-30 NOTE — Plan of Care (Signed)
  Problem: Education: Goal: Utilization of techniques to improve thought processes will improve Outcome: Progressing Goal: Knowledge of the prescribed therapeutic regimen will improve Outcome: Progressing   Problem: Activity: Goal: Interest or engagement in leisure activities will improve Outcome: Progressing Goal: Imbalance in normal sleep/wake cycle will improve Outcome: Progressing   

## 2023-05-01 ENCOUNTER — Encounter: Payer: Self-pay | Admitting: Nurse Practitioner

## 2023-05-01 DIAGNOSIS — F333 Major depressive disorder, recurrent, severe with psychotic symptoms: Secondary | ICD-10-CM

## 2023-05-01 MED ORDER — HYDROXYZINE HCL 50 MG PO TABS
50.0000 mg | ORAL_TABLET | Freq: Four times a day (QID) | ORAL | Status: DC | PRN
  Administered 2023-05-01 – 2023-05-04 (×7): 50 mg via ORAL
  Filled 2023-05-01 (×7): qty 1

## 2023-05-01 MED ORDER — HYDROXYZINE HCL 25 MG PO TABS
25.0000 mg | ORAL_TABLET | Freq: Four times a day (QID) | ORAL | Status: DC | PRN
  Administered 2023-05-01: 25 mg via ORAL
  Filled 2023-05-01: qty 1

## 2023-05-01 MED ORDER — THIAMINE HCL 100 MG/ML IJ SOLN
100.0000 mg | Freq: Every day | INTRAMUSCULAR | Status: DC
  Filled 2023-05-01: qty 2

## 2023-05-01 MED ORDER — FOLIC ACID 1 MG PO TABS
1.0000 mg | ORAL_TABLET | Freq: Every day | ORAL | Status: DC
  Administered 2023-05-01 – 2023-05-05 (×5): 1 mg via ORAL
  Filled 2023-05-01 (×5): qty 1

## 2023-05-01 MED ORDER — THIAMINE MONONITRATE 100 MG PO TABS
100.0000 mg | ORAL_TABLET | Freq: Every day | ORAL | Status: DC
  Administered 2023-05-01 – 2023-05-05 (×5): 100 mg via ORAL
  Filled 2023-05-01 (×5): qty 1

## 2023-05-01 MED ORDER — DULOXETINE HCL 30 MG PO CPEP
30.0000 mg | ORAL_CAPSULE | Freq: Every day | ORAL | Status: DC
  Administered 2023-05-01 – 2023-05-02 (×2): 30 mg via ORAL
  Filled 2023-05-01 (×2): qty 1

## 2023-05-01 MED ORDER — LORAZEPAM 2 MG/ML IJ SOLN: 1.0000 mg | INTRAMUSCULAR | Status: AC | PRN

## 2023-05-01 MED ORDER — LORAZEPAM 1 MG PO TABS
1.0000 mg | ORAL_TABLET | ORAL | Status: AC | PRN
  Administered 2023-05-02: 2 mg via ORAL
  Filled 2023-05-01: qty 2

## 2023-05-01 MED ORDER — ADULT MULTIVITAMIN W/MINERALS CH
1.0000 | ORAL_TABLET | Freq: Every day | ORAL | Status: DC
  Administered 2023-05-01 – 2023-05-05 (×5): 1 via ORAL
  Filled 2023-05-01 (×5): qty 1

## 2023-05-01 NOTE — Progress Notes (Signed)
   05/01/23 0600  15 Minute Checks  Location Bedroom  Visual Appearance Calm  Behavior Sleeping  Sleep (Behavioral Health Patients Only)  Calculate sleep? (Click Yes once per 24 hr at 0600 safety check) Yes  Documented sleep last 24 hours 6

## 2023-05-01 NOTE — Group Note (Signed)
 Date:  05/01/2023 Time:  10:23 PM  Group Topic/Focus:  Managing Feelings:   The focus of this group is to identify what feelings patients have difficulty handling and develop a plan to handle them in a healthier way upon discharge.    Participation Level:  Active  Participation Quality:  Appropriate  Affect:  Appropriate  Cognitive:  Appropriate  Insight: Appropriate  Engagement in Group:  Engaged  Modes of Intervention:  Discussion  Additional Comments:    Garry Heater 05/01/2023, 10:23 PM

## 2023-05-01 NOTE — Plan of Care (Signed)
 D: Pt alert and oriented. Pt reports experiencing anxiety and depression at this time. Pt reports experiencing 8/10 left leg pain, prn medication was given. Pt denies experiencing any SI/HI, or AVH at this time.   A: Scheduled medications administered to pt, per MD orders. Support and encouragement provided. Frequent verbal contact made. Routine safety checks conducted q15 minutes.   R: No adverse drug reactions noted. Pt verbally contracts for safety at this time. Pt compliant with medications and treatment plan. Pt interacts well with others on the unit. Pt remains safe at this time. Plan of care ongoing.  Pt has been present and eating meals. Pt is observed as present and active in the milieu. Pt observed interacting with other pts.   Problem: Activity: Goal: Interest or engagement in leisure activities will improve Outcome: Progressing   Problem: Coping: Goal: Will verbalize feelings Outcome: Progressing
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
## 2023-05-01 NOTE — [facility] Suicide Risk Assessment (Signed)
 [facility] Admission Suicide Risk Assessment   Nursing information obtained from:  Patient Demographic factors:  Divorced or widowed, Living alone Current Mental Status:  NA Loss Factors:  NA Historical Factors:  NA Risk Reduction Factors:  Positive social support  Total Time spent with patient: 1 hour Principal Problem: MDD (major depressive disorder), recurrent episode, severe (HCC) Diagnosis:  Principal Problem:   MDD (major depressive disorder), recurrent episode, severe (HCC)  Subjective Data: Pt was brought from her home to [facility]. Pt said that she was "tired of living." She says that "I can't get it right.' She lives with her daughter and her family in her home and has to pay rent. Pt husband died Jun 03, 2019. She said she would overdose on medications. She wants to get an apartment of her own but her credit is bad. Patient has chronic health problems and back pain. She denies any HI. She has some tactile hallucinations when she lays down "I can feel something inside the bed." Patient last used cocaine (snorting) two days ago. Pt also purchases percocet from a neighbor occasionally. Pt says she last used ETOH on Saturday (04/05) and drank 4oz of tequila. Patient is admitted to [facility] unit with Q15 min safety monitoring. Multidisciplinary team approach is offered. Medication management; group/milieu therapy is offered.   Continued Clinical Symptoms:  Alcohol Use Disorder Identification Test Final Score (AUDIT): 4 The "Alcohol Use Disorders Identification Test", Guidelines for Use in Primary Care, Second Edition.  World Science writer [facility]). Score between 0-7:  no or low risk or alcohol related problems. Score between 8-15:  moderate risk of alcohol related problems. Score between 16-19:  high risk of alcohol related problems. Score 20 or above:  warrants further diagnostic evaluation for alcohol dependence and treatment.   CLINICAL FACTORS:   Severe Anxiety and/or  Agitation Depression:   Hopelessness Impulsivity Alcohol/Substance Abuse/Dependencies    Psychiatric Specialty Exam:  Presentation  General Appearance: Fairly groomed, stated age Eye Contact: Fair eye contact Speech: Normal Speech Volume: Normal Handedness: Right  Mood and Affect  Mood: Depressed Affect: Labile  Thought Process  Thought Processes: Linear Descriptions of Associations: None reported Orientation: Oriented x 4 Thought Content: Coherent Hallucinations: Denies Ideas of Reference: Denies Suicidal Thoughts: Passive suicidal thoughts with no specific plan Homicidal Thoughts: Denies  Sensorium  Memory: Normal Judgment: Impaired Insight: Impaired  Art therapist  Concentration: Fair Attention Span: Fair Recall: [employer] of Knowledge: Fair Language: Normal  Psychomotor Activity  Psychomotor Activity: Normal Musculoskeletal: Strength & Muscle Tone: Normal Gait & Station: Steady  Assets  Assets: Spirituality  Sleep  Sleep: Fair   Physical Exam: Physical Exam ROS Blood pressure (!) 147/75, pulse 62, temperature 98.3 F (36.8 C), resp. rate 20, height 4\' 11"  (1.499 m), weight 57.6 kg, SpO2 99%. Body mass index is 25.65 kg/m.   COGNITIVE FEATURES THAT CONTRIBUTE TO RISK:  None    SUICIDE RISK:   Severe:  Frequent, intense, and enduring suicidal ideation, specific plan, no subjective intent, but some objective markers of intent (i.e., choice of lethal method), the method is accessible, some limited preparatory behavior, evidence of impaired self-control, severe dysphoria/symptomatology, multiple risk factors present, and few if any protective factors, particularly a lack of social support.  PLAN OF CARE: Patient is admitted to [facility] psych unit with Q15 min safety monitoring. Multidisciplinary team approach is offered. Medication management; group/milieu therapy is offered.   I certify that inpatient services furnished can reasonably be  expected to improve the patient's  condition.   Verner Chol, MD 05/01/2023, 12:13 PM
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
## 2023-05-01 NOTE — Progress Notes (Signed)
   05/01/23 2200  Psych Admission Type (Psych Patients Only)  Admission Status Involuntary  Psychosocial Assessment  Patient Complaints Anxiety;Depression  Eye Contact Fair  Facial Expression Sad  Affect Depressed  Speech Logical/coherent  Interaction Assertive  Motor Activity Slow  Appearance/Hygiene In scrubs  Behavior Characteristics Cooperative  Mood Depressed;Anxious;Pleasant  Thought Process  Coherency WDL  Content Preoccupation  Delusions None reported or observed  Perception WDL  Hallucination None reported or observed  Judgment Impaired  Confusion None  Danger to Self  Current suicidal ideation? Denies  Danger to Others  Danger to Others None reported or observed

## 2023-05-01 NOTE — Group Note (Signed)
 Recreation Therapy Group Note   Group Topic:Communication  Group Date: 05/01/2023 Start Time: 1400 End Time: 1500 Facilitators: Rosina Lowenstein, LRT, CTRS Location: Courtyard  Group Description: Trivia. LRT reads off trivia question for patients to hear while also giving them a copy of the trivia questions. Pts are given a set time limit to answer the question correctly. LRT waits for all patients to make a guess out loud. LRT facilitated post-game discussion on the importance of working well with others, active listening, and communication. LRT and pts discussed how this can apply to life post-discharge.   Goal Area(s) Addressed: Patient will increase communication skills.  Patient will increase frustration tolerance skills. Patient will practice active listening.    Affect/Mood: Appropriate   Participation Level: Non-verbal    Clinical Observations/Individualized Feedback: Daisy Salinas was present in group for 5 minutes.  Plan: Continue to engage patient in RT group sessions 2-3x/week.   Rosina Lowenstein, LRT, CTRS 05/01/2023 4:54 PM

## 2023-05-02 DIAGNOSIS — F333 Major depressive disorder, recurrent, severe with psychotic symptoms: Secondary | ICD-10-CM | POA: Diagnosis not present

## 2023-05-02 MED ORDER — DULOXETINE HCL 60 MG PO CPEP
60.0000 mg | ORAL_CAPSULE | Freq: Every day | ORAL | Status: DC
  Administered 2023-05-03 – 2023-05-05 (×3): 60 mg via ORAL
  Filled 2023-05-02 (×3): qty 1

## 2023-05-02 NOTE — Progress Notes (Addendum)
 [facility] MD Progress Note  05/02/2023 2:11 PM Daisy Salinas  MRN:  [id number]  Pt was brought from her home to [facility]. Pt said that she was "tired of living." She says that "I can't get it right.' She lives with her daughter and her family in her home and has to pay rent. Pt husband died 2019/05/06. She said she would overdose on medications. She wants to get an apartment of her own but her credit is bad. Patient has chronic health problems and back pain. She denies any HI. She has some tactile hallucinations when she lays down "I can feel something inside the bed." Patient last used cocaine (snorting) two days ago. Pt also purchases percocet from a neighbor occasionally. Pt says she last used ETOH on Saturday (04/05) and drank 4oz of tequila. Patient is admitted to [facility] unit with Q15 min safety monitoring. Multidisciplinary team approach is offered. Medication management; group/milieu therapy is offered.   Per nursing she scored CIWA 13 today.   Subjective:  Chart reviewed, case discussed in multidisciplinary meeting, patient seen during rounds. Today on interview patient reports feeling "fine". She reports poor sleep as she kept thinking about her house and Job. She reports that yesterday was her husband' death anniversary an she dragged the day and survived it. She denies active SI/Hi/plan but remains overwhelmed about losing her husband and having conflicts at home with her daughter. She reports having appointments with her psychiatrist next week. She states that Cymbalta is helping her with her pain and tolerating it well. Provider discussed the plan to increase her Cymbalta to 60 mg daily tomorrow and she agreed with the plan.She denies auditory/visual hallucinations.   Sleep: Poor  Appetite:  Poor  Past Psychiatric History: see h&P Family History:  Family History  Problem Relation Age of Onset   Coronary artery disease Mother    Leukemia Father    Social History:  Social History    Substance and Sexual Activity  Alcohol Use Yes   Alcohol/week: 2.0 standard drinks of alcohol   Types: 1 Cans of beer, 1 Standard drinks or equivalent per week   Comment: occ     Social History   Substance and Sexual Activity  Drug Use Yes   Frequency: 1.0 times per week   Types: Cocaine   Comment: Occassional use    Social History   Socioeconomic History   Marital status: Widowed    Spouse name: Not on file   Number of children: Not on file   Years of education: Not on file   Highest education level: Not on file  Occupational History   Occupation: unemployed  Tobacco Use   Smoking status: Every Day    Current packs/day: 0.70    Average packs/day: 0.7 packs/day for 45.0 years (31.5 ttl pk-yrs)    Types: Cigarettes   Smokeless tobacco: Never   Tobacco comments:    wanting "some cinnamon" to help her stop  Vaping Use   Vaping status: Never Used  Substance and Sexual Activity   Alcohol use: Yes    Alcohol/week: 2.0 standard drinks of alcohol    Types: 1 Cans of beer, 1 Standard drinks or equivalent per week    Comment: occ   Drug use: Yes    Frequency: 1.0 times per week    Types: Cocaine    Comment: Occassional use   Sexual activity: Not Currently    Partners: Male    Comment: declined condoms  Other Topics Concern  Not on file  Social History Narrative   Not on file   Social Drivers of Health   Financial Resource Strain: Not on file  Food Insecurity: Patient Declined (04/30/2023)   Hunger Vital Sign    Worried About Running Out of Food in the Last Year: Patient declined    Ran Out of Food in the Last Year: Patient declined  Transportation Needs: No Transportation Needs (04/30/2023)   PRAPARE - Administrator, Civil Service (Medical): No    Lack of Transportation (Non-Medical): No  Physical Activity: Not on file  Stress: Not on file  Social Connections: Moderately Isolated (04/30/2023)   Social Connection and Isolation Panel [NHANES]     Frequency of Communication with Friends and Family: More than three times a week    Frequency of Social Gatherings with Friends and Family: More than three times a week    Attends Religious Services: 1 to 4 times per year    Active Member of [employer] or Organizations: No    Attends Banker Meetings: Never    Marital Status: Widowed   Past Medical History:  Past Medical History:  Diagnosis Date   Asthma    Bipolar 1 disorder (HCC) 2009   Depression    GERD (gastroesophageal reflux disease)    HIV (human immunodeficiency virus infection) (HCC)    Hypertension    TB (pulmonary tuberculosis) 1989   was exposed and treated   UTI (urinary tract infection) 1997    Past Surgical History:  Procedure Laterality Date   ABDOMINAL HYSTERECTOMY      Current Medications: Current Facility-Administered Medications  Medication Dose Route Frequency Provider Last Rate Last Admin   acetaminophen (TYLENOL) tablet 650 mg  650 mg Oral Q6H PRN Onuoha, Chinwendu V, NP   650 mg at 05/01/23 0900   alum & mag hydroxide-simeth (MAALOX/MYLANTA) 200-200-20 MG/5ML suspension 15 mL  15 mL Oral Q6H PRN Onuoha, Chinwendu V, NP       amLODipine (NORVASC) tablet 10 mg  10 mg Oral QHS McLauchlin, Angela, NP   10 mg at 05/01/23 2113   atorvastatin (LIPITOR) tablet 40 mg  40 mg Oral QHS McLauchlin, Angela, NP   40 mg at 05/01/23 2113   bictegravir-emtricitabine-tenofovir AF (BIKTARVY) 50-200-25 MG per tablet 1 tablet  1 tablet Oral Daily McLauchlin, Angela, NP   1 tablet at 05/02/23 1121   DULoxetine (CYMBALTA) DR capsule 30 mg  30 mg Oral Daily Verner Chol, MD   30 mg at 05/02/23 1115   folic acid (FOLVITE) tablet 1 mg  1 mg Oral Daily Verner Chol, MD   1 mg at 05/02/23 1115   hydrOXYzine (ATARAX) tablet 50 mg  50 mg Oral Q6H PRN Verner Chol, MD   50 mg at 05/01/23 1736   irbesartan (AVAPRO) tablet 300 mg  300 mg Oral Daily McLauchlin, Angela, NP   300 mg at 05/02/23 1121   LORazepam (ATIVAN)  tablet 1-4 mg  1-4 mg Oral Q1H PRN Verner Chol, MD   2 mg at 05/02/23 1235   Or   LORazepam (ATIVAN) injection 1-4 mg  1-4 mg Intravenous Q1H PRN Verner Chol, MD       magnesium hydroxide (MILK OF MAGNESIA) suspension 15 mL  15 mL Oral Daily PRN Onuoha, Chinwendu V, NP       multivitamin with minerals tablet 1 tablet  1 tablet Oral Daily Verner Chol, MD   1 tablet at 05/02/23 1114   nicotine polacrilex (NICORETTE) gum  2 mg  2 mg Oral PRN Verner Chol, MD       OLANZapine (ZYPREXA) injection 5 mg  5 mg Intramuscular TID PRN Onuoha, Chinwendu V, NP       OLANZapine zydis (ZYPREXA) disintegrating tablet 5 mg  5 mg Oral TID PRN Onuoha, Chinwendu V, NP       QUEtiapine (SEROQUEL) tablet 100 mg  100 mg Oral QHS McLauchlin, Angela, NP   100 mg at 05/01/23 2113   thiamine (VITAMIN B1) tablet 100 mg  100 mg Oral Daily Verner Chol, MD   100 mg at 05/02/23 1115   Or   thiamine (VITAMIN B1) injection 100 mg  100 mg Intravenous Daily Verner Chol, MD        Lab Results: No results found for this or any previous visit (from the past 48 hours).  Blood Alcohol level:  Lab Results  Component Value Date   ETH <10 04/29/2023   ETH <10 04/05/2020    Metabolic Disorder Labs: Lab Results  Component Value Date   HGBA1C 5.6 04/05/2020   MPG 114.02 04/05/2020   No results found for: "PROLACTIN" Lab Results  Component Value Date   CHOL 183 03/03/2023   TRIG 126 03/03/2023   HDL 42 (L) 03/03/2023   CHOLHDL 4.4 03/03/2023   VLDL 61 (H) 04/05/2020   LDLCALC 117 (H) 03/03/2023   LDLCALC 127 (H) 06/06/2021    Physical Findings: AIMS:  , ,  ,  ,    CIWA:  CIWA-Ar Total: 13 COWS:      Psychiatric Specialty Exam:  Presentation  General Appearance:  Appropriate for Environment; Casual  Eye Contact: Minimal  Speech: Clear and Coherent  Speech Volume: Normal    Mood and Affect  Mood: Depressed; Anxious  Affect: Depressed; Flat   Thought Process  Thought  Processes: Coherent  Descriptions of Associations:Intact  Orientation:Full (Time, Place and Person)  Thought Content:Logical  Hallucinations:Hallucinations: None  Ideas of Reference:None  Suicidal Thoughts:Suicidal Thoughts: No  Homicidal Thoughts:Homicidal Thoughts: No   Sensorium  Memory: Immediate Fair; Recent Fair; Remote Fair  Judgment: Impaired  Insight: Shallow   Executive Functions  Concentration: Fair  Attention Span: Fair  Recall: Fair  Fund of Knowledge: Fair  Language:No data recorded  Psychomotor Activity  Psychomotor Activity: Psychomotor Activity: Normal  Musculoskeletal: Strength & Muscle Tone: within normal limits Gait & Station: normal Assets  Assets: Manufacturing systems engineer; Desire for Improvement; Resilience    Physical Exam: Physical Exam Vitals and nursing note reviewed.  HENT:     Head: Normocephalic.     Nose: Nose normal.     Mouth/Throat:     Mouth: Mucous membranes are moist.  Cardiovascular:     Rate and Rhythm: Normal rate.     Pulses: Normal pulses.  Pulmonary:     Effort: Pulmonary effort is normal.  Abdominal:     General: Bowel sounds are normal.  Skin:    General: Skin is warm.  Neurological:     General: No focal deficit present.     Mental Status: She is alert.    Review of Systems  Constitutional: Negative.   HENT: Negative.    Eyes: Negative.   Cardiovascular: Negative.   Skin: Negative.   Neurological: Negative.    Blood pressure 137/60, pulse 62, temperature (!) 97.2 F (36.2 C), resp. rate 15, height 4\' 11"  (1.499 m), weight 57.6 kg, SpO2 98%. Body mass index is 25.65 kg/m.  Diagnosis: Principal Problem:   MDD (major depressive disorder),  recurrent episode, severe (HCC) Alcohol use disorder, Severe, with withdrawals  Clinical Decision Making: Patient admitted for worsening depression with suicidal ideation in the context of multiple psychosocial stressors, death anniversary of her  husband, her daughter and granddaughter living at the house having family conflict, substance use.  Patient is very impulsive and remains high risk for suicide.  She is also on alcohol detox protocol and needs be monitored closely. She needs inpatient hospitalization for further stabilization.   Treatment Plan Summary:   Safety and Monitoring:             -- Voluntary admission to inpatient psychiatric unit for safety, stabilization and treatment             -- Daily contact with patient to assess and evaluate symptoms and progress in treatment             -- Patient's case to be discussed in multi-disciplinary team meeting             -- Observation Level: q15 minute checks             -- Vital signs:  q12 hours             -- Precautions: suicide, elopement, and assault   2. Psychiatric Diagnoses and Treatment:         Will increase Cymbalta to 60 mg daily on 05/03/23 Alcohol withdrawal-CIWA protocol with Ativan as needed  Seroquel 100 mg nightly-medication   -- The risks/benefits/side-effects/alternatives to this medication were discussed in detail with the patient and time was given for questions. The patient consents to medication trial.                -- Metabolic profile and EKG monitoring obtained while on an atypical antipsychotic (BMI: Lipid Panel: HbgA1c: QTc:)              -- Encouraged patient to participate in unit milieu and in scheduled group therapies                            3. Medical Issues Being Addressed:   Alcohol withdrawal- on CIWA and ativan protocol   4. Discharge Planning:              -- Social work and case management to assist with discharge planning and identification of hospital follow-up needs prior to discharge             -- Estimated LOS: 2-3 days             -- Discharge Concerns: Need to establish a safety plan; Medication compliance and effectiveness             -- Discharge Goals: Return home with outpatient referrals follow ups   Physician  Treatment Plan for Primary Diagnosis: MDD (major depressive disorder), recurrent episode, severe (HCC) Long Term Goal(s): Improvement in symptoms so as ready for discharge   Short Term Goals: Ability to identify changes in lifestyle to reduce recurrence of condition will improve, Ability to verbalize feelings will improve, Ability to disclose and discuss suicidal ideas, Ability to demonstrate self-control will improve, and Ability to identify and develop effective coping behaviors will improve   Physician Treatment Plan for Secondary Diagnosis: Principal Problem:   MDD (major depressive disorder), recurrent episode, severe (HCC)   Long Term Goal(s): Improvement in symptoms so as ready for discharge   Short Term Goals: Ability to identify changes in lifestyle to  reduce recurrence of condition will improve, Ability to verbalize feelings will improve, Ability to disclose and discuss suicidal ideas, Ability to demonstrate self-control will improve, Ability to identify and develop effective coping behaviors will improve, Ability to maintain clinical measurements within normal limits will improve, Compliance with prescribed medications will improve, and Ability to identify triggers associated with substance abuse/mental health issues will improve  Verner Chol, MD 05/02/2023, 2:11 PM

## 2023-05-02 NOTE — Progress Notes (Signed)
   05/02/23 1300  Psych Admission Type (Psych Patients Only)  Admission Status Involuntary  Psychosocial Assessment  Patient Complaints Anxiety  Eye Contact Fair  Facial Expression Sad  Affect Depressed  Speech Logical/coherent  Interaction Assertive  Motor Activity Slow  Appearance/Hygiene In scrubs  Behavior Characteristics Cooperative  Mood Depressed;Anxious  Thought Process  Coherency WDL  Content Preoccupation  Delusions None reported or observed  Perception WDL  Hallucination None reported or observed  Judgment Impaired  Confusion None  Danger to Self  Current suicidal ideation? Denies  Agreement Not to Harm Self Yes  Description of Agreement Verbal  Danger to Others  Danger to Others Reported or observed  Danger to Others Abnormal  Harmful Behavior to others No threats or harm toward other people  Destructive Behavior No threats or harm toward property   No changes in behavior. Medicated  X 1 for anxiety and had very good relief. Patient has been asking doctor and social working about being discharged on Monday because she has a doctor's appointment concerning her spine. Pt. Sociable but restless at times.

## 2023-05-02 NOTE — Progress Notes (Signed)
   05/02/23 0600  15 Minute Checks  Location Bedroom  Visual Appearance Calm  Behavior Sleeping  Sleep (Behavioral Health Patients Only)  Calculate sleep? (Click Yes once per 24 hr at 0600 safety check) Yes  Documented sleep last 24 hours 10.25

## 2023-05-02 NOTE — Plan of Care (Signed)
  Problem: Safety: Goal: Ability to disclose and discuss suicidal ideas will improve Outcome: Progressing   Problem: Self-Concept: Goal: Level of anxiety will decrease Outcome: Not Progressing

## 2023-05-02 NOTE — Group Note (Signed)
 Therapy Group Note  Group Topic:Neurographic Art  Group Date: 05/02/2023 Start Time: 1300 End Time: 1400 Facilitators: Lottie Mussel, OT     Group Description: Group participated with Neurographic art activity, using watercolor paints to facilitate creative expression and meditation/relaxation for each individual.  Incorporated bimanual coordination, mental focus, emotional processing, task/command following and relaxation techniques as appropriate.  Patients engaged socially with therapist and other group participants throughout session. Allowed to ask questions as appropriate, and encouraged to identify ways they could use/share their creations with themselves and others.   Therapeutic Goal(s): Demonstrate ability to independently manipulate utensils required to participate with and complete activity. Demonstrate ability to cognitively focus on task and follow commands necessary for completion. Demonstrate use of art as an outlet for emotional processing and expression. Identify and demonstrate importance of relaxation, neural calming and meditation for improved participation with life groups.   Individual Participation: Pt arrived approx 15 min after start time and eager to participate. Pt required some initial instruction and additional cues and visual demo for technique but then able to independently complete. Pt engaged with prompts and near the end of the group independently shared personal story related to art with group. Pt appreciative of opportunity.    Participation Level: Active and Engaged   Participation Quality: Minimal Cues   Behavior: Alert, Appropriate, Attentive , Calm, Cooperative, and Eager   Speech/Thought Process: Coherent and Relevant   Affect/Mood: Appropriate, Happy, and Stable    Insight: Good   Judgement: Good   Modes of Intervention: Activity, Clarification, Discussion, Education, Exploration, and Socialization  Patient Response to Interventions:   Attentive, Engaged, and Receptive   Plan: Continue to engage patient in OT groups 1 - 2x/week.   Arman Filter., MPH, MS, OTR/L ascom [phone] 05/02/23, 4:17 PM

## 2023-05-02 NOTE — Group Note (Signed)
 Recreation Therapy Group Note   Group Topic:Leisure Education  Group Date: 05/02/2023 Start Time: 1400 End Time: 1450 Facilitators: Rosina Lowenstein, LRT, CTRS Location:  Dayroom  Group Description: Bingo. LRT and patients played multiple games of Bingo with music playing in the background. LRT and pts discussed how this could be a leisure interest and the importance of doing things they enjoy post-discharge. Pts won stress balls as Chief Financial Officer.    Goal Area(s) Addressed: Patient will identify leisure interests.  Patient will practice healthy decision making. Patient will engage in recreation activity.  Patient will increase communication.   Affect/Mood: Appropriate   Participation Level: Active and Engaged   Participation Quality: Independent   Behavior: Appropriate, Calm, and Cooperative   Speech/Thought Process: Coherent   Insight: Fair   Judgement: Fair    Modes of Intervention: Cooperative Play and Music   Patient Response to Interventions:  Attentive, Engaged, Interested , and Receptive   Education Outcome:  Acknowledges education   Clinical Observations/Individualized Feedback: Daisy Salinas was active in their participation of session activities and group discussion. Pt interacted well with LRT and peers duration of session.    Plan: Continue to engage patient in RT group sessions 2-3x/week.   Rosina Lowenstein, LRT, CTRS 05/02/2023 4:32 PM

## 2023-05-02 NOTE — Group Note (Signed)
 Date:  05/03/2023 Time:  12:01 AM  Group Topic/Focus:  Personal Choices and Values:   The focus of this group is to help patients assess and explore the importance of values in their lives, how their values affect their decisions, how they express their values and what opposes their expression.    Participation Level:  Active  Participation Quality:  Appropriate  Affect:  Appropriate  Cognitive:  Appropriate  Insight: Appropriate  Engagement in Group:  Engaged  Modes of Intervention:  Discussion  Additional Comments:    Burt Ek 05/03/2023, 12:01 AM

## 2023-05-02 NOTE — BH IP Treatment Plan (Signed)
 Interdisciplinary Treatment and Diagnostic Plan Update  05/02/2023 Time of Session: 10:10AM Daisy Salinas MRN: [id number]  Principal Diagnosis: MDD (major depressive disorder), recurrent episode, severe (HCC)  Secondary Diagnoses: Principal Problem:   MDD (major depressive disorder), recurrent episode, severe (HCC)   Current Medications:  Current Facility-Administered Medications  Medication Dose Route Frequency Provider Last Rate Last Admin   acetaminophen (TYLENOL) tablet 650 mg  650 mg Oral Q6H PRN Onuoha, Chinwendu V, NP   650 mg at 05/01/23 0900   alum & mag hydroxide-simeth (MAALOX/MYLANTA) 200-200-20 MG/5ML suspension 15 mL  15 mL Oral Q6H PRN Onuoha, Chinwendu V, NP       amLODipine (NORVASC) tablet 10 mg  10 mg Oral QHS McLauchlin, Angela, NP   10 mg at 05/01/23 2113   atorvastatin (LIPITOR) tablet 40 mg  40 mg Oral QHS McLauchlin, Angela, NP   40 mg at 05/01/23 2113   bictegravir-emtricitabine-tenofovir AF (BIKTARVY) 50-200-25 MG per tablet 1 tablet  1 tablet Oral Daily McLauchlin, Angela, NP   1 tablet at 05/01/23 0829   DULoxetine (CYMBALTA) DR capsule 30 mg  30 mg Oral Daily Verner Chol, MD   30 mg at 05/01/23 1244   folic acid (FOLVITE) tablet 1 mg  1 mg Oral Daily Verner Chol, MD   1 mg at 05/01/23 1244   hydrOXYzine (ATARAX) tablet 50 mg  50 mg Oral Q6H PRN Verner Chol, MD   50 mg at 05/01/23 1736   irbesartan (AVAPRO) tablet 300 mg  300 mg Oral Daily McLauchlin, Marylene Land, NP   300 mg at 05/01/23 0829   LORazepam (ATIVAN) tablet 1-4 mg  1-4 mg Oral Q1H PRN Verner Chol, MD       Or   LORazepam (ATIVAN) injection 1-4 mg  1-4 mg Intravenous Q1H PRN Verner Chol, MD       magnesium hydroxide (MILK OF MAGNESIA) suspension 15 mL  15 mL Oral Daily PRN Onuoha, Chinwendu V, NP       multivitamin with minerals tablet 1 tablet  1 tablet Oral Daily Verner Chol, MD   1 tablet at 05/01/23 1244   nicotine polacrilex (NICORETTE) gum 2 mg  2 mg Oral PRN Verner Chol, MD       OLANZapine (ZYPREXA) injection 5 mg  5 mg Intramuscular TID PRN Onuoha, Chinwendu V, NP       OLANZapine zydis (ZYPREXA) disintegrating tablet 5 mg  5 mg Oral TID PRN Onuoha, Chinwendu V, NP       QUEtiapine (SEROQUEL) tablet 100 mg  100 mg Oral QHS McLauchlin, Angela, NP   100 mg at 05/01/23 2113   thiamine (VITAMIN B1) tablet 100 mg  100 mg Oral Daily Verner Chol, MD   100 mg at 05/01/23 1244   Or   thiamine (VITAMIN B1) injection 100 mg  100 mg Intravenous Daily Verner Chol, MD       PTA Medications: Medications Prior to Admission  Medication Sig Dispense Refill Last Dose/Taking   ALEVE 220 MG tablet Take 220-440 mg by mouth 2 (two) times daily as needed (for headaches).      amLODipine (NORVASC) 10 MG tablet Take 1 tablet (10 mg total) by mouth daily. (Patient taking differently: Take 10 mg by mouth at bedtime.) 90 tablet 1    Aromatic Inhalants (VICKS VAPOINHALER) INHA Place 1-2 Inhalations into both nostrils every 2 (two) hours as needed (for nasal congestion).      atorvastatin (LIPITOR) 40 MG tablet Take 1 tablet (40 mg total) by  mouth daily. (Patient taking differently: Take 40 mg by mouth at bedtime.) 30 tablet 11    bictegravir-emtricitabine-tenofovir AF (BIKTARVY) 50-200-25 MG TABS tablet Take 1 tablet by mouth daily. (Patient taking differently: Take 1 tablet by mouth at bedtime.) 30 tablet 5    meloxicam (MOBIC) 15 MG tablet One tab PO qAM with breakfast for 2 weeks, then daily prn pain. (Patient taking differently: Take 15 mg by mouth See admin instructions. Starting on 03/27/2023, take 15 mg by mouth in the morning with breakfast for 2 weeks, then once a day as needed for pain) 30 tablet 0    olmesartan (BENICAR) 40 MG tablet Take 1 tablet (40 mg total) by mouth daily. (Patient taking differently: Take 40 mg by mouth at bedtime.) 90 tablet 1    sodium chloride (OCEAN) 0.65 % SOLN nasal spray Place 1 spray into both nostrils as needed for congestion.        Patient Stressors:    Patient Strengths:    Treatment Modalities: Medication Management, Group therapy, Case management,  1 to 1 session with clinician, Psychoeducation, Recreational therapy.   Physician Treatment Plan for Primary Diagnosis: MDD (major depressive disorder), recurrent episode, severe (HCC) Long Term Goal(s): Improvement in symptoms so as ready for discharge   Short Term Goals: Ability to identify changes in lifestyle to reduce recurrence of condition will improve Ability to verbalize feelings will improve Ability to disclose and discuss suicidal ideas Ability to demonstrate self-control will improve Ability to identify and develop effective coping behaviors will improve Ability to maintain clinical measurements within normal limits will improve Compliance with prescribed medications will improve Ability to identify triggers associated with substance abuse/mental health issues will improve  Medication Management: Evaluate patient's response, side effects, and tolerance of medication regimen.  Therapeutic Interventions: 1 to 1 sessions, Unit Group sessions and Medication administration.  Evaluation of Outcomes: Progressing  Physician Treatment Plan for Secondary Diagnosis: Principal Problem:   MDD (major depressive disorder), recurrent episode, severe (HCC)  Long Term Goal(s): Improvement in symptoms so as ready for discharge   Short Term Goals: Ability to identify changes in lifestyle to reduce recurrence of condition will improve Ability to verbalize feelings will improve Ability to disclose and discuss suicidal ideas Ability to demonstrate self-control will improve Ability to identify and develop effective coping behaviors will improve Ability to maintain clinical measurements within normal limits will improve Compliance with prescribed medications will improve Ability to identify triggers associated with substance abuse/mental health issues will improve      Medication Management: Evaluate patient's response, side effects, and tolerance of medication regimen.  Therapeutic Interventions: 1 to 1 sessions, Unit Group sessions and Medication administration.  Evaluation of Outcomes: Progressing   RN Treatment Plan for Primary Diagnosis: MDD (major depressive disorder), recurrent episode, severe (HCC) Long Term Goal(s): Knowledge of disease and therapeutic regimen to maintain health will improve  Short Term Goals: Ability to demonstrate self-control, Ability to participate in decision making will improve, Ability to verbalize feelings will improve, Ability to disclose and discuss suicidal ideas, Ability to identify and develop effective coping behaviors will improve, and Compliance with prescribed medications will improve  Medication Management: RN will administer medications as ordered by provider, will assess and evaluate patient's response and provide education to patient for prescribed medication. RN will report any adverse and/or side effects to prescribing provider.  Therapeutic Interventions: 1 on 1 counseling sessions, Psychoeducation, Medication administration, Evaluate responses to treatment, Monitor vital signs and CBGs as ordered, Perform/monitor CIWA, COWS,  AIMS and Fall Risk screenings as ordered, Perform wound care treatments as ordered.  Evaluation of Outcomes: Progressing   LCSW Treatment Plan for Primary Diagnosis: MDD (major depressive disorder), recurrent episode, severe (HCC) Long Term Goal(s): Safe transition to appropriate next level of care at discharge, Engage patient in therapeutic group addressing interpersonal concerns.  Short Term Goals: Engage patient in aftercare planning with referrals and resources, Increase social support, Increase ability to appropriately verbalize feelings, Increase emotional regulation, Facilitate acceptance of mental health diagnosis and concerns, Facilitate patient progression through stages of  change regarding substance use diagnoses and concerns, Identify triggers associated with mental health/substance abuse issues, and Increase skills for wellness and recovery  Therapeutic Interventions: Assess for all discharge needs, 1 to 1 time with Social worker, Explore available resources and support systems, Assess for adequacy in community support network, Educate family and significant other(s) on suicide prevention, Complete Psychosocial Assessment, Interpersonal group therapy.  Evaluation of Outcomes: Progressing   Progress in Treatment: Attending groups: Yes. Participating in groups: Yes. Taking medication as prescribed: Yes. Toleration medication: Yes. Family/Significant other contact made: No, will contact:  CSW will contact once permission has been given Patient understands diagnosis: Yes. Discussing patient identified problems/goals with staff: Yes. Medical problems stabilized or resolved: Yes. Denies suicidal/homicidal ideation: Yes. Issues/concerns per patient self-inventory: No. Other: none  New problem(s) identified: No, Describe:  none  New Short Term/Long Term Goal(s): detox, elimination of symptoms of psychosis, medication management for mood stabilization; elimination of SI thoughts; development of comprehensive mental wellness/sobriety plan.   Patient Goals:  "get back to my job, keep up with my appointments"  Discharge Plan or Barriers: CSW to assist in the development of appropriate discharge plans.   Reason for Continuation of Hospitalization: Anxiety Depression Medication stabilization Suicidal ideation  Estimated Length of Stay: TBD  Last 3 [place] Suicide Severity Risk Score: Flowsheet Row Admission (Current) from 04/30/2023 in [facility] ED from 04/29/2023 in [facility] Emergency Department at [facility] ED from 03/23/2023 in [facility] Emergency Department at [facility]  C-SSRS RISK CATEGORY Low Risk High Risk  No Risk       Last [facility] 2/9 Scores:    03/04/2023    3:31 PM 10/02/2022    8:51 AM 01/09/2022    9:52 AM  Depression screen PHQ 2/9  Decreased Interest 1 0 0  Down, Depressed, Hopeless 1 0 1  PHQ - 2 Score 2 0 1  Altered sleeping 1    Tired, decreased energy 1    Change in appetite 1    Feeling bad or failure about yourself  1    Trouble concentrating 1    Moving slowly or fidgety/restless 1    Suicidal thoughts 0    PHQ-9 Score 8      Scribe for Treatment Team: Harden Mo, LCSW 05/02/2023 10:58 AM

## 2023-05-02 NOTE — Group Note (Signed)
 Date:  05/02/2023 Time:  10:55 AM  Group Topic/Focus:  Crisis Planning:   The purpose of this group is to help patients create a crisis plan for use upon discharge or in the future, as needed.    Participation Level:  Active  Participation Quality:  Appropriate  Affect:  Appropriate  Cognitive:  Appropriate  Insight: Appropriate  Engagement in Group:  Engaged  Modes of Intervention:  Discussion  Ardelle Anton 05/02/2023, 10:55 AM

## 2023-05-03 DIAGNOSIS — F333 Major depressive disorder, recurrent, severe with psychotic symptoms: Secondary | ICD-10-CM | POA: Diagnosis not present

## 2023-05-03 NOTE — Progress Notes (Signed)
 [facility] MD Progress Note  05/03/2023 2:27 PM Daisy Salinas  MRN:  [id number]  Pt was brought from her home to [facility]. Pt said that she was "tired of living." She says that "I can't get it right.' She lives with her daughter and her family in her home and has to pay rent. Pt husband died 2019/05/26. She said she would overdose on medications. She wants to get an apartment of her own but her credit is bad. Patient has chronic health problems and back pain. She denies any HI. She has some tactile hallucinations when she lays down "I can feel something inside the bed." Patient last used cocaine (snorting) two days ago. Pt also purchases percocet from a neighbor occasionally. Pt says she last used ETOH on Saturday (04/05) and drank 4oz of tequila. Patient is admitted to [facility] unit with Q15 min safety monitoring. Multidisciplinary team approach is offered. Medication management; group/milieu therapy is offered.   Per nursing she scored CIWA 13 05/02/23 05/03/23 CIWA 0 twice   Subjective:  Chart reviewed, case discussed in multidisciplinary meeting, patient seen during rounds.  Patient is more active and visible on the unit.  She denies any nausea, vomiting and denies any withdrawal from alcohol.  She continues to be discharged focused and wants to go home.  She talks about her appointments on 05-26-23 and Tuesday and her psychiatric appointments may 15.  She did inform the provider that she can go as a walk-in to her clinic to be seen sooner than that.  She denies consistently suicidal/homicidal ideation/intent/plan.  She denies auditory/visual hallucinations.  She has fair appetite.  She is taking her medications and is tolerating them well  Sleep: Poor  Appetite:  Poor  Past Psychiatric History: see h&P Family History:  Family History  Problem Relation Age of Onset   Coronary artery disease Mother    Leukemia Father    Social History:  Social History   Substance and Sexual Activity  Alcohol Use  Yes   Alcohol/week: 2.0 standard drinks of alcohol   Types: 1 Cans of beer, 1 Standard drinks or equivalent per week   Comment: occ     Social History   Substance and Sexual Activity  Drug Use Yes   Frequency: 1.0 times per week   Types: Cocaine   Comment: Occassional use    Social History   Socioeconomic History   Marital status: Widowed    Spouse name: Not on file   Number of children: Not on file   Years of education: Not on file   Highest education level: Not on file  Occupational History   Occupation: unemployed  Tobacco Use   Smoking status: Every Day    Current packs/day: 0.70    Average packs/day: 0.7 packs/day for 45.0 years (31.5 ttl pk-yrs)    Types: Cigarettes   Smokeless tobacco: Never   Tobacco comments:    wanting "some cinnamon" to help her stop  Vaping Use   Vaping status: Never Used  Substance and Sexual Activity   Alcohol use: Yes    Alcohol/week: 2.0 standard drinks of alcohol    Types: 1 Cans of beer, 1 Standard drinks or equivalent per week    Comment: occ   Drug use: Yes    Frequency: 1.0 times per week    Types: Cocaine    Comment: Occassional use   Sexual activity: Not Currently    Partners: Male    Comment: declined condoms  Other Topics Concern  Not on file  Social History Narrative   Not on file   Social Drivers of Health   Financial Resource Strain: Not on file  Food Insecurity: Patient Declined (04/30/2023)   Hunger Vital Sign    Worried About Running Out of Food in the Last Year: Patient declined    Ran Out of Food in the Last Year: Patient declined  Transportation Needs: No Transportation Needs (04/30/2023)   PRAPARE - Administrator, Civil Service (Medical): No    Lack of Transportation (Non-Medical): No  Physical Activity: Not on file  Stress: Not on file  Social Connections: Moderately Isolated (04/30/2023)   Social Connection and Isolation Panel [NHANES]    Frequency of Communication with Friends and Family:  More than three times a week    Frequency of Social Gatherings with Friends and Family: More than three times a week    Attends Religious Services: 1 to 4 times per year    Active Member of [employer] or Organizations: No    Attends Banker Meetings: Never    Marital Status: Widowed   Past Medical History:  Past Medical History:  Diagnosis Date   Asthma    Bipolar 1 disorder (HCC) 2009   Depression    GERD (gastroesophageal reflux disease)    HIV (human immunodeficiency virus infection) (HCC)    Hypertension    TB (pulmonary tuberculosis) 1989   was exposed and treated   UTI (urinary tract infection) 1997    Past Surgical History:  Procedure Laterality Date   ABDOMINAL HYSTERECTOMY      Current Medications: Current Facility-Administered Medications  Medication Dose Route Frequency Provider Last Rate Last Admin   acetaminophen (TYLENOL) tablet 650 mg  650 mg Oral Q6H PRN Onuoha, Chinwendu V, NP   650 mg at 05/03/23 0959   alum & mag hydroxide-simeth (MAALOX/MYLANTA) 200-200-20 MG/5ML suspension 15 mL  15 mL Oral Q6H PRN Onuoha, Chinwendu V, NP       amLODipine (NORVASC) tablet 10 mg  10 mg Oral QHS McLauchlin, Angela, NP   10 mg at 05/02/23 2113   atorvastatin (LIPITOR) tablet 40 mg  40 mg Oral QHS McLauchlin, Angela, NP   40 mg at 05/02/23 2211   bictegravir-emtricitabine-tenofovir AF (BIKTARVY) 50-200-25 MG per tablet 1 tablet  1 tablet Oral Daily McLauchlin, Angela, NP   1 tablet at 05/03/23 1150   DULoxetine (CYMBALTA) DR capsule 60 mg  60 mg Oral Daily Itzia Cunliffe, MD   60 mg at 05/03/23 0956   folic acid (FOLVITE) tablet 1 mg  1 mg Oral Daily Jamise Pentland, MD   1 mg at 05/03/23 0956   hydrOXYzine (ATARAX) tablet 50 mg  50 mg Oral Q6H PRN Earl Losee, MD   50 mg at 05/03/23 0956   irbesartan (AVAPRO) tablet 300 mg  300 mg Oral Daily McLauchlin, Angela, NP   300 mg at 05/03/23 0956   LORazepam (ATIVAN) tablet 1-4 mg  1-4 mg Oral Q1H PRN Shantia Sanford, MD    2 mg at 05/02/23 1235   Or   LORazepam (ATIVAN) injection 1-4 mg  1-4 mg Intravenous Q1H PRN Javed Cotto, MD       magnesium hydroxide (MILK OF MAGNESIA) suspension 15 mL  15 mL Oral Daily PRN Onuoha, Chinwendu V, NP       multivitamin with minerals tablet 1 tablet  1 tablet Oral Daily Lennin Osmond, MD   1 tablet at 05/03/23 0955   nicotine polacrilex (NICORETTE) gum  2 mg  2 mg Oral PRN Jerick Khachatryan, MD       OLANZapine (ZYPREXA) injection 5 mg  5 mg Intramuscular TID PRN Onuoha, Chinwendu V, NP       OLANZapine zydis (ZYPREXA) disintegrating tablet 5 mg  5 mg Oral TID PRN Onuoha, Chinwendu V, NP       QUEtiapine (SEROQUEL) tablet 100 mg  100 mg Oral QHS McLauchlin, Angela, NP   100 mg at 05/02/23 2113   thiamine (VITAMIN B1) tablet 100 mg  100 mg Oral Daily Kennadi Albany, MD   100 mg at 05/03/23 1610   Or   thiamine (VITAMIN B1) injection 100 mg  100 mg Intravenous Daily Dannetta Lekas, MD        Lab Results: No results found for this or any previous visit (from the past 48 hours).  Blood Alcohol level:  Lab Results  Component Value Date   ETH <10 04/29/2023   ETH <10 04/05/2020    Metabolic Disorder Labs: Lab Results  Component Value Date   HGBA1C 5.6 04/05/2020   MPG 114.02 04/05/2020   No results found for: "PROLACTIN" Lab Results  Component Value Date   CHOL 183 03/03/2023   TRIG 126 03/03/2023   HDL 42 (L) 03/03/2023   CHOLHDL 4.4 03/03/2023   VLDL 61 (H) 04/05/2020   LDLCALC 117 (H) 03/03/2023   LDLCALC 127 (H) 06/06/2021    Physical Findings: AIMS:  , ,  ,  ,    CIWA:  CIWA-Ar Total: 0 COWS:      Psychiatric Specialty Exam:  Presentation  General Appearance:  Appropriate for Environment; Casual  Eye Contact: Fair  Speech: Clear and Coherent  Speech Volume: Normal    Mood and Affect  Mood: Depressed  Affect: Depressed; Flat   Thought Process  Thought Processes: Coherent  Descriptions of  Associations:Intact  Orientation:Full (Time, Place and Person)  Thought Content:Logical  Hallucinations:Hallucinations: None  Ideas of Reference:None  Suicidal Thoughts:Suicidal Thoughts: No  Homicidal Thoughts:Homicidal Thoughts: No   Sensorium  Memory: Immediate Fair; Recent Fair; Remote Fair  Judgment: Impaired  Insight: Shallow   Executive Functions  Concentration: Fair  Attention Span: Fair  Recall: Fair  Fund of Knowledge: Fair  Language:Fair   Psychomotor Activity  Psychomotor Activity: Psychomotor Activity: Normal  Musculoskeletal: Strength & Muscle Tone: within normal limits Gait & Station: normal Assets  Assets: Manufacturing systems engineer; Desire for Improvement; Physical Health; Resilience    Physical Exam: Physical Exam Vitals and nursing note reviewed.  HENT:     Head: Normocephalic.     Nose: Nose normal.     Mouth/Throat:     Mouth: Mucous membranes are moist.  Cardiovascular:     Rate and Rhythm: Normal rate.     Pulses: Normal pulses.  Pulmonary:     Effort: Pulmonary effort is normal.  Abdominal:     General: Bowel sounds are normal.  Skin:    General: Skin is warm.  Neurological:     General: No focal deficit present.     Mental Status: She is alert.    Review of Systems  Constitutional: Negative.   HENT: Negative.    Eyes: Negative.   Cardiovascular: Negative.   Skin: Negative.   Neurological: Negative.    Blood pressure 118/63, pulse 63, temperature (!) 97.1 F (36.2 C), temperature source Tympanic, resp. rate 18, height 4\' 11"  (1.499 m), weight 57.6 kg, SpO2 100%. Body mass index is 25.65 kg/m.  Diagnosis: Principal Problem:   MDD (  major depressive disorder), recurrent episode, severe (HCC) Alcohol use disorder, Severe, with withdrawals  Clinical Decision Making: Patient admitted for worsening depression with suicidal ideation in the context of multiple psychosocial stressors, death anniversary of her  husband, her daughter and granddaughter living at the house having family conflict, substance use.  Patient is very impulsive and remains high risk for suicide.  She is also on alcohol detox protocol and needs be monitored closely. She needs inpatient hospitalization for further stabilization.   Treatment Plan Summary:   Safety and Monitoring:             -- Voluntary admission to inpatient psychiatric unit for safety, stabilization and treatment             -- Daily contact with patient to assess and evaluate symptoms and progress in treatment             -- Patient's case to be discussed in multi-disciplinary team meeting             -- Observation Level: q15 minute checks             -- Vital signs:  q12 hours             -- Precautions: suicide, elopement, and assault   2. Psychiatric Diagnoses and Treatment:          Cymbalta  60 mg daily  Alcohol withdrawal-CIWA protocol with Ativan as needed  Seroquel 100 mg nightly-medication   -- The risks/benefits/side-effects/alternatives to this medication were discussed in detail with the patient and time was given for questions. The patient consents to medication trial.                -- Metabolic profile and EKG monitoring obtained while on an atypical antipsychotic (BMI: Lipid Panel: HbgA1c: QTc:)              -- Encouraged patient to participate in unit milieu and in scheduled group therapies                            3. Medical Issues Being Addressed:   Alcohol withdrawal- on CIWA and ativan protocol   4. Discharge Planning:              -- Social work and case management to assist with discharge planning and identification of hospital follow-up needs prior to discharge             -- Estimated LOS: 2-3 days             -- Discharge Concerns: Need to establish a safety plan; Medication compliance and effectiveness             -- Discharge Goals: Return home with outpatient referrals follow ups   Physician Treatment Plan for Primary  Diagnosis: MDD (major depressive disorder), recurrent episode, severe (HCC) Long Term Goal(s): Improvement in symptoms so as ready for discharge   Short Term Goals: Ability to identify changes in lifestyle to reduce recurrence of condition will improve, Ability to verbalize feelings will improve, Ability to disclose and discuss suicidal ideas, Ability to demonstrate self-control will improve, and Ability to identify and develop effective coping behaviors will improve   Physician Treatment Plan for Secondary Diagnosis: Principal Problem:   MDD (major depressive disorder), recurrent episode, severe (HCC)   Long Term Goal(s): Improvement in symptoms so as ready for discharge   Short Term Goals: Ability to identify changes in lifestyle  to reduce recurrence of condition will improve, Ability to verbalize feelings will improve, Ability to disclose and discuss suicidal ideas, Ability to demonstrate self-control will improve, Ability to identify and develop effective coping behaviors will improve, Ability to maintain clinical measurements within normal limits will improve, Compliance with prescribed medications will improve, and Ability to identify triggers associated with substance abuse/mental health issues will improve  Aurelia Blotter, MD 05/03/2023, 2:27 PM

## 2023-05-03 NOTE — Plan of Care (Signed)
 Patient alert and oriented X4. Denies SI, HI, AVH and pain. Scheduled and PRN medications administered per MAR. Support and encouragement provided.  Routine safety checks conducted every 15 minutes.  Patient informed to notify staff with problems or concerns. Patient verbally contracts for safety at this time. Patient interacts well with others on the unit.  Patient remains safe at this time.  Problem: Education: Goal: Utilization of techniques to improve thought processes will improve Outcome: Progressing Goal: Knowledge of the prescribed therapeutic regimen will improve Outcome: Progressing   Problem: Activity: Goal: Interest or engagement in leisure activities will improve Outcome: Progressing Goal: Imbalance in normal sleep/wake cycle will improve Outcome: Progressing   Problem: Coping: Goal: Coping ability will improve Outcome: Progressing Goal: Will verbalize feelings Outcome: Progressing   Problem: Health Behavior/Discharge Planning: Goal: Ability to make decisions will improve Outcome: Progressing Goal: Compliance with therapeutic regimen will improve Outcome: Progressing   Problem: Safety: Goal: Ability to disclose and discuss suicidal ideas will improve Outcome: Progressing Goal: Ability to identify and utilize support systems that promote safety will improve Outcome: Progressing

## 2023-05-03 NOTE — Group Note (Signed)
 Date:  05/03/2023 Time:  12:20 PM  Group Topic/Focus:  Building Self Esteem:   The Focus of this group is helping patients become aware of the effects of self-esteem on their lives, the things they and others do that enhance or undermine their self-esteem, seeing the relationship between their level of self-esteem and the choices they make and learning ways to enhance self-esteem.    Participation Level:  Did Not Attend  Participation Quality:    Affect:    Cognitive:    Insight:   Engagement in Group:    Modes of Intervention:    Additional Comments:    Farrin Shadle 05/03/2023, 12:20 PM

## 2023-05-03 NOTE — Progress Notes (Signed)
   05/02/23 2029  Psych Admission Type (Psych Patients Only)  Admission Status Involuntary  Psychosocial Assessment  Patient Complaints Anxiety  Eye Contact Fair  Facial Expression Sad  Affect Flat  Speech Logical/coherent  Interaction Assertive  Motor Activity Slow  Appearance/Hygiene Unremarkable  Behavior Characteristics Cooperative  Mood Depressed ("I'm going to rest while I am here; so I can go home and get back to work!")  Aggressive Behavior  Effect No apparent injury  Thought Process  Coherency WDL  Content WDL  Delusions None reported or observed  Perception WDL  Hallucination None reported or observed  Judgment Impaired  Confusion None  Danger to Self  Current suicidal ideation? Denies  Agreement Not to Harm Self Yes  Description of Agreement Verbal  Danger to Others  Danger to Others Reported or observed  Danger to Others Abnormal  Harmful Behavior to others No threats or harm toward other people  Destructive Behavior No threats or harm toward property

## 2023-05-03 NOTE — Progress Notes (Signed)
   05/03/23 2100  Psych Admission Type (Psych Patients Only)  Admission Status Involuntary  Psychosocial Assessment  Patient Complaints Anxiety  Eye Contact Fair  Facial Expression Sad  Affect Flat  Speech Logical/coherent  Interaction Assertive  Motor Activity Slow  Appearance/Hygiene Unremarkable  Behavior Characteristics Cooperative  Mood Sad  Thought Process  Coherency WDL  Content WDL  Delusions None reported or observed  Perception WDL  Hallucination None reported or observed  Judgment Impaired  Confusion None  Danger to Self  Current suicidal ideation? Denies   Alert and oriented. Flat affect. Pt endorses anxiety. C/o back pain.PRNs given for anxiety and pain. Tol well. Visible in dayroom. Attends group. Denies SI/HI/AVH. No c/o pain/discomfort noted.

## 2023-05-03 NOTE — Group Note (Signed)
 Date:  05/03/2023 Time:  11:13 PM  Group Topic/Focus:  Healthy Communication:   The focus of this group is to discuss communication, barriers to communication, as well as healthy ways to communicate with others.    Participation Level:  Active  Participation Quality:  Appropriate  Affect:  Appropriate  Cognitive:  Appropriate  Insight: Appropriate  Engagement in Group:  Engaged  Modes of Intervention:  Discussion  Additional Comments:    Lynette Saras 05/03/2023, 11:13 PM

## 2023-05-03 NOTE — Progress Notes (Signed)
   05/03/23 0600  15 Minute Checks  Location Bedroom  Visual Appearance Calm  Behavior Sleeping  Sleep (Behavioral Health Patients Only)  Calculate sleep? (Click Yes once per 24 hr at 0600 safety check) Yes  Documented sleep last 24 hours 9

## 2023-05-03 NOTE — Plan of Care (Signed)
 D: Pt alert and oriented. Pt reports experiencing anxiety however denies experiencing any depression at this time. Pt reports experiencing 8/10 left leg pain, prn medication given. Pt denies experiencing any SI/HI, or AVH at this time.   A: Scheduled medications administered to pt, per MD orders. Support and encouragement provided. Frequent verbal contact made. Routine safety checks conducted q15 minutes.   R: No adverse drug reactions noted. Pt verbally contracts for safety at this time. Pt compliant with medications and treatment plan. Pt interacts well with others on the unit. Pt remains safe at this time. Plan of care ongoing.  Problem: Coping: Goal: Will verbalize feelings Outcome: Progressing   Problem: Self-Concept: Goal: Level of anxiety will decrease Outcome: Not Progressing

## 2023-05-04 DIAGNOSIS — F333 Major depressive disorder, recurrent, severe with psychotic symptoms: Secondary | ICD-10-CM | POA: Diagnosis not present

## 2023-05-04 MED ORDER — AMLODIPINE BESYLATE 10 MG PO TABS: 10.0000 mg | ORAL_TABLET | Freq: Every day | ORAL | 0 refills | Status: DC

## 2023-05-04 MED ORDER — ADULT MULTIVITAMIN W/MINERALS CH: 1.0000 | ORAL_TABLET | Freq: Every day | ORAL | 0 refills | Status: AC

## 2023-05-04 MED ORDER — VITAMIN B-1 100 MG PO TABS: 100.0000 mg | ORAL_TABLET | Freq: Every day | ORAL | 0 refills | Status: AC

## 2023-05-04 MED ORDER — DULOXETINE HCL 60 MG PO CPEP: 60.0000 mg | ORAL_CAPSULE | Freq: Every day | ORAL | 0 refills | Status: DC

## 2023-05-04 MED ORDER — FOLIC ACID 1 MG PO TABS: 1.0000 mg | ORAL_TABLET | Freq: Every day | ORAL | 0 refills | Status: DC

## 2023-05-04 MED ORDER — ATORVASTATIN CALCIUM 40 MG PO TABS: 40.0000 mg | ORAL_TABLET | Freq: Every day | ORAL | 0 refills | Status: DC

## 2023-05-04 MED ORDER — IRBESARTAN 300 MG PO TABS: 300.0000 mg | ORAL_TABLET | Freq: Every day | ORAL | 0 refills | Status: DC

## 2023-05-04 MED ORDER — HYDROXYZINE HCL 50 MG PO TABS: 50.0000 mg | ORAL_TABLET | Freq: Four times a day (QID) | ORAL | 0 refills | Status: DC | PRN

## 2023-05-04 MED ORDER — BICTEGRAVIR-EMTRICITAB-TENOFOV 50-200-25 MG PO TABS: 1.0000 | ORAL_TABLET | Freq: Every day | ORAL | 0 refills | Status: DC

## 2023-05-04 NOTE — Plan of Care (Signed)
 D: Pt alert and oriented. Pt reports experiencing anxiety however, denies experiencing any depression at this time. Pt reports experiencing 8/10 left leg pain, prn medication given. Pt denies experiencing any SI/HI, or AVH at this time.   A: Scheduled medications administered to pt, per MD orders. Support and encouragement provided. Frequent verbal contact made. Routine safety checks conducted q15 minutes.   R: No adverse drug reactions noted. Pt verbally contracts for safety at this time. Pt compliant with medications and treatment plan. Pt interacts well with others on the unit. Pt remains safe at this time. Plan of care ongoing.  Problem: Coping: Goal: Coping ability will improve Outcome: Progressing Goal: Will verbalize feelings Outcome: Progressing

## 2023-05-04 NOTE — Group Note (Signed)
 Date:  05/04/2023 Time:  10:45 PM  Group Topic/Focus:  Wrap-Up Group:   The focus of this group is to help patients review their daily goal of treatment and discuss progress on daily workbooks.    Participation Level:  Active  Participation Quality:  Appropriate  Affect:  Appropriate  Cognitive:  Appropriate  Insight: Appropriate  Engagement in Group:  Engaged  Modes of Intervention:  Discussion  Additional Comments:    Rolland Cline 05/04/2023, 10:45 PM

## 2023-05-04 NOTE — Group Note (Signed)
 Date:  05/04/2023 Time:  10:53 AM  Group Topic/Focus:  Meditation Therapy    Participation Level:  Did Not Attend    Merton Abts 05/04/2023, 10:53 AM

## 2023-05-04 NOTE — Plan of Care (Signed)

## 2023-05-04 NOTE — Progress Notes (Signed)
   05/04/23 0615  15 Minute Checks  Location Bedroom  Visual Appearance Calm  Behavior Sleeping  Sleep (Behavioral Health Patients Only)  Calculate sleep? (Click Yes once per 24 hr at 0600 safety check) Yes  Documented sleep last 24 hours 8.75

## 2023-05-04 NOTE — [facility] Group Notes (Signed)
 LCSW Wellness Group Note   05/04/2023 1:00pm  Type of Group and Topic: Psychoeducational Group:  Wellness  Participation Level:  Active  Description of Group  Wellness group introduces the topic and its focus on developing healthy habits across the spectrum and its relationship to a decrease in hospital admissions.  Six areas of wellness are discussed: physical, social spiritual, intellectual, occupational, and emotional.  Patients are asked to consider their current wellness habits and to identify areas of wellness where they are interested and able to focus on improvements.    Therapeutic Goals Patients will understand components of wellness and how they can positively impact overall health.  Patients will identify areas of wellness where they have developed good habits. Patients will identify areas of wellness where they would like to make improvements.    Summary of Patient Progress: pt attentive in group and active in discussion.  Pt identified occupational wellness as a strength, reports she loves her job.  Pt identified financial wellness as needing improvement.       Therapeutic Modalities: Cognitive Behavioral Therapy Psychoeducation    Elspeth Hals, LCSW

## 2023-05-04 NOTE — [facility] Suicide Risk Assessment (Signed)
 [facility] Discharge Suicide Risk Assessment   Principal Problem: MDD (major depressive disorder), recurrent episode, severe (HCC) Discharge Diagnoses: Principal Problem:   MDD (major depressive disorder), recurrent episode, severe (HCC)   Total Time spent with patient: 30 minutes  Musculoskeletal: Strength & Muscle Tone: within normal limits Gait & Station: normal Patient leans: N/A  Psychiatric Specialty Exam  Presentation  General Appearance:  Appropriate for Environment; Casual  Eye Contact: Fair  Speech: Clear and Coherent  Speech Volume: Normal  Handedness: Right   Mood and Affect  Mood: Euthymic  Duration of Depression Symptoms: Greater than two weeks  Affect: Appropriate   Thought Process  Thought Processes: Coherent  Descriptions of Associations:Intact  Orientation:Full (Time, Place and Person)  Thought Content:Logical  History of Schizophrenia/Schizoaffective disorder:No  Duration of Psychotic Symptoms:Greater than six months  Hallucinations:Hallucinations: None  Ideas of Reference:None  Suicidal Thoughts:Suicidal Thoughts: No  Homicidal Thoughts:Homicidal Thoughts: No   Sensorium  Memory: Immediate Fair; Recent Fair; Remote Fair  Judgment: Fair  Insight: Fair   Art therapist  Concentration: Fair  Attention Span: Fair  Recall: [employer] of Knowledge: Fair  Language: Fair   Psychomotor Activity  Psychomotor Activity: Psychomotor Activity: Normal   Assets  Assets: Communication Skills; Desire for Improvement; Resilience; Social Support   Sleep  Sleep: Sleep: Fair   Physical Exam: Physical Exam ROS Blood pressure (!) 151/75, pulse 61, temperature 98.6 F (37 C), resp. rate 18, height 4\' 11"  (1.499 m), weight 57.6 kg, SpO2 93%. Body mass index is 25.65 kg/m.  Mental Status Per Nursing Assessment::   On Admission:  NA  Demographic Factors:  Low socioeconomic status  Loss Factors: Decrease  in vocational status  Historical Factors: Impulsivity  Risk Reduction Factors:   Sense of responsibility to family, Religious beliefs about death, Living with another person, especially a relative, Positive social support, Positive therapeutic relationship, and Positive coping skills or problem solving skills  Continued Clinical Symptoms:  Depression:   Comorbid alcohol abuse/dependence  Cognitive Features That Contribute To Risk:  None    Suicide Risk:  Minimal: No identifiable suicidal ideation.  Patients presenting with no risk factors but with morbid ruminations; may be classified as minimal risk based on the severity of the depressive symptoms   Follow-up Information     [facility] Follow up.   Specialty: Urgent Care Why: Appointment is scheduled for 06/05/2023 at 1PM for therapy with Paige.  Appointment for medication managment is 06/10/2023 at 9:30Am with Dr. Lorna Rose.  These are the earliest appointment you can walk in Wednesdays to see Germain Kohler and Thursdays to see Dr. Lorna Rose, both days you will need to be there by 7AM.  Correct phone number is [phone], follow prompts for outpatient services. Contact information: [address] [place]  [id number] [phone]                Plan Of Care/Follow-up recommendations:  Activity:  As tolerated  Aurelia Blotter, MD 05/04/2023, 7:53 PM

## 2023-05-04 NOTE — Progress Notes (Signed)
 [facility] MD Progress Note  05/04/2023 7:46 PM Daisy Salinas  MRN:  [id number]  Pt was brought from her home to [facility]. Pt said that she was "tired of living." She says that "I can't get it right.' She lives with her daughter and her family in her home and has to pay rent. Pt husband died 05-21-2019. She said she would overdose on medications. She wants to get an apartment of her own but her credit is bad. Patient has chronic health problems and back pain. She denies any HI. She has some tactile hallucinations when she lays down "I can feel something inside the bed." Patient last used cocaine (snorting) two days ago. Pt also purchases percocet from a neighbor occasionally. Pt says she last used ETOH on Saturday (04/05) and drank 4oz of tequila. Patient is admitted to [facility] unit with Q15 min safety monitoring. Multidisciplinary team approach is offered. Medication management; group/milieu therapy is offered.   Per nursing she scored CIWA 13 05/02/23 05/03/23 CIWA 0 twice   Subjective:  Chart reviewed, case discussed in multidisciplinary meeting, patient seen during rounds.  Today on interview patient is noted to be resting in bed.  She offers no complaints and remains discharge focused.  Provider and patient discussed her life stressors and the need for her to work on her coping skills by seeing a therapist.  Patient reports that she is planning to have her own living place away from her daughter and granddaughter so that she can focus on her wellbeing.  She denies depression but reports feeling anxious.  Provider discussed her chronic alcoholism the need to stay sober so that her anxiety gets under control with medications.  Denies SI/HI/intent/plan.  She denies auditory/visual hallucinations.  Per nursing report she is participating in groups and is taking her medications with no reported side effects. Sleep: Poor  Appetite:  Poor  Past Psychiatric History: see h&P Family History:  Family History   Problem Relation Age of Onset   Coronary artery disease Mother    Leukemia Father    Social History:  Social History   Substance and Sexual Activity  Alcohol Use Yes   Alcohol/week: 2.0 standard drinks of alcohol   Types: 1 Cans of beer, 1 Standard drinks or equivalent per week   Comment: occ     Social History   Substance and Sexual Activity  Drug Use Yes   Frequency: 1.0 times per week   Types: Cocaine   Comment: Occassional use    Social History   Socioeconomic History   Marital status: Widowed    Spouse name: Not on file   Number of children: Not on file   Years of education: Not on file   Highest education level: Not on file  Occupational History   Occupation: unemployed  Tobacco Use   Smoking status: Every Day    Current packs/day: 0.70    Average packs/day: 0.7 packs/day for 45.0 years (31.5 ttl pk-yrs)    Types: Cigarettes   Smokeless tobacco: Never   Tobacco comments:    wanting "some cinnamon" to help her stop  Vaping Use   Vaping status: Never Used  Substance and Sexual Activity   Alcohol use: Yes    Alcohol/week: 2.0 standard drinks of alcohol    Types: 1 Cans of beer, 1 Standard drinks or equivalent per week    Comment: occ   Drug use: Yes    Frequency: 1.0 times per week    Types: Cocaine  Comment: Occassional use   Sexual activity: Not Currently    Partners: Male    Comment: declined condoms  Other Topics Concern   Not on file  Social History Narrative   Not on file   Social Drivers of Health   Financial Resource Strain: Not on file  Food Insecurity: Patient Declined (04/30/2023)   Hunger Vital Sign    Worried About Running Out of Food in the Last Year: Patient declined    Ran Out of Food in the Last Year: Patient declined  Transportation Needs: No Transportation Needs (04/30/2023)   PRAPARE - Administrator, Civil Service (Medical): No    Lack of Transportation (Non-Medical): No  Physical Activity: Not on file  Stress:  Not on file  Social Connections: Moderately Isolated (04/30/2023)   Social Connection and Isolation Panel [NHANES]    Frequency of Communication with Friends and Family: More than three times a week    Frequency of Social Gatherings with Friends and Family: More than three times a week    Attends Religious Services: 1 to 4 times per year    Active Member of [employer] or Organizations: No    Attends Banker Meetings: Never    Marital Status: Widowed   Past Medical History:  Past Medical History:  Diagnosis Date   Asthma    Bipolar 1 disorder (HCC) 2009   Depression    GERD (gastroesophageal reflux disease)    HIV (human immunodeficiency virus infection) (HCC)    Hypertension    TB (pulmonary tuberculosis) 1989   was exposed and treated   UTI (urinary tract infection) 1997    Past Surgical History:  Procedure Laterality Date   ABDOMINAL HYSTERECTOMY      Current Medications: Current Facility-Administered Medications  Medication Dose Route Frequency Provider Last Rate Last Admin   acetaminophen (TYLENOL) tablet 650 mg  650 mg Oral Q6H PRN Onuoha, Chinwendu V, NP   650 mg at 05/04/23 0903   alum & mag hydroxide-simeth (MAALOX/MYLANTA) 200-200-20 MG/5ML suspension 15 mL  15 mL Oral Q6H PRN Onuoha, Chinwendu V, NP       amLODipine (NORVASC) tablet 10 mg  10 mg Oral QHS McLauchlin, Angela, NP   10 mg at 05/03/23 2133   atorvastatin (LIPITOR) tablet 40 mg  40 mg Oral QHS McLauchlin, Angela, NP   40 mg at 05/03/23 2132   bictegravir-emtricitabine-tenofovir AF (BIKTARVY) 50-200-25 MG per tablet 1 tablet  1 tablet Oral Daily McLauchlin, Angela, NP   1 tablet at 05/04/23 0904   DULoxetine (CYMBALTA) DR capsule 60 mg  60 mg Oral Daily Abagale Boulos, MD   60 mg at 05/04/23 1610   folic acid (FOLVITE) tablet 1 mg  1 mg Oral Daily Ademide Schaberg, MD   1 mg at 05/04/23 0903   hydrOXYzine (ATARAX) tablet 50 mg  50 mg Oral Q6H PRN Daila Elbert, MD   50 mg at 05/04/23 1606    irbesartan (AVAPRO) tablet 300 mg  300 mg Oral Daily McLauchlin, Angela, NP   300 mg at 05/04/23 0904   magnesium hydroxide (MILK OF MAGNESIA) suspension 15 mL  15 mL Oral Daily PRN Onuoha, Chinwendu V, NP       multivitamin with minerals tablet 1 tablet  1 tablet Oral Daily Naiomy Watters, MD   1 tablet at 05/04/23 0903   nicotine polacrilex (NICORETTE) gum 2 mg  2 mg Oral PRN Sreya Froio, MD       OLANZapine (ZYPREXA) injection 5  mg  5 mg Intramuscular TID PRN Onuoha, Chinwendu V, NP       OLANZapine zydis (ZYPREXA) disintegrating tablet 5 mg  5 mg Oral TID PRN Onuoha, Chinwendu V, NP       QUEtiapine (SEROQUEL) tablet 100 mg  100 mg Oral QHS McLauchlin, Angela, NP   100 mg at 05/03/23 2134   thiamine (VITAMIN B1) tablet 100 mg  100 mg Oral Daily Macaila Tahir, MD   100 mg at 05/04/23 1610   Or   thiamine (VITAMIN B1) injection 100 mg  100 mg Intravenous Daily Flower Franko, MD        Lab Results: No results found for this or any previous visit (from the past 48 hours).  Blood Alcohol level:  Lab Results  Component Value Date   ETH <10 04/29/2023   ETH <10 04/05/2020    Metabolic Disorder Labs: Lab Results  Component Value Date   HGBA1C 5.6 04/05/2020   MPG 114.02 04/05/2020   No results found for: "PROLACTIN" Lab Results  Component Value Date   CHOL 183 03/03/2023   TRIG 126 03/03/2023   HDL 42 (L) 03/03/2023   CHOLHDL 4.4 03/03/2023   VLDL 61 (H) 04/05/2020   LDLCALC 117 (H) 03/03/2023   LDLCALC 127 (H) 06/06/2021    Physical Findings: AIMS:  , ,  ,  ,    CIWA:  CIWA-Ar Total: 0 COWS:      Psychiatric Specialty Exam:  Presentation  General Appearance:  Appropriate for Environment; Casual  Eye Contact: Fair  Speech: Clear and Coherent  Speech Volume: Normal    Mood and Affect  Mood: Anxious  Affect: Appropriate   Thought Process  Thought Processes: Coherent  Descriptions of Associations:Intact  Orientation:Full (Time, Place and  Person)  Thought Content:Logical  Hallucinations:Hallucinations: None  Ideas of Reference:None  Suicidal Thoughts:Suicidal Thoughts: No  Homicidal Thoughts:Homicidal Thoughts: No   Sensorium  Memory: Immediate Fair; Recent Fair; Remote Fair  Judgment: Fair  Insight: Fair   Art therapist  Concentration: Fair  Attention Span: Fair  Recall: Fair  Fund of Knowledge: Fair  Language:Fair   Psychomotor Activity  Psychomotor Activity: Psychomotor Activity: Normal  Musculoskeletal: Strength & Muscle Tone: within normal limits Gait & Station: normal Assets  Assets: Manufacturing systems engineer; Desire for Improvement; Resilience; Social Support    Physical Exam: Physical Exam Vitals and nursing note reviewed.  HENT:     Head: Normocephalic.     Nose: Nose normal.     Mouth/Throat:     Mouth: Mucous membranes are moist.  Cardiovascular:     Rate and Rhythm: Normal rate.     Pulses: Normal pulses.  Pulmonary:     Effort: Pulmonary effort is normal.  Abdominal:     General: Bowel sounds are normal.  Skin:    General: Skin is warm.  Neurological:     General: No focal deficit present.     Mental Status: She is alert.    Review of Systems  Constitutional: Negative.   HENT: Negative.    Eyes: Negative.   Cardiovascular: Negative.   Skin: Negative.   Neurological: Negative.    Blood pressure 131/76, pulse (!) 57, temperature (!) 97.2 F (36.2 C), resp. rate 16, height 4\' 11"  (1.499 m), weight 57.6 kg, SpO2 97%. Body mass index is 25.65 kg/m.  Diagnosis: Principal Problem:   MDD (major depressive disorder), recurrent episode, severe (HCC) Alcohol use disorder, Severe, with withdrawals  Clinical Decision Making: Patient admitted for worsening depression with  suicidal ideation in the context of multiple psychosocial stressors, death anniversary of her husband, her daughter and granddaughter living at the house having family conflict, substance use.   Patient is hospitalized for stabilization.  Throughout her admission stay behaviors patient alcohol is stable and ready for discharge   treatment Plan Summary:   Safety and Monitoring:             -- Voluntary admission to inpatient psychiatric unit for safety, stabilization and treatment             -- Daily contact with patient to assess and evaluate symptoms and progress in treatment             -- Patient's case to be discussed in multi-disciplinary team meeting             -- Observation Level: q15 minute checks             -- Vital signs:  q12 hours             -- Precautions: suicide, elopement, and assault   2. Psychiatric Diagnoses and Treatment:          Cymbalta  60 mg daily  Alcohol withdrawal-CIWA protocol with Ativan as needed-completed  Seroquel 100 mg nightly-medication   -- The risks/benefits/side-effects/alternatives to this medication were discussed in detail with the patient and time was given for questions. The patient consents to medication trial.                -- Metabolic profile and EKG monitoring obtained while on an atypical antipsychotic (BMI: Lipid Panel: HbgA1c: QTc:)              -- Encouraged patient to participate in unit milieu and in scheduled group therapies                            3. Medical Issues Being Addressed:   Alcohol withdrawal- on CIWA and ativan protocol   4. Discharge Planning:              -- Social work and case management to assist with discharge planning and identification of hospital follow-up needs prior to discharge             -- Estimated LOS: 2-3 days             -- Discharge Concerns: Need to establish a safety plan; Medication compliance and effectiveness             -- Discharge Goals: Return home with outpatient referrals follow ups   Physician Treatment Plan for Primary Diagnosis: MDD (major depressive disorder), recurrent episode, severe (HCC) Long Term Goal(s): Improvement in symptoms so as ready for discharge    Short Term Goals: Ability to identify changes in lifestyle to reduce recurrence of condition will improve, Ability to verbalize feelings will improve, Ability to disclose and discuss suicidal ideas, Ability to demonstrate self-control will improve, and Ability to identify and develop effective coping behaviors will improve   Physician Treatment Plan for Secondary Diagnosis: Principal Problem:   MDD (major depressive disorder), recurrent episode, severe (HCC)   Long Term Goal(s): Improvement in symptoms so as ready for discharge   Short Term Goals: Ability to identify changes in lifestyle to reduce recurrence of condition will improve, Ability to verbalize feelings will improve, Ability to disclose and discuss suicidal ideas, Ability to demonstrate self-control will improve, Ability to identify and develop effective coping behaviors  will improve, Ability to maintain clinical measurements within normal limits will improve, Compliance with prescribed medications will improve, and Ability to identify triggers associated with substance abuse/mental health issues will improve  Aurelia Blotter, MD 05/04/2023, 7:46 PM

## 2023-05-05 DIAGNOSIS — F333 Major depressive disorder, recurrent, severe with psychotic symptoms: Secondary | ICD-10-CM | POA: Diagnosis not present

## 2023-05-05 NOTE — Progress Notes (Signed)
  [facility] Adult Case Management Discharge Plan :  Will you be returning to the same living situation after discharge:  Yes,  pt reports that she is returning home. At discharge, do you have transportation home?: Yes,  CSW to assist with transportation needs.  Do you have the ability to pay for your medications: Yes,  AMBETTER / AMBETTER OON  Release of information consent forms completed and in the chart;  Patient's signature needed at discharge.  Patient to Follow up at:  Follow-up Information     [facility] Follow up.   Specialty: Urgent Care Why: Appointment is scheduled for 06/05/2023 at 1PM for therapy with Paige.  Appointment for medication managment is 06/10/2023 at 9:30Am with Dr. Lorna Rose.  These are the earliest appointment you can walk in Wednesdays to see Germain Kohler and Thursdays to see Dr. Lorna Rose, both days you will need to be there by 7AM.  Correct phone number is [phone], follow prompts for outpatient services. Contact information: [phone] [place]  [phone]                Next level of care provider has access to [facility] Link:no  Safety Planning and Suicide Prevention discussed: Yes,  SPE completed with the patient.  Patient declined collateral contact.      Has patient been referred to the Quitline?: Patient refused referral for treatment  Patient has been referred for addiction treatment: Patient refused referral for treatment.  Larri Ply, LCSW 05/05/2023, 9:04 AM

## 2023-05-05 NOTE — [facility] Suicide Risk Assessment (Signed)
 [facility] INPATIENT:  Family/Significant Other Suicide Prevention Education  Suicide Prevention Education:  Patient Refusal for Family/Significant Other Suicide Prevention Education: The [name] has refused to provide written consent for family/significant other to be provided Family/Significant Other Suicide Prevention Education during admission and/or prior to discharge.  Physician notified. SPE completed with pt, as pt refused to consent to family contact. SPI pamphlet provided to pt and pt was encouraged to share information with support network, ask questions, and talk about any concerns relating to SPE. Pt denies access to guns/firearms and verbalized understanding of information provided. Mobile Crisis information also provided to pt.    Larri Ply 05/05/2023, 9:07 AM

## 2023-05-05 NOTE — Discharge Summary (Signed)
 Physician Discharge Summary Note  Patient:  Bobbiejo Ishikawa is an 63 y.o., female MRN:  [id number] DOB:  02/07/1960 Patient phone:  [phone] (home)  Patient address:   [address] [place] [address],    Date of Admission:  04/30/2023 Date of Discharge: 05/05/23  Reason for Admission:  Pt was brought from her home to [facility]. Pt said that she was "tired of living." She says that "I can't get it right.' She lives with her daughter and her family in her home and has to pay rent. Pt husband died Jun 07, 2019. She said she would overdose on medications. She wants to get an apartment of her own but her credit is bad. Patient has chronic health problems and back pain. She denies any HI. She has some tactile hallucinations when she lays down "I can feel something inside the bed." Patient last used cocaine (snorting) two days ago. Pt also purchases percocet from a neighbor occasionally. Pt says she last used ETOH on Saturday (04/05) and drank 4oz of tequila. Patient is admitted to [facility] unit with Q15 min safety monitoring. Multidisciplinary team approach is offered. Medication management; group/milieu therapy is offered.   Principal Problem: MDD (major depressive disorder), recurrent episode, severe (HCC) Discharge Diagnoses: Principal Problem:   MDD (major depressive disorder), recurrent episode, severe (HCC)   Past Psychiatric History: see h&P  Family Psychiatric  History: see h&p Social History:  Social History   Substance and Sexual Activity  Alcohol Use Yes   Alcohol/week: 2.0 standard drinks of alcohol   Types: 1 Cans of beer, 1 Standard drinks or equivalent per week   Comment: occ     Social History   Substance and Sexual Activity  Drug Use Yes   Frequency: 1.0 times per week   Types: Cocaine   Comment: Occassional use    Social History   Socioeconomic History   Marital status: Widowed    Spouse name: Not on file   Number of children: Not on file    Years of education: Not on file   Highest education level: Not on file  Occupational History   Occupation: unemployed  Tobacco Use   Smoking status: Every Day    Current packs/day: 0.70    Average packs/day: 0.7 packs/day for 45.0 years (31.5 ttl pk-yrs)    Types: Cigarettes   Smokeless tobacco: Never   Tobacco comments:    wanting "some cinnamon" to help her stop  Vaping Use   Vaping status: Never Used  Substance and Sexual Activity   Alcohol use: Yes    Alcohol/week: 2.0 standard drinks of alcohol    Types: 1 Cans of beer, 1 Standard drinks or equivalent per week    Comment: occ   Drug use: Yes    Frequency: 1.0 times per week    Types: Cocaine    Comment: Occassional use   Sexual activity: Not Currently    Partners: Male    Comment: declined condoms  Other Topics Concern   Not on file  Social History Narrative   Not on file   Social Drivers of Health   Financial Resource Strain: Not on file  Food Insecurity: Patient Declined (04/30/2023)   Hunger Vital Sign    Worried About Running Out of Food in the Last Year: Patient declined    Ran Out of Food in the Last Year: Patient declined  Transportation Needs: No Transportation Needs (04/30/2023)   PRAPARE - Administrator, Civil Service (  Medical): No    Lack of Transportation (Non-Medical): No  Physical Activity: Not on file  Stress: Not on file  Social Connections: Moderately Isolated (04/30/2023)   Social Connection and Isolation Panel [NHANES]    Frequency of Communication with Friends and Family: More than three times a week    Frequency of Social Gatherings with Friends and Family: More than three times a week    Attends Religious Services: 1 to 4 times per year    Active Member of [employer] or Organizations: No    Attends Banker Meetings: Never    Marital Status: Widowed   Past Medical History:  Past Medical History:  Diagnosis Date   Asthma    Bipolar 1 disorder (HCC) 05-25-07   Depression     GERD (gastroesophageal reflux disease)    HIV (human immunodeficiency virus infection) (HCC)    Hypertension    TB (pulmonary tuberculosis) 1989   was exposed and treated   UTI (urinary tract infection) 1997    Past Surgical History:  Procedure Laterality Date   ABDOMINAL HYSTERECTOMY     Family History:  Family History  Problem Relation Age of Onset   Coronary artery disease Mother    Leukemia Father     Hospital Course:  Pt was brought from her home to [facility]. Pt said that she was "tired of living." She says that "I can't get it right.' She lives with her daughter and her family in her home and has to pay rent. Pt husband died 05/25/2019. She said she would overdose on medications. She wants to get an apartment of her own but her credit is bad. Patient has chronic health problems and back pain. She denies any HI. She has some tactile hallucinations when she lays down "I can feel something inside the bed." Patient last used cocaine (snorting) two days ago. Pt also purchases percocet from a neighbor occasionally. Pt says she last used ETOH on Saturday (04/05) and drank 4oz of tequila. Patient is admitted to [facility] unit with Q15 min safety monitoring. Multidisciplinary team approach is offered. Medication management; group/milieu therapy is offered.  Patient patient was started on CIWA protocol with Ativan as as needed.  Day 1 she scored CIWA of 13 and eventually her score went down to 0 with minimal use of Ativan.  She was started on Cymbalta 30 mg daily to help with depression and titrated up to 60 mg daily with good response.  Patient tolerated medications with no reported side effects.  She continues to take her Seroquel 100 mg nightly.  Patient reports having psychiatric appointments set in May and she is planning to go as a walk-in after discharge.  Throughout the hospitalization patient displayed safe behaviors and participated in groups.  On the day of discharge she continued to deny  SI/HI/intent/plan, denied auditory/visual hallucinations.  No access to firearms in the house.  She has good support through her daughter in the house.  Patient is discharged home with outpatient follow-up.  Physical Findings: AIMS:  , ,  ,  ,    CIWA:  CIWA-Ar Total: 0 COWS:        Psychiatric Specialty Exam:  Presentation  General Appearance:  Appropriate for Environment; Casual  Eye Contact: Fair  Speech: Clear and Coherent  Speech Volume: Normal    Mood and Affect  Mood: Euthymic  Affect: Appropriate   Thought Process  Thought Processes: Coherent  Descriptions of Associations:Intact  Orientation:Full (Time, Place and Person)  Thought Content:Logical  Hallucinations:Hallucinations: None  Ideas of Reference:None  Suicidal Thoughts:Suicidal Thoughts: No  Homicidal Thoughts:Homicidal Thoughts: No   Sensorium  Memory: Immediate Fair; Recent Fair; Remote Fair  Judgment: Fair  Insight: Fair   Art therapist  Concentration: Fair  Attention Span: Fair  Recall: [employer] of Knowledge: Fair  Language: Fair   Psychomotor Activity  Psychomotor Activity: Psychomotor Activity: Normal  Musculoskeletal: Strength & Muscle Tone: within normal limits Gait & Station: normal Assets  Assets: Manufacturing systems engineer; Desire for Improvement; Resilience; Social Support   Sleep  Sleep: Sleep: Fair    Physical Exam: Physical Exam Vitals and nursing note reviewed.  HENT:     Head: Normocephalic.     Right Ear: Tympanic membrane normal.     Nose: Nose normal.     Mouth/Throat:     Mouth: Mucous membranes are moist.  Eyes:     Pupils: Pupils are equal, round, and reactive to light.  Cardiovascular:     Rate and Rhythm: Normal rate.     Pulses: Normal pulses.  Pulmonary:     Effort: Pulmonary effort is normal.  Abdominal:     General: Bowel sounds are normal.  Musculoskeletal:     Cervical back: Normal range of motion.   Skin:    General: Skin is warm.  Neurological:     General: No focal deficit present.     Mental Status: She is alert.    Review of Systems  Constitutional: Negative.   HENT: Negative.    Eyes: Negative.   Cardiovascular: Negative.   Gastrointestinal: Negative.   Skin: Negative.   Neurological: Negative.    Blood pressure 128/75, pulse (!) 58, temperature 97.9 F (36.6 C), resp. rate 18, height 4\' 11"  (1.499 m), weight 57.6 kg, SpO2 98%. Body mass index is 25.65 kg/m.   Social History   Tobacco Use  Smoking Status Every Day   Current packs/day: 0.70   Average packs/day: 0.7 packs/day for 45.0 years (31.5 ttl pk-yrs)   Types: Cigarettes  Smokeless Tobacco Never  Tobacco Comments   wanting "some cinnamon" to help her stop   Tobacco Cessation:  N/A, patient does not currently use tobacco products   Blood Alcohol level:  Lab Results  Component Value Date   ETH <10 04/29/2023   ETH <10 04/05/2020    Metabolic Disorder Labs:  Lab Results  Component Value Date   HGBA1C 5.6 04/05/2020   MPG 114.02 04/05/2020   No results found for: "PROLACTIN" Lab Results  Component Value Date   CHOL 183 03/03/2023   TRIG 126 03/03/2023   HDL 42 (L) 03/03/2023   CHOLHDL 4.4 03/03/2023   VLDL 61 (H) 04/05/2020   LDLCALC 117 (H) 03/03/2023   LDLCALC 127 (H) 06/06/2021    See Psychiatric Specialty Exam and Suicide Risk Assessment completed by Attending Physician prior to discharge.  Discharge destination:  Home  Is patient on multiple antipsychotic therapies at discharge:  No   Has Patient had three or more failed trials of antipsychotic monotherapy by history:  No  Recommended Plan for Multiple Antipsychotic Therapies: NA   Allergies as of 05/05/2023   No Known Allergies      Medication List     STOP taking these medications    olmesartan 40 MG tablet Commonly known as: BENICAR Replaced by: irbesartan 300 MG tablet   sodium chloride 0.65 % Soln nasal  spray Commonly known as: Therapist, nutritional  TAKE these medications      Indication  Aleve 220 MG tablet Generic drug: naproxen sodium Take 220-440 mg by mouth 2 (two) times daily as needed (for headaches).    amLODipine 10 MG tablet Commonly known as: NORVASC Take 1 tablet (10 mg total) by mouth at bedtime.    atorvastatin 40 MG tablet Commonly known as: LIPITOR Take 1 tablet (40 mg total) by mouth at bedtime.    bictegravir-emtricitabine-tenofovir AF 50-200-25 MG Tabs tablet Commonly known as: BIKTARVY Take 1 tablet by mouth daily. What changed: when to take this    DULoxetine 60 MG capsule Commonly known as: CYMBALTA Take 1 capsule (60 mg total) by mouth daily.    folic acid 1 MG tablet Commonly known as: FOLVITE Take 1 tablet (1 mg total) by mouth daily.    hydrOXYzine 50 MG tablet Commonly known as: ATARAX Take 1 tablet (50 mg total) by mouth every 6 (six) hours as needed for anxiety.    irbesartan 300 MG tablet Commonly known as: AVAPRO Take 1 tablet (300 mg total) by mouth daily. Replaces: olmesartan 40 MG tablet    meloxicam 15 MG tablet Commonly known as: MOBIC One tab PO qAM with breakfast for 2 weeks, then daily prn pain. What changed:  how much to take how to take this when to take this additional instructions    multivitamin with minerals Tabs tablet Take 1 tablet by mouth daily.    QUEtiapine 100 MG tablet Commonly known as: SEROquel Take 1 tablet (100 mg total) by mouth at bedtime. What changed:  medication strength how much to take additional instructions Another medication with the same name was removed. Continue taking this medication, and follow the directions you see here.    thiamine 100 MG tablet Commonly known as: Vitamin B-1 Take 1 tablet (100 mg total) by mouth daily.         Follow-up Information     [facility] Follow up.   Specialty: Urgent Care Why: Appointment is  scheduled for 06/05/2023 at 1PM for therapy with Paige.  Appointment for medication managment is 06/10/2023 at 9:30Am with Dr. Lorna Rose.  These are the earliest appointment you can walk in Wednesdays to see Germain Kohler and Thursdays to see Dr. Lorna Rose, both days you will need to be there by 7AM.  Correct phone number is [phone], follow prompts for outpatient services. Contact information: [address]   [address] [phone]                Follow-up recommendations:  Activity:  As tolerated    Signed: Kadence Mikkelson, MD 05/05/2023, 10:03 PM

## 2023-05-05 NOTE — Plan of Care (Addendum)
 Patient is scheduled for discharge today. Denies SI, HI, AVH, depression and anxiety.  Discharge instructions reviewed with patient and clothing list affirmed by patient as being complete.  Will continue to monitor. 9:20 - cab arrived and patient escorted by [facility] to the medical mall exit with belongings and paperwork for discharge.

## 2023-05-05 NOTE — Plan of Care (Signed)

## 2023-05-05 NOTE — Progress Notes (Signed)
   05/04/23 2100  Psych Admission Type (Psych Patients Only)  Admission Status Involuntary  Psychosocial Assessment  Patient Complaints None  Eye Contact Fair  Facial Expression Sad  Affect Flat  Speech Logical/coherent  Interaction Assertive  Motor Activity Slow  Appearance/Hygiene Unremarkable  Behavior Characteristics Cooperative  Mood Pleasant  Thought Process  Coherency WDL  Content WDL  Delusions None reported or observed  Perception WDL  Hallucination None reported or observed  Judgment Impaired  Confusion None  Danger to Self  Current suicidal ideation? Denies   Pleasant and cooperative. States she's ready to home and back to work. Interacting with peers and staff. Attends group. Po med compliant. No behavior issues noted. Denies SI/HI/AVH. No c/o pain/discomfort noted.

## 2023-05-14 ENCOUNTER — Ambulatory Visit
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
## 2023-05-21 ENCOUNTER — Other Ambulatory Visit: Payer: Self-pay | Admitting: Pharmacist

## 2023-05-21 DIAGNOSIS — B20 Human immunodeficiency virus [HIV] disease: Secondary | ICD-10-CM

## 2023-05-21 MED ORDER — BICTEGRAVIR-EMTRICITAB-TENOFOV 50-200-25 MG PO TABS: 1.0000 | ORAL_TABLET | Freq: Every day | ORAL | Status: AC

## 2023-05-21 NOTE — Progress Notes (Signed)
 Medication Samples have been provided to the patient.  Drug name: Biktarvy        Strength: 50/200/25 mg       Qty: 7 tablets (1 bottles) LOT: CTGMDA   Exp.Date: 7/27  Samples requested by Arlon Bergamo, RN.  Dosing instructions: Take one tablet by mouth once daily  The patient has been instructed regarding the correct time, dose, and frequency of taking this medication, including desired effects and most common side effects.   Nicklas Barns, PharmD, CPP, BCIDP, AAHIVP Clinical Pharmacist Practitioner Infectious Diseases Clinical Pharmacist [facility]

## 2023-05-27 ENCOUNTER — Ambulatory Visit: Payer: Medicaid Other | Admitting: Internal Medicine

## 2023-06-05 ENCOUNTER — Ambulatory Visit (HOSPITAL_COMMUNITY): Admitting: Clinical

## 2023-06-05 ENCOUNTER — Encounter (HOSPITAL_COMMUNITY): Payer: Self-pay

## 2023-06-05 NOTE — Progress Notes (Signed)
 The 10-year ASCVD risk score (Arnett DK, et al., 2019) is: 15.9%   Values used to calculate the score:     Age: 63 years     Sex: Female     Is Non-Hispanic African American: Yes     Diabetic: No     Tobacco smoker: Yes     Systolic Blood Pressure: 128 mmHg     Is BP treated: Yes     HDL Cholesterol: 42 mg/dL     Total Cholesterol: 183 mg/dL  Currently prescribed atorvastatin  40 mg.   Tyjah Hai, BSN, RN

## 2023-06-10 ENCOUNTER — Encounter (HOSPITAL_COMMUNITY): Admitting: Student

## 2023-06-17 ENCOUNTER — Telehealth: Payer: Self-pay

## 2023-06-17 NOTE — Telephone Encounter (Signed)
 Received signed medical release from [facility] in [place]. Medical release placed in medical records folder.   [facility] Address: [address] [place], [place], [place] [address] Phone: [phone]  Julien Odor, RMA

## 2023-07-15 ENCOUNTER — Telehealth (HOSPITAL_COMMUNITY): Payer: Self-pay

## 2023-07-15 NOTE — Telephone Encounter (Signed)
 Opened in Error     JNL, CMA

## 2023-07-22 ENCOUNTER — Ambulatory Visit (INDEPENDENT_AMBULATORY_CARE_PROVIDER_SITE_OTHER): Payer: MEDICAID | Admitting: Physician Assistant

## 2023-07-22 VITALS — BP 157/90 | HR 69 | Temp 98.1°F | Ht 59.0 in | Wt 140.2 lb

## 2023-07-22 DIAGNOSIS — F29 Unspecified psychosis not due to a substance or known physiological condition: Secondary | ICD-10-CM

## 2023-07-22 DIAGNOSIS — F332 Major depressive disorder, recurrent severe without psychotic features: Secondary | ICD-10-CM | POA: Diagnosis not present

## 2023-07-22 DIAGNOSIS — F4312 Post-traumatic stress disorder, chronic: Secondary | ICD-10-CM

## 2023-07-22 DIAGNOSIS — F411 Generalized anxiety disorder: Secondary | ICD-10-CM

## 2023-07-22 DIAGNOSIS — F4381 Prolonged grief disorder: Secondary | ICD-10-CM

## 2023-07-22 MED ORDER — QUETIAPINE FUMARATE 50 MG PO TABS
ORAL_TABLET | ORAL | 0 refills | Status: DC
Start: 2023-07-22 — End: 2023-12-05

## 2023-07-22 MED ORDER — DULOXETINE HCL 30 MG PO CPEP: ORAL_CAPSULE | ORAL | 0 refills | Status: DC

## 2023-07-22 MED ORDER — DULOXETINE HCL 60 MG PO CPEP: 60.0000 mg | ORAL_CAPSULE | Freq: Every day | ORAL | 2 refills | Status: DC

## 2023-07-22 MED ORDER — HYDROXYZINE HCL 50 MG PO TABS
50.0000 mg | ORAL_TABLET | Freq: Four times a day (QID) | ORAL | 1 refills | Status: AC | PRN
Start: 2023-07-22 — End: ?

## 2023-07-22 MED ORDER — QUETIAPINE FUMARATE 100 MG PO TABS: 100.0000 mg | ORAL_TABLET | Freq: Every day | ORAL | 2 refills | Status: DC

## 2023-07-23 ENCOUNTER — Other Ambulatory Visit (HOSPITAL_COMMUNITY): Payer: Self-pay

## 2023-07-23 ENCOUNTER — Telehealth (HOSPITAL_COMMUNITY): Payer: Self-pay

## 2023-07-23 NOTE — Telephone Encounter (Signed)
 Patient called in reporting she is unable to get her medications from pharmacy due to additional documents required by insurance from provider.   A call was placed to [facility] pharmacy for clarification and assistance.  Pharmacy Staff was able to override documents for patient to retrieve medications. Pharmacy will send over directions for the documents that need to be filled out.   A call was placed to patient to inform her that pharmacy is filling her order.
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
## 2023-07-31 ENCOUNTER — Encounter: Payer: Self-pay | Admitting: Internal Medicine

## 2023-07-31 ENCOUNTER — Other Ambulatory Visit: Payer: Self-pay

## 2023-07-31 ENCOUNTER — Ambulatory Visit: Payer: MEDICAID | Admitting: Internal Medicine

## 2023-07-31 VITALS — BP 129/72 | HR 68 | Temp 97.9°F | Ht 59.0 in | Wt 140.0 lb

## 2023-07-31 DIAGNOSIS — F1010 Alcohol abuse, uncomplicated: Secondary | ICD-10-CM

## 2023-07-31 DIAGNOSIS — F4321 Adjustment disorder with depressed mood: Secondary | ICD-10-CM | POA: Diagnosis not present

## 2023-07-31 DIAGNOSIS — B20 Human immunodeficiency virus [HIV] disease: Secondary | ICD-10-CM

## 2023-07-31 DIAGNOSIS — Z8619 Personal history of other infectious and parasitic diseases: Secondary | ICD-10-CM

## 2023-07-31 DIAGNOSIS — F1410 Cocaine abuse, uncomplicated: Secondary | ICD-10-CM

## 2023-07-31 DIAGNOSIS — Z111 Encounter for screening for respiratory tuberculosis: Secondary | ICD-10-CM

## 2023-07-31 DIAGNOSIS — Z113 Encounter for screening for infections with a predominantly sexual mode of transmission: Secondary | ICD-10-CM

## 2023-07-31 DIAGNOSIS — I2510 Atherosclerotic heart disease of native coronary artery without angina pectoris: Secondary | ICD-10-CM | POA: Diagnosis not present

## 2023-07-31 DIAGNOSIS — F32A Depression, unspecified: Secondary | ICD-10-CM

## 2023-07-31 DIAGNOSIS — Z5900 Homelessness unspecified: Secondary | ICD-10-CM

## 2023-07-31 DIAGNOSIS — Z1159 Encounter for screening for other viral diseases: Secondary | ICD-10-CM

## 2023-07-31 MED ORDER — ATORVASTATIN CALCIUM 40 MG PO TABS: 40.0000 mg | ORAL_TABLET | Freq: Every day | ORAL | 8 refills | Status: DC

## 2023-07-31 MED ORDER — IRBESARTAN 300 MG PO TABS: 300.0000 mg | ORAL_TABLET | Freq: Every day | ORAL | 3 refills | Status: DC

## 2023-07-31 MED ORDER — AMLODIPINE BESYLATE 10 MG PO TABS: 10.0000 mg | ORAL_TABLET | Freq: Every day | ORAL | 3 refills | Status: DC

## 2023-07-31 MED ORDER — BICTEGRAVIR-EMTRICITAB-TENOFOV 50-200-25 MG PO TABS: 1.0000 | ORAL_TABLET | Freq: Every day | ORAL | 8 refills | Status: AC

## 2023-07-31 NOTE — Patient Instructions (Addendum)
 Std testing and hiv testing today   Hepatitis c testing too     See me in 3 months  Continue to follow psychiatry as you have been  Renewal for biktarvy , blood pressure meds (avapro  and norvasc ) and lipitor placed  See your pcp this year       We placed a referral to [facility] to see if they can help with housing. You can call [phone] to follow up.   Mental Health Resources  988: can call or text 24/7  [facility]: Address: [address] [place], [place] [address] Open 24 hours Phone: [phone]  [facility] [place]: Address: [address] [place], [place] [address] Phone: [phone] Appointments: [email or web address]  Here are a list of behavioral health providers that accept Medicaid:   [facility]: [phone] [address], [place], [place] [address]  [facility]: [phone] [address], [place], [place] [address]  [facility]: [phone] [address], [place], [place] [address]  [facility] [place]: [phone] [facility] [address] [place], [place] [address]  [facility] [address] [place], [place] [address]   Smoking Cessation: QuitlineNC 1-800-QUIT-NOW [phone]); Espaol: 1-855-Djelo-Ya (1-(607)185-9734) [email or web address]

## 2023-07-31 NOTE — Progress Notes (Signed)
 [facility]   Cc: hiv f/u  HPI: Daisy Salinas is a 63 y.o. female presents for HIV management f/u  02/2023 first rcid visit with me: #hiv Previously seen dr Elaine; last seen by Dr Dennise on 10/02/22 Dx'ed 1989s Cd4 nadir unknown but said she never took prophylaxis Hx OI - none Therapy: Started therapy late 1990s Started tx early 2000s with Atripla   --> 08-23-14 tivicay /truvada --> 08-23-2014 genvoya  --> Aug 22, 2021 biktarvy  Doesn't appear any virologic failure  Ran out of biktarvy  for the past month  She hasn't taken other medications for 8 months   #hx hep c Treated with harvoni in 2014-08-23; cured  #hx syphilis Previous treated 1970s when she was first treated as late latent. Had it again 1990s treated Titer stable 1:1 serofast since 08/23/15  #social Sexually active but not in any relationship Lives in [place] alone Husband passed away Aug 23, 2019  Still smoke 1p/2days None of her kids have hiv Part time work Conservation officer, nature Hx substance - intranasal cocaine; distant hx iv Smokes weed every now and then  Still snorted cocaine here and there Occasional etoh  Other medical problem #depression #htn #tobacco use #copd -- not on oxygen  --------------- 07/31/23 id clinic f/u Brother dying/hospice at home Patient very depressed; suicidal thoughts but no concrete plan -- chronic depression; does know of resource to call She recently relapsed in crack cocaine/alcohol after 16 years, and is living in her car. Trying to stay clean She had thought of detox to help with housing/transition but would like to spend time with her brother Compliant with biktarvy  no missed dose Also routinely sees her pcp in [place] Saw psychiatry last week  Past Medical History:  Diagnosis Date   Asthma    Bipolar 1 disorder (HCC) 2007-08-23   Depression    GERD (gastroesophageal reflux disease)    HIV (human immunodeficiency virus infection) (HCC)    Hypertension    TB (pulmonary  tuberculosis) 1989   was exposed and treated   UTI (urinary tract infection) 1997    Past Surgical History:  Procedure Laterality Date   ABDOMINAL HYSTERECTOMY      Family History  Problem Relation Age of Onset   Coronary artery disease Mother    Leukemia Father    Current Outpatient Medications on File Prior to Visit  Medication Sig Dispense Refill   ALEVE  220 MG tablet Take 220-440 mg by mouth 2 (two) times daily as needed (for headaches).     amLODipine  (NORVASC ) 10 MG tablet Take 1 tablet (10 mg total) by mouth at bedtime. 30 tablet 0   atorvastatin  (LIPITOR) 40 MG tablet Take 1 tablet (40 mg total) by mouth at bedtime. 30 tablet 0   bictegravir-emtricitabine -tenofovir  AF (BIKTARVY ) 50-200-25 MG TABS tablet Take 1 tablet by mouth daily. 30 tablet 0   DULoxetine  (CYMBALTA ) 30 MG capsule Patient to take cymbalta  30 mg for 6 days before titrating to 60 mg daily. 6 capsule 0   DULoxetine  (CYMBALTA ) 60 MG capsule Take 1 capsule (60 mg total) by mouth daily. 30 capsule 2   folic acid  (FOLVITE ) 1 MG tablet Take 1 tablet (1 mg total) by mouth daily. 30 tablet 0   hydrOXYzine  (ATARAX ) 50 MG tablet Take 1 tablet (50 mg total) by mouth every 6 (six) hours as needed for anxiety. 120 tablet 1   irbesartan  (AVAPRO ) 300 MG tablet Take 1 tablet (300 mg total) by mouth daily. 30 tablet 0   meloxicam  (  MOBIC ) 15 MG tablet One tab PO qAM with breakfast for 2 weeks, then daily prn pain. 30 tablet 0   Multiple Vitamin (MULTIVITAMIN WITH MINERALS) TABS tablet Take 1 tablet by mouth daily. 30 tablet 0   QUEtiapine  (SEROQUEL ) 100 MG tablet Take 1 tablet (100 mg total) by mouth at bedtime. 30 tablet 2   QUEtiapine  (SEROQUEL ) 50 MG tablet Patient to take Seroquel  50 mg at bedtime for 4 days before titrating to 100 mg at bedtime. 4 tablet 0   thiamine  (VITAMIN B-1) 100 MG tablet Take 1 tablet (100 mg total) by mouth daily. 30 tablet 0   No current facility-administered medications on file prior to visit.     No Known Allergies    Lab Results HIV 1 RNA Quant (Copies/mL)  Date Value  03/03/2023 Not Detected  09/18/2022 29 (H)  01/09/2022 <20 (H)   CD4 T Cell Abs (/uL)  Date Value  04/29/2023 1,762  03/03/2023 1,340  09/18/2022 1,808 (H)   Lab Results  Component Value Date   HIV1GENOSEQ REPORT 01/12/2014   Lab Results  Component Value Date   WBC 8.7 04/29/2023   HGB 13.5 04/29/2023   HCT 39.9 04/29/2023   MCV 99.3 04/29/2023   PLT 154 04/29/2023    Lab Results  Component Value Date   CREATININE 0.64 04/30/2023   BUN 12 04/30/2023   NA 139 04/30/2023   K 3.1 (L) 04/30/2023   CL 105 04/30/2023   CO2 25 04/30/2023   Lab Results  Component Value Date   ALT 11 04/29/2023   AST 29 04/29/2023   ALKPHOS 61 04/29/2023   BILITOT 0.3 04/29/2023    Lab Results  Component Value Date   CHOL 183 03/03/2023   TRIG 126 03/03/2023   HDL 42 (L) 03/03/2023   LDLCALC 117 (H) 03/03/2023   No results found for: HAV Lab Results  Component Value Date   HEPBSAG NON-REACTIVE 10/14/2019   HEPBSAB NON-REACTIVE 10/14/2019   Lab Results  Component Value Date   HCVAB YES 03/17/2006   Lab Results  Component Value Date   CHLAMYDIAWP Negative 03/03/2023   N Negative 03/03/2023   No results found for: GCPROBEAPT No results found for: QUANTGOLD  Assessment/Plan #hiv #cad (ascvd 15.9%)/reprieve Previously seen dr Elaine; last seen by Dr Dennise on 10/02/22 Dx'ed 1989s Cd4 nadir unknown but said she never took prophylaxis Hx OI - none Therapy: Started therapy late 1990s Started tx early 2000s with Atripla   --> 2016 tivicay /truvada --> 2016 genvoya  --> 2023 biktarvy  Doesn't appear any virologic failure  02/2023 Ran out of biktarvy  for the past month  She hasn't taken other medications for 8 months  Will restart biktarvy  -- rx sent to [facility] on [address] (medicaid patient)  07/31/23 no lapse in biktarvy ; compliant  -already on lipitor 40 mg daily and will  continue -discussed u=u -encourage compliance -continue current HIV medication -labs next time 3 months -f/u in 3 months   #depression #grief #substance abuse #homelessness Brother hospice/cancer Sister recently passed away Relapsed cocaine/etoh trying to stay clean  -seeing and advise f/u psych -sa but no active plan; safety plan discussed/agreed on -f/u 3 months with me -thp referral   #hx hep c Treated with harvoni in 2016; cured Snorted cocaine so will recheck next visit  -hep c rna pcr today    #hx syphilis Previous treated 1970s when she was first treated as late latent. Had it again 1990s treated Titer stable 1:1 serofast since 2017  -pending  rpr; gc/chlam negative   #htn/hlp #cad risk Does see pcp for these  -continue lipitor 40 mg; rx renewed -renewed her avapro  and amlodipine  -advise f/u pcp for htn/hlp and other medical problems/reengagement with meds   #hcm -vaccination Will review -hepatitis 2021 hep b cAb positive; sAg and sAb negative 07/2023 hcv rna ordered -std screen Hx syphilis; rpr titer serofast 1:1 last checked 02/2023 02/2023 Urine gc/chlam negative 07/2023 will check rpr/urine cytology; didn't discuss sexual hx in setting depression/griefs -tb screen Quantiferon gold today -women's health cancer screening F/u pcp         Constance ONEIDA Passer, MD [facility] [phone]  pager   [phone] cell 07/31/2023, 9:22 AM

## 2023-07-31 NOTE — Addendum Note (Signed)
 Addended by: GRETEL TULLY HERO on: 07/31/2023 10:21 AM   Modules accepted: Orders

## 2023-07-31 NOTE — Progress Notes (Signed)
 Housing referral placed to [facility].   ROI faxed to [facility]. P: [phone] F: [phone]  Duwaine Lowe, BSN, RN

## 2023-08-04 LAB — COMPLETE METABOLIC PANEL WITHOUT GFR
AG Ratio: 1.4 (calc) (ref 1.0–2.5)
ALT: 9 U/L (ref 6–29)
AST: 23 U/L (ref 10–35)
Albumin: 4.6 g/dL (ref 3.6–5.1)
Alkaline phosphatase (APISO): 76 U/L (ref 37–153)
BUN: 9 mg/dL (ref 7–25)
CO2: 22 mmol/L (ref 20–32)
Calcium: 9.2 mg/dL (ref 8.6–10.4)
Chloride: 104 mmol/L (ref 98–110)
Creat: 0.69 mg/dL (ref 0.50–1.05)
Globulin: 3.2 g/dL (ref 1.9–3.7)
Glucose, Bld: 149 mg/dL — ABNORMAL HIGH (ref 65–99)
Potassium: 3.5 mmol/L (ref 3.5–5.3)
Sodium: 136 mmol/L (ref 135–146)
Total Bilirubin: 0.7 mg/dL (ref 0.2–1.2)
Total Protein: 7.8 g/dL (ref 6.1–8.1)

## 2023-08-04 LAB — CBC
HCT: 38.7 % (ref 35.0–45.0)
Hemoglobin: 13.2 g/dL (ref 11.7–15.5)
MCH: 33.2 pg — ABNORMAL HIGH (ref 27.0–33.0)
MCHC: 34.1 g/dL (ref 32.0–36.0)
MCV: 97.2 fL (ref 80.0–100.0)
MPV: 10.8 fL (ref 7.5–12.5)
Platelets: 173 Thousand/uL (ref 140–400)
RBC: 3.98 Million/uL (ref 3.80–5.10)
RDW: 13.3 % (ref 11.0–15.0)
WBC: 7.6 Thousand/uL (ref 3.8–10.8)

## 2023-08-04 LAB — T PALLIDUM AB: T Pallidum Abs: POSITIVE — AB

## 2023-08-04 LAB — RPR: RPR Ser Ql: REACTIVE — AB

## 2023-08-04 LAB — HCV RNA, QUANT REAL-TIME PCR W/REFLEX
HCV RNA, PCR, QN (Log): <1.18 {Log_IU}/mL
HCV RNA, PCR, QN: <15 [IU]/mL

## 2023-08-04 LAB — QUANTIFERON-TB GOLD PLUS
Mitogen-NIL: 9.21 [IU]/mL
NIL: 0.03 [IU]/mL
QuantiFERON-TB Gold Plus: NEGATIVE
TB1-NIL: 0.33 [IU]/mL
TB2-NIL: 0.26 [IU]/mL

## 2023-08-04 LAB — RPR TITER: RPR Titer: 1:1 {titer} — ABNORMAL HIGH

## 2023-08-04 LAB — HIV-1 RNA QUANT-NO REFLEX-BLD
HIV 1 RNA Quant: NOT DETECTED {copies}/mL
HIV-1 RNA Quant, Log: NOT DETECTED {Log_copies}/mL

## 2023-08-06 ENCOUNTER — Ambulatory Visit: Payer: Self-pay | Admitting: Internal Medicine

## 2023-08-21 ENCOUNTER — Encounter (HOSPITAL_COMMUNITY): Payer: Self-pay | Admitting: Physician Assistant

## 2023-08-21 ENCOUNTER — Telehealth (HOSPITAL_COMMUNITY): Payer: Self-pay | Admitting: *Deleted

## 2023-08-21 NOTE — Telephone Encounter (Signed)
 Fax received for approval of PA for Quetiapine  100mg  until 08/20/24. Called to notify pharmacy.

## 2023-08-21 NOTE — Telephone Encounter (Signed)
 Fax received for PA of Quetiapine  100mg . Submitted online with cover my meds. Awaiting decision.

## 2023-08-28 ENCOUNTER — Encounter (INDEPENDENT_AMBULATORY_CARE_PROVIDER_SITE_OTHER): Payer: Self-pay | Admitting: Primary Care

## 2023-08-28 ENCOUNTER — Ambulatory Visit (INDEPENDENT_AMBULATORY_CARE_PROVIDER_SITE_OTHER): Payer: MEDICAID | Admitting: Primary Care

## 2023-08-28 VITALS — BP 127/82 | HR 68 | Resp 20 | Ht 59.0 in | Wt 146.8 lb

## 2023-08-28 DIAGNOSIS — Z5900 Homelessness unspecified: Secondary | ICD-10-CM | POA: Diagnosis not present

## 2023-08-28 DIAGNOSIS — B351 Tinea unguium: Secondary | ICD-10-CM

## 2023-08-28 DIAGNOSIS — I10 Essential (primary) hypertension: Secondary | ICD-10-CM

## 2023-08-28 DIAGNOSIS — F331 Major depressive disorder, recurrent, moderate: Secondary | ICD-10-CM

## 2023-08-28 DIAGNOSIS — Z1211 Encounter for screening for malignant neoplasm of colon: Secondary | ICD-10-CM

## 2023-08-28 NOTE — Progress Notes (Signed)
 [facility]  [name], is a 63 y.o. female  RDW:[id number]  FMW:[id number]  DOB - 05/24/1960  Chief Complaint  Patient presents with   Medical Management of Chronic Issues       Subjective:   Daisy Salinas is a 63 y.o. female here today for an acute visit. She is homeless and clean for 63 days stays in her car very depressed denies harm to self or others , visual or auditory hallucinations . Followed by [facility] and [facility]. She c/o feet peeling looked at toes    HPI  No problems updated.  Comprehensive ROS Pertinent positive and negative noted in HPI   No Known Allergies  Past Medical History:  Diagnosis Date   Asthma    Bipolar 1 disorder (HCC) 2009   Depression    GERD (gastroesophageal reflux disease)    HIV (human immunodeficiency virus infection) (HCC)    Hypertension    TB (pulmonary tuberculosis) 1989   was exposed and treated   UTI (urinary tract infection) 1997    Current Outpatient Medications on File Prior to Visit  Medication Sig Dispense Refill   ALEVE  220 MG tablet Take 220-440 mg by mouth 2 (two) times daily as needed (for headaches).     amLODipine  (NORVASC ) 10 MG tablet Take 1 tablet (10 mg total) by mouth at bedtime. 30 tablet 3   atorvastatin  (LIPITOR) 40 MG tablet Take 1 tablet (40 mg total) by mouth at bedtime. 30 tablet 8   bictegravir-emtricitabine -tenofovir  AF (BIKTARVY ) 50-200-25 MG TABS tablet Take 1 tablet by mouth daily. 30 tablet 8   DULoxetine  (CYMBALTA ) 60 MG capsule Take 1 capsule (60 mg total) by mouth daily. 30 capsule 2   hydrOXYzine  (ATARAX ) 50 MG tablet Take 1 tablet (50 mg total) by mouth every 6 (six) hours as needed for anxiety. 120 tablet 1   irbesartan  (AVAPRO ) 300 MG tablet Take 1 tablet (300 mg total) by mouth daily. 30 tablet 3   QUEtiapine  (SEROQUEL ) 100 MG tablet Take 1 tablet (100 mg total) by mouth at bedtime. 30 tablet 2   DULoxetine  (CYMBALTA ) 30 MG capsule Patient to take cymbalta  30 mg for 6  days before titrating to 60 mg daily. (Patient not taking: Reported on 08/28/2023) 6 capsule 0   folic acid  (FOLVITE ) 1 MG tablet Take 1 tablet (1 mg total) by mouth daily. (Patient not taking: Reported on 08/28/2023) 30 tablet 0   meloxicam  (MOBIC ) 15 MG tablet One tab PO qAM with breakfast for 2 weeks, then daily prn pain. (Patient not taking: Reported on 08/28/2023) 30 tablet 0   Multiple Vitamin (MULTIVITAMIN WITH MINERALS) TABS tablet Take 1 tablet by mouth daily. (Patient not taking: Reported on 08/28/2023) 30 tablet 0   QUEtiapine  (SEROQUEL ) 50 MG tablet Patient to take Seroquel  50 mg at bedtime for 4 days before titrating to 100 mg at bedtime. 4 tablet 0   thiamine  (VITAMIN B-1) 100 MG tablet Take 1 tablet (100 mg total) by mouth daily. (Patient not taking: Reported on 08/28/2023) 30 tablet 0   No current facility-administered medications on file prior to visit.   Health Maintenance  Topic Date Due   Zoster (Shingles) Vaccine (1 of 2) Never done   Pneumococcal Vaccine for high risk medical condition (3 of 3 - PCV) 12/01/2010   Pneumococcal Vaccine for age over 42 (3 of 3 - PCV) 12/01/2010   Colon Cancer Screening  06/23/2018   Screening for Lung Cancer  02/23/2022   COVID-19 Vaccine (4 -  2024-25 season) 04/01/2023   Flu Shot  04/20/2024*   Pap with HPV screening  08/27/2024*   Mammogram  03/23/2025   DTaP/Tdap/Td vaccine (2 - Td or Tdap) 10/08/2028   Hepatitis B Vaccine  Completed   Hepatitis C Screening  Completed   HIV Screening  Completed   HPV Vaccine  Aged Out   Meningitis B Vaccine  Aged Out  *Topic was postponed. The date shown is not the original due date.    Objective:   Vitals:   08/28/23 1443  BP: 127/82  Pulse: 68  Resp: 20  SpO2: 100%  Weight: 146 lb 12.8 oz (66.6 kg)  Height: 4' 11 (1.499 m)   BP Readings from Last 3 Encounters:  08/28/23 127/82  07/31/23 129/72  04/30/23 138/69      Physical Exam Vitals reviewed.  HENT:     Right Ear: External ear  normal.     Left Ear: External ear normal.  Eyes:     Extraocular Movements: Extraocular movements intact.  Cardiovascular:     Rate and Rhythm: Normal rate and regular rhythm.  Pulmonary:     Effort: Pulmonary effort is normal.     Breath sounds: Normal breath sounds.  Abdominal:     General: Bowel sounds are normal.     Palpations: Abdomen is soft.  Musculoskeletal:        General: Normal range of motion.     Cervical back: Normal range of motion.  Skin:    Comments: Feet peeling   Neurological:     Mental Status: She is alert and oriented to person, place, and time.  Psychiatric:     Comments: Depressed with life       Assessment & Plan  Madisyn was seen today for medical management of chronic issues.  Diagnoses and all orders for this visit:  Onychomycosis -     Ambulatory referral to Podiatry  Colon cancer screening -     Ambulatory referral to Gastroenterology  Moderate episode of recurrent major depressive disorder (HCC) Followed by [facility]  Homeless Daisy Salinas   Essential hypertension Well controlled meds being filled by Dr. Overton   Patient have been counseled extensively about nutrition and exercise. Other issues discussed during this visit include: low cholesterol diet, weight control and daily exercise, foot care, annual eye examinations at Ophthalmology, importance of adherence with medications and regular follow-up. We also discussed long term complications of uncontrolled diabetes and hypertension.   No follow-ups on file.  The patient was given clear instructions to go to ER or return to medical center if symptoms don't improve, worsen or new problems develop. The patient verbalized understanding. The patient was told to call to get lab results if they haven't heard anything in the next week.   This note has been created with Education officer, environmental. Any transcriptional errors are unintentional.   Rosaline SHAUNNA Bohr, NP 08/31/2023, 11:59 PM

## 2023-08-29 ENCOUNTER — Ambulatory Visit (HOSPITAL_COMMUNITY): Payer: MEDICAID | Admitting: Clinical

## 2023-08-29 ENCOUNTER — Encounter (HOSPITAL_COMMUNITY): Payer: Self-pay

## 2023-09-02 ENCOUNTER — Other Ambulatory Visit: Payer: Self-pay | Admitting: Family Medicine

## 2023-09-11 ENCOUNTER — Encounter (HOSPITAL_COMMUNITY): Payer: MEDICAID | Admitting: Physician Assistant

## 2023-09-15 ENCOUNTER — Other Ambulatory Visit (HOSPITAL_COMMUNITY): Payer: Self-pay | Admitting: Physician Assistant

## 2023-09-15 DIAGNOSIS — F411 Generalized anxiety disorder: Secondary | ICD-10-CM

## 2023-09-16 ENCOUNTER — Encounter: Payer: Self-pay | Admitting: Podiatry

## 2023-09-16 ENCOUNTER — Ambulatory Visit (INDEPENDENT_AMBULATORY_CARE_PROVIDER_SITE_OTHER): Payer: MEDICAID | Admitting: Podiatry

## 2023-09-16 VITALS — Ht 59.0 in | Wt 146.0 lb

## 2023-09-16 DIAGNOSIS — B351 Tinea unguium: Secondary | ICD-10-CM

## 2023-09-16 DIAGNOSIS — M79671 Pain in right foot: Secondary | ICD-10-CM

## 2023-09-16 DIAGNOSIS — M79672 Pain in left foot: Secondary | ICD-10-CM | POA: Diagnosis not present

## 2023-09-16 NOTE — Progress Notes (Signed)
 Patient presents for evaluation and treatment of tenderness and some redness around nails feet.  Tenderness around toes with walking and wearing shoes.  Physical exam:  General appearance: Alert, new to believe that the pleasant, and in no acute distress.  Vascular: Pedal pulses: DP 2/4 B/L, PT 1/4 B/L.  Moderate edema lower legs bilaterally.  Capillary refill time immediate bilaterally  Neurological:  Light touch intact bilaterally.  Normal Achilles tendon reflex bilaterally.  Dermatologic:  Nails thickened, disfigured, discolored 1-5 BL with subungual debris.  Redness and hypertrophic nail folds along nail folds bilaterally but no signs of drainage or infection.  Skin somewhat thin and atrophic with no hair growth lower extremity bilaterally..  Areas of hyperpigmentation extremities.  Musculoskeletal:  Toes 2 through 5 bilaterally normal muscle strength lower extremity   Diagnosis: 1. Painful onychomycotic nails 1 through 5 bilaterally. 2. Pain toes 1 through 5 bilaterally.  Plan: Debrided onychomycotic nails 1 through 5 bilaterally.  Return 3 months RFC

## 2023-09-19 ENCOUNTER — Other Ambulatory Visit (HOSPITAL_COMMUNITY): Payer: Self-pay | Admitting: Physician Assistant

## 2023-09-19 DIAGNOSIS — F332 Major depressive disorder, recurrent severe without psychotic features: Secondary | ICD-10-CM

## 2023-09-19 DIAGNOSIS — F4312 Post-traumatic stress disorder, chronic: Secondary | ICD-10-CM

## 2023-09-19 DIAGNOSIS — F29 Unspecified psychosis not due to a substance or known physiological condition: Secondary | ICD-10-CM

## 2023-09-26 ENCOUNTER — Telehealth (HOSPITAL_COMMUNITY): Payer: Self-pay

## 2023-09-26 ENCOUNTER — Ambulatory Visit (INDEPENDENT_AMBULATORY_CARE_PROVIDER_SITE_OTHER): Payer: MEDICAID | Admitting: Clinical

## 2023-09-26 DIAGNOSIS — F332 Major depressive disorder, recurrent severe without psychotic features: Secondary | ICD-10-CM | POA: Diagnosis not present

## 2023-09-26 NOTE — Telephone Encounter (Signed)
 Anhthu returns this therapist's call. Therapist confirms her identify by obtaining two verifiers.  Therapist explains that Daisy Cozart, LCSW referred her to this therapist as this therapist is dually licensed in mental health and substance use.  Therapist offers her first available therapy appointment which is on October 23, 2023 at [facility] and she accepts.    Darice Simpler, MS, LMFT, LCAS

## 2023-09-26 NOTE — Telephone Encounter (Signed)
 Daisy Cozart, LCSW referred this pt to this therapist as she is dually diagnosed.  This therapist reaches out to her via telephone. Therapist reaches Voice Mail and leaves a confidential voice mail requesting a return call.  Darice Simpler, MS, LMFT, LCAS

## 2023-09-30 ENCOUNTER — Encounter (HOSPITAL_COMMUNITY): Payer: MEDICAID | Admitting: Physician Assistant

## 2023-09-30 ENCOUNTER — Encounter (HOSPITAL_COMMUNITY): Payer: Self-pay

## 2023-10-10 ENCOUNTER — Other Ambulatory Visit (HOSPITAL_COMMUNITY): Payer: Self-pay

## 2023-10-17 ENCOUNTER — Encounter: Payer: Self-pay | Admitting: Gastroenterology

## 2023-10-20 ENCOUNTER — Other Ambulatory Visit: Payer: Self-pay | Admitting: Internal Medicine

## 2023-10-20 ENCOUNTER — Other Ambulatory Visit: Payer: Self-pay | Admitting: Family Medicine

## 2023-10-20 ENCOUNTER — Other Ambulatory Visit (HOSPITAL_COMMUNITY): Payer: Self-pay | Admitting: Physician Assistant

## 2023-10-20 DIAGNOSIS — F332 Major depressive disorder, recurrent severe without psychotic features: Secondary | ICD-10-CM

## 2023-10-20 DIAGNOSIS — F29 Unspecified psychosis not due to a substance or known physiological condition: Secondary | ICD-10-CM

## 2023-10-20 DIAGNOSIS — F4312 Post-traumatic stress disorder, chronic: Secondary | ICD-10-CM

## 2023-10-20 NOTE — Telephone Encounter (Signed)
 Patient has PCP.   Chaska Hagger, BSN, RN

## 2023-10-22 ENCOUNTER — Telehealth (INDEPENDENT_AMBULATORY_CARE_PROVIDER_SITE_OTHER): Payer: Self-pay | Admitting: Primary Care

## 2023-10-22 NOTE — Telephone Encounter (Signed)
 Copied from CRM [id number]. Topic: Clinical - Medication Question >> Oct 22, 2023  9:40 AM Daisy Salinas DASEN wrote: Reason for CRM: need refills on the list of meds from last visit- request was put in but nothing has been filled yet.

## 2023-10-22 NOTE — Telephone Encounter (Signed)
 It appears michelle is not the prescriber of the majority of her medications. Which ones is she needing a refill of?

## 2023-10-23 ENCOUNTER — Encounter (HOSPITAL_COMMUNITY): Payer: Self-pay

## 2023-10-23 ENCOUNTER — Other Ambulatory Visit: Payer: Self-pay | Admitting: Pharmacist

## 2023-10-23 ENCOUNTER — Ambulatory Visit (HOSPITAL_COMMUNITY): Payer: MEDICAID

## 2023-10-23 ENCOUNTER — Other Ambulatory Visit (HOSPITAL_COMMUNITY): Payer: Self-pay

## 2023-10-23 DIAGNOSIS — F29 Unspecified psychosis not due to a substance or known physiological condition: Secondary | ICD-10-CM

## 2023-10-23 DIAGNOSIS — F411 Generalized anxiety disorder: Secondary | ICD-10-CM

## 2023-10-23 DIAGNOSIS — F1421 Cocaine dependence, in remission: Secondary | ICD-10-CM | POA: Diagnosis not present

## 2023-10-23 DIAGNOSIS — F4312 Post-traumatic stress disorder, chronic: Secondary | ICD-10-CM

## 2023-10-23 DIAGNOSIS — F332 Major depressive disorder, recurrent severe without psychotic features: Secondary | ICD-10-CM

## 2023-10-23 MED ORDER — BICTEGRAVIR-EMTRICITAB-TENOFOV 50-200-25 MG PO TABS: 1.0000 | ORAL_TABLET | Freq: Every day | ORAL | Status: AC

## 2023-10-23 NOTE — Progress Notes (Signed)
 THERAPIST PROGRESS NOTE  Session Time: 9:00am to 9:58 am  Virtual Visit via Video Note   I connected with Jozalyn at  9:00 am EST by a video enabled telemedicine application and verified that I am speaking with the correct person using two identifiers.   Location: Patient: home Provider: [address] [place]   I discussed the limitations of evaluation and management by telemedicine and the availability of in person appointments. The patient expressed understanding and agreed to proceed.  Type of Therapy: Individual   Therapist Response/Interventions: Active Listening/CBT  Treatment Goals addressed:   Cari will improve quality of life by maintaining ongoing abstinence from all mood-altering substances as evidenced by her report or UDS if indicated (Substance Use) Disciplines:  Interdisciplinary, PROVIDER Expected end:  04/22/24 Jeena will decrease her anxiety and depression by reporting them as no higher that a 4 on the PHQ-9 and GAD 7 scales, respectively. (Substance Use) Disciplines:  Interdisciplinary, PROVIDER Expected end:  04/22/24 Tabytha will address and reduce distress from psychosis by reality testing in the sessions she reports psychotic symptoms (Substance Use) Disciplines:  Interdisciplinary, PROVIDER Expected end:  04/22/24  Summary: Stephens presents for her first virtual session.   She had a CCA on on 04-30-23 that recommended inpt treatment which she received.  Paige Cozart, LCSW had spoken with this therapist to refer her for dual diagnosis therapy.    Therapist inquires if Soriya has been in therapy previously. She reports she was in treatment at [facility] and there was a lady that said she would find her housing, but this never occurred. Laurajean says she had issues with her previous therapist because she believes the therapist told her confidential information to her husband.  She then says she may have been mixed up and just thought this occurred.  Youlanda says  she has a long history of attending 12 step meetings and she has a sponsor.   She says she has established a good support system through the meetings. She reports her last use of crack/cocaine was  on June 22, 2023 but she wants to claim her sober date to be June 28, 2023 as this is the birthday of her deceased father. Shalia carries the following diagnosis in her chart: Chronic PTSD, Major Depressive Disorder, Recurrent , Severe, unspecified psychosis, Generalized Anxiety Disorder  Jessalyn reports her current situation is stressful. She reports she sometimes has to sleep in her car. She was staying with a daughter, however she decided she could not be in an environment where people used.  She is currently working for some of her family providing assistance to her nephew who had his legs amputated.  She says her nephew gave up and ceased his self care regarding his medical issues and now he mostly stays in bed. She says he refuses to transport to the bathroom and she has to take care of cleaning and disposing the waste.  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 She has previously worked at [employer] prior to moving to [place] for a short while so she could get sober.  She is hoping to save enough money to obtain housing. She notes it is not good for her to be at the house with her nephew as it is the house where she formerly used.  She says she is desperate for money and that is why she is going this care taking job.   Jersie reports she finds life more difficult  since her husband passed in 2021. She says he always provided housing for them.  Progress Towards Goals: initial  Suicidal/Homicidal: denies  Plan: Return again on 11-04-23 at 3 pm (virtually)   Diagnosis: Cocaine Use Disorder, in early remission, Chronic PTSD Major Depressive Disorder, recurrent severe, unspecified psychosis, Generalized Anxiety Disorder    Collaboration of Care: n/a  Patient/Guardian was advised  Release of Information must be obtained prior to any record release in order to collaborate their care with an outside provider. Patient/Guardian was advised if they have not already done so to contact the registration department to sign all necessary forms in order for us  to release information regarding their care.   Consent: Patient/Guardian gives verbal consent for treatment and assignment of benefits for services provided during this visit. Patient/Guardian expressed understanding and agreed to proceed.   Darice Simpler, MS, LMFT, LCAS

## 2023-10-23 NOTE — Progress Notes (Signed)
 Medication Samples have been provided to the patient.  Drug name: Biktarvy         Strength: 50/200/25 mg       Qty: 14 tablets (2 bottles) LOT: [id number]   Exp.Date: 7/27  Samples requested by Arland Hutchinson.  Dosing instructions: Take one tablet by mouth once daily  The patient has been instructed regarding the correct time, dose, and frequency of taking this medication, including desired effects and most common side effects.   Alan Geralds, PharmD, CPP, BCIDP, AAHIVP Clinical Pharmacist Practitioner Infectious Diseases Clinical Pharmacist [facility]

## 2023-10-27 NOTE — Telephone Encounter (Signed)
 Attempt made to call patient.  Left message on voicemail to return call.

## 2023-10-28 ENCOUNTER — Other Ambulatory Visit: Payer: Self-pay | Admitting: Nurse Practitioner

## 2023-10-28 DIAGNOSIS — K219 Gastro-esophageal reflux disease without esophagitis: Secondary | ICD-10-CM

## 2023-10-28 MED ORDER — FAMOTIDINE 40 MG PO TABS: 40.0000 mg | ORAL_TABLET | Freq: Every day | ORAL | 0 refills | Status: DC

## 2023-10-28 NOTE — Telephone Encounter (Signed)
 Patient is requesting medication for reflux. States she had something she used to take called Floracid. Informed patient this was not on her chart nor called in at the pharmacy.   Requesting something to help with reflux: Floracid.    She states she has several refills on chronic medications: Atorvastatin , Amlodipine , irebesartan.  Therapist and ID prescribes other medications.

## 2023-10-28 NOTE — Telephone Encounter (Signed)
Patient is aware that Rx was sent to pharmacy.

## 2023-10-28 NOTE — Telephone Encounter (Signed)
 Pepcid sent

## 2023-10-30 ENCOUNTER — Ambulatory Visit: Payer: MEDICAID | Admitting: Internal Medicine

## 2023-10-31 ENCOUNTER — Ambulatory Visit: Payer: MEDICAID | Admitting: Internal Medicine

## 2023-11-04 ENCOUNTER — Encounter (HOSPITAL_COMMUNITY): Payer: Self-pay

## 2023-11-04 ENCOUNTER — Ambulatory Visit (HOSPITAL_COMMUNITY): Payer: MEDICAID

## 2023-11-04 NOTE — Progress Notes (Deleted)
 THERAPIST PROGRESS NOTE  Session Time: 9:00am to 9:58 am  Virtual Visit via Video Note   I connected with Daisy Salinas at  9:00 am EST by a video enabled telemedicine application and verified that I am speaking with the correct person using two identifiers.   Location: Patient: home Provider: [address] [place]   I discussed the limitations of evaluation and management by telemedicine and the availability of in person appointments. The patient expressed understanding and agreed to proceed.  Type of Therapy: Individual   Therapist Response/Interventions: Active Listening/CBT  Treatment Goals addressed:   Daisy Salinas will improve quality of life by maintaining ongoing abstinence from all mood-altering substances as evidenced by her report or UDS if indicated (Substance Use) Disciplines:  Interdisciplinary, PROVIDER Expected end:  04/22/24 Daisy Salinas will decrease her anxiety and depression by reporting them as no higher that a 4 on the PHQ-9 and GAD 7 scales, respectively. (Substance Use) Disciplines:  Interdisciplinary, PROVIDER Expected end:  04/22/24 Daisy Salinas will address and reduce distress from psychosis by reality testing in the sessions she reports psychotic symptoms (Substance Use) Disciplines:  Interdisciplinary, PROVIDER Expected end:  04/22/24  Summary: Daisy Salinas presents for her second virtual session.  Progress Towards Goals: initial  Suicidal/Homicidal: denies  Plan: Return again on 11-04-23 at 3 pm (virtually)   Diagnosis: Cocaine Use Disorder, in early remission, Chronic PTSD Major Depressive Disorder, recurrent severe, unspecified psychosis, Generalized Anxiety Disorder    Collaboration of Care: n/a  Patient/Guardian was advised Release of Information must be obtained prior to any record release in order to collaborate their care with an outside provider. Patient/Guardian was advised if they have not already done so to contact the registration department to sign all  necessary forms in order for us  to release information regarding their care.   Consent: Patient/Guardian gives verbal consent for treatment and assignment of benefits for services provided during this visit. Patient/Guardian expressed understanding and agreed to proceed.   Daisy Simpler, MS, LMFT, LCAS
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
## 2023-11-26 ENCOUNTER — Ambulatory Visit: Payer: MEDICAID | Admitting: Internal Medicine

## 2023-11-27 ENCOUNTER — Other Ambulatory Visit (HOSPITAL_COMMUNITY): Payer: Self-pay | Admitting: Physician Assistant

## 2023-11-27 DIAGNOSIS — F332 Major depressive disorder, recurrent severe without psychotic features: Secondary | ICD-10-CM

## 2023-11-27 DIAGNOSIS — F4312 Post-traumatic stress disorder, chronic: Secondary | ICD-10-CM

## 2023-11-27 DIAGNOSIS — F29 Unspecified psychosis not due to a substance or known physiological condition: Secondary | ICD-10-CM

## 2023-11-27 DIAGNOSIS — F411 Generalized anxiety disorder: Secondary | ICD-10-CM

## 2023-11-28 NOTE — Telephone Encounter (Signed)
 Message acknowledged and reviewed.

## 2023-12-02 ENCOUNTER — Ambulatory Visit: Payer: MEDICAID | Admitting: Internal Medicine

## 2023-12-05 ENCOUNTER — Ambulatory Visit (INDEPENDENT_AMBULATORY_CARE_PROVIDER_SITE_OTHER): Payer: MEDICAID | Admitting: Physician Assistant

## 2023-12-05 ENCOUNTER — Encounter (HOSPITAL_COMMUNITY): Payer: Self-pay | Admitting: Physician Assistant

## 2023-12-05 DIAGNOSIS — F29 Unspecified psychosis not due to a substance or known physiological condition: Secondary | ICD-10-CM

## 2023-12-05 DIAGNOSIS — F4312 Post-traumatic stress disorder, chronic: Secondary | ICD-10-CM | POA: Diagnosis not present

## 2023-12-05 DIAGNOSIS — F411 Generalized anxiety disorder: Secondary | ICD-10-CM

## 2023-12-05 DIAGNOSIS — F333 Major depressive disorder, recurrent, severe with psychotic symptoms: Secondary | ICD-10-CM

## 2023-12-05 MED ORDER — BUSPIRONE HCL 7.5 MG PO TABS: 7.5000 mg | ORAL_TABLET | Freq: Two times a day (BID) | ORAL | 1 refills | Status: AC

## 2023-12-05 MED ORDER — DULOXETINE HCL 30 MG PO CPEP: 30.0000 mg | ORAL_CAPSULE | Freq: Every day | ORAL | 1 refills | Status: AC

## 2023-12-05 MED ORDER — QUETIAPINE FUMARATE 50 MG PO TABS: ORAL_TABLET | ORAL | 0 refills | Status: AC

## 2023-12-05 MED ORDER — QUETIAPINE FUMARATE 100 MG PO TABS: 100.0000 mg | ORAL_TABLET | Freq: Every day | ORAL | 1 refills | Status: AC

## 2023-12-08 ENCOUNTER — Telehealth (INDEPENDENT_AMBULATORY_CARE_PROVIDER_SITE_OTHER): Payer: Self-pay | Admitting: Primary Care

## 2023-12-08 NOTE — Telephone Encounter (Signed)
 Called pt to confirm appt. Pt will be present.

## 2023-12-09 ENCOUNTER — Encounter (INDEPENDENT_AMBULATORY_CARE_PROVIDER_SITE_OTHER): Payer: Self-pay | Admitting: Primary Care

## 2023-12-09 ENCOUNTER — Ambulatory Visit (INDEPENDENT_AMBULATORY_CARE_PROVIDER_SITE_OTHER): Payer: MEDICAID | Admitting: Primary Care

## 2023-12-09 VITALS — BP 129/85 | HR 64 | Resp 16 | Wt 155.4 lb

## 2023-12-09 DIAGNOSIS — F17200 Nicotine dependence, unspecified, uncomplicated: Secondary | ICD-10-CM | POA: Diagnosis not present

## 2023-12-09 DIAGNOSIS — R7309 Other abnormal glucose: Secondary | ICD-10-CM | POA: Diagnosis not present

## 2023-12-09 DIAGNOSIS — E782 Mixed hyperlipidemia: Secondary | ICD-10-CM

## 2023-12-09 DIAGNOSIS — I10 Essential (primary) hypertension: Secondary | ICD-10-CM | POA: Diagnosis not present

## 2023-12-09 DIAGNOSIS — Z760 Encounter for issue of repeat prescription: Secondary | ICD-10-CM | POA: Diagnosis not present

## 2023-12-09 NOTE — Progress Notes (Signed)
 [facility]   Daisy Salinas is a 63 y.o. female presents for hypertension evaluation, Denies shortness of breath, headaches, chest pain or lower extremity edema, sudden onset, vision changes, unilateral weakness, dizziness, paresthesias   Patient reports adherence with medications.  Past Medical History:  Diagnosis Date   Asthma    Bipolar 1 disorder (HCC) 2009   Depression    GERD (gastroesophageal reflux disease)    HIV (human immunodeficiency virus infection) (HCC)    Hypertension    TB (pulmonary tuberculosis) 1989   was exposed and treated   UTI (urinary tract infection) 1997   Past Surgical History:  Procedure Laterality Date   ABDOMINAL HYSTERECTOMY     No Known Allergies Current Outpatient Medications on File Prior to Visit  Medication Sig Dispense Refill   ALEVE  220 MG tablet Take 220-440 mg by mouth 2 (two) times daily as needed (for headaches).     amLODipine  (NORVASC ) 10 MG tablet TAKE 1 TABLET(10 MG) BY MOUTH DAILY 90 tablet 1   atorvastatin  (LIPITOR) 40 MG tablet Take 1 tablet (40 mg total) by mouth at bedtime. 30 tablet 8   bictegravir-emtricitabine -tenofovir  AF (BIKTARVY ) 50-200-25 MG TABS tablet Take 1 tablet by mouth daily. 30 tablet 8   busPIRone  (BUSPAR ) 7.5 MG tablet Take 1 tablet (7.5 mg total) by mouth 2 (two) times daily. 60 tablet 1   DULoxetine  (CYMBALTA ) 30 MG capsule Take 1 capsule (30 mg total) by mouth daily. 30 capsule 1   famotidine  (PEPCID ) 40 MG tablet Take 1 tablet (40 mg total) by mouth daily. 90 tablet 0   hydrOXYzine  (ATARAX ) 50 MG tablet Take 1 tablet (50 mg total) by mouth every 6 (six) hours as needed for anxiety. 120 tablet 1   irbesartan  (AVAPRO ) 300 MG tablet Take 1 tablet (300 mg total) by mouth daily. 30 tablet 3   meloxicam  (MOBIC ) 15 MG tablet One tab PO qAM with breakfast for 2 weeks, then daily prn pain. 30 tablet 0   Multiple Vitamin (MULTIVITAMIN WITH MINERALS) TABS tablet Take 1 tablet by mouth daily. 30  tablet 0   QUEtiapine  (SEROQUEL ) 100 MG tablet Take 1 tablet (100 mg total) by mouth at bedtime. 30 tablet 1   QUEtiapine  (SEROQUEL ) 50 MG tablet Patient to take Seroquel  50 mg at bedtime for 4 days before titrating to 100 mg at bedtime. 4 tablet 0   thiamine  (VITAMIN B-1) 100 MG tablet Take 1 tablet (100 mg total) by mouth daily. 30 tablet 0   No current facility-administered medications on file prior to visit.   Social History   Socioeconomic History   Marital status: Widowed    Spouse name: Not on file   Number of children: Not on file   Years of education: Not on file   Highest education level: Not on file  Occupational History   Occupation: unemployed  Tobacco Use   Smoking status: Every Day    Current packs/day: 0.70    Average packs/day: 0.7 packs/day for 45.0 years (31.5 ttl pk-yrs)    Types: Cigarettes   Smokeless tobacco: Never   Tobacco comments:    wanting some cinnamon to help her stop  Vaping Use   Vaping status: Never Used  Substance and Sexual Activity   Alcohol use: Not Currently    Alcohol/week: 2.0 standard drinks of alcohol    Types: 1 Cans of beer, 1 Standard drinks or equivalent per week    Comment: occ   Drug use: Not Currently  Frequency: 1.0 times per week    Types: Cocaine    Comment: Occassional use   Sexual activity: Not Currently    Partners: Male  Other Topics Concern   Not on file  Social History Narrative   Not on file   Social Drivers of Health   Financial Resource Strain: Not on file  Food Insecurity: Patient Declined (04/30/2023)   Hunger Vital Sign    Worried About Running Out of Food in the Last Year: Patient declined    Ran Out of Food in the Last Year: Patient declined  Transportation Needs: No Transportation Needs (04/30/2023)   PRAPARE - Administrator, Civil Service (Medical): No    Lack of Transportation (Non-Medical): No  Physical Activity: Not on file  Stress: Not on file  Social Connections: Moderately  Isolated (04/30/2023)   Social Connection and Isolation Panel    Frequency of Communication with Friends and Family: More than three times a week    Frequency of Social Gatherings with Friends and Family: More than three times a week    Attends Religious Services: 1 to 4 times per year    Active Member of [employer] or Organizations: No    Attends Banker Meetings: Never    Marital Status: Widowed  Intimate Partner Violence: Not At Risk (04/30/2023)   Humiliation, Afraid, Rape, and Kick questionnaire    Fear of Current or Ex-Partner: No    Emotionally Abused: No    Physically Abused: No    Sexually Abused: No   Family History  Problem Relation Age of Onset   Coronary artery disease Mother    Leukemia Father    Health Maintenance  Topic Date Due   Zoster Vaccines- Shingrix (1 of 2) Never done   Pneumococcal Vaccine: 50+ Years (3 of 3 - PCV) 12/01/2010   Colonoscopy  06/23/2018   Lung Cancer Screening  02/23/2022   COVID-19 Vaccine (4 - 2025-26 season) 09/22/2023   Influenza Vaccine  04/20/2024 (Originally 08/22/2023)   Cervical Cancer Screening (HPV/Pap Cotest)  08/27/2024 (Originally 03/25/2011)   Mammogram  03/23/2025   DTaP/Tdap/Td (2 - Td or Tdap) 10/08/2028   Hepatitis B Vaccines 19-59 Average Risk  Completed   Hepatitis C Screening  Completed   HIV Screening  Completed   HPV VACCINES  Aged Out   Meningococcal B Vaccine  Aged Out     OBJECTIVE:  Vitals:   12/09/23 1436  BP: 129/85  Pulse: 64  Resp: 16  SpO2: 100%  Weight: 155 lb 6.4 oz (70.5 kg)    Physical Exam Vitals reviewed.  Constitutional:      Appearance: Normal appearance. She is obese.  HENT:     Head: Normocephalic.     Right Ear: Tympanic membrane, ear canal and external ear normal.     Left Ear: Tympanic membrane, ear canal and external ear normal.     Nose: Nose normal.     Mouth/Throat:     Mouth: Mucous membranes are moist.  Eyes:     Extraocular Movements: Extraocular movements intact.      Pupils: Pupils are equal, round, and reactive to light.  Cardiovascular:     Rate and Rhythm: Normal rate.  Pulmonary:     Effort: Pulmonary effort is normal.     Breath sounds: Normal breath sounds.  Abdominal:     General: Bowel sounds are normal.     Palpations: Abdomen is soft.  Musculoskeletal:  General: Normal range of motion.     Cervical back: Normal range of motion.  Skin:    General: Skin is warm and dry.  Neurological:     Mental Status: She is alert and oriented to person, place, and time.  Psychiatric:        Mood and Affect: Mood normal.        Behavior: Behavior normal.        Thought Content: Thought content normal.      ROS  Last 3 Office BP readings: BP Readings from Last 3 Encounters:  12/09/23 129/85  08/28/23 127/82  07/31/23 129/72    BMET    Component Value Date/Time   NA 136 07/31/2023 0949   K 3.5 07/31/2023 0949   CL 104 07/31/2023 0949   CO2 22 07/31/2023 0949   GLUCOSE 149 (H) 07/31/2023 0949   BUN 9 07/31/2023 0949   CREATININE 0.69 07/31/2023 0949   CALCIUM  9.2 07/31/2023 0949   GFRNONAA >60 04/30/2023 0419   GFRNONAA >89 05/16/2014 1401   GFRAA >60 01/06/2018 0436   GFRAA >89 05/16/2014 1401    Renal function: CrCl cannot be calculated (Patient's most recent lab result is older than the maximum 21 days allowed.).  Clinical ASCVD: Yes  The 10-year ASCVD risk score (Arnett DK, et al., 2019) is: 16.8%   Values used to calculate the score:     Age: 89 years     Clincally relevant sex: Female     Is Non-Hispanic African American: Yes     Diabetic: No     Tobacco smoker: Yes     Systolic Blood Pressure: 129 mmHg     Is BP treated: Yes     HDL Cholesterol: 42 mg/dL     Total Cholesterol: 183 mg/dL  ASCVD risk factors include- CHAD   ASSESSMENT & PLAN Destyni was seen today for hypertension.  Diagnoses and all orders for this visit:  CIGARETTE SMOKER - I have recommended complete cessation of tobacco use. I  have discussed various options available for assistance with tobacco cessation including over the counter methods (Nicotine  gum, patch and lozenges). We also discussed prescription options (Chantix, Nicotine  Inhaler / Nasal Spray). The patient is not interested in pursuing any prescription tobacco cessation options at this time. - Patient declines at this time.  - Less than 5 minutes spent on counseling.  -     Ambulatory Referral for Lung Cancer Screening [REF832]   Elevated glucose -     CBC with Differential/Platelet; Future -     Hemoglobin A1c  Essential hypertension BP goal - < 140/90   DIET: Limit salt intake, read nutrition labels to check salt content, limit fried and high fatty foods  Avoid using multisymptom OTC cold preparations that generally contain sudafed which can rise BP. Consult with pharmacist on best cold relief products to use for persons with HTN EXERCISE Discussed incorporating exercise such as walking - 30 minutes most days of the week and can do in 10 minute intervals    -     CMP14+EGFR; Future  Medication refill -     irbesartan  (AVAPRO ) 300 MG tablet; Take 1 tablet (300 mg total) by mouth daily. -     atorvastatin  (LIPITOR) 40 MG tablet; Take 1 tablet (40 mg total) by mouth at bedtime.  Mixed hyperlipidemia -     Lipid Panel; Future     This note has been created with Education officer, environmental. Any  transcriptional errors are unintentional.   Rosaline SHAUNNA Bohr, NP 12/09/2023, 2:44 PM

## 2023-12-10 ENCOUNTER — Telehealth: Payer: Self-pay

## 2023-12-10 ENCOUNTER — Ambulatory Visit: Payer: MEDICAID | Admitting: Gastroenterology

## 2023-12-10 NOTE — Progress Notes (Deleted)
 Daisy Salinas [id number] 06-25-1960   Chief Complaint:  Referring Provider: Celestia Rosaline SQUIBB, NP Primary GI MD: Sampson (previous Dr. Debrah)  HPI: Daisy Salinas is a 63 y.o. female with past medical history of asthma, bipolar 1 disorder, depression, GERD, HIV, HTN, TB s/p treatment, hysterectomy who presents today for a complaint of constipation and rectal bleeding, and to discuss colonoscopy.    Referred by PCP for colon cancer screening.  Discussed the use of AI scribe software for clinical note transcription with the patient, who gave verbal consent to proceed.  History of Present Illness       Previous GI Procedures/Imaging   Colonoscopy 06/22/2008 Internal hemorrhoids Otherwise normal exam Limited rectal bleeding secondary to hemorrhoids 10-year recall  Past Medical History:  Diagnosis Date   Asthma    Bipolar 1 disorder (HCC) 2009   Depression    GERD (gastroesophageal reflux disease)    HIV (human immunodeficiency virus infection) (HCC)    Hypertension    TB (pulmonary tuberculosis) 1989   was exposed and treated   UTI (urinary tract infection) 1997    Past Surgical History:  Procedure Laterality Date   ABDOMINAL HYSTERECTOMY      Current Outpatient Medications  Medication Sig Dispense Refill   ALEVE  220 MG tablet Take 220-440 mg by mouth 2 (two) times daily as needed (for headaches).     amLODipine  (NORVASC ) 10 MG tablet TAKE 1 TABLET(10 MG) BY MOUTH DAILY 90 tablet 1   atorvastatin  (LIPITOR) 40 MG tablet Take 1 tablet (40 mg total) by mouth at bedtime. 30 tablet 8   bictegravir-emtricitabine -tenofovir  AF (BIKTARVY ) 50-200-25 MG TABS tablet Take 1 tablet by mouth daily. 30 tablet 8   busPIRone (BUSPAR) 7.5 MG tablet Take 1 tablet (7.5 mg total) by mouth 2 (two) times daily. 60 tablet 1   DULoxetine  (CYMBALTA ) 30 MG capsule Take 1 capsule (30 mg total) by mouth daily. 30 capsule 1   famotidine  (PEPCID ) 40 MG tablet Take 1 tablet (40 mg  total) by mouth daily. 90 tablet 0   hydrOXYzine  (ATARAX ) 50 MG tablet Take 1 tablet (50 mg total) by mouth every 6 (six) hours as needed for anxiety. 120 tablet 1   irbesartan  (AVAPRO ) 300 MG tablet Take 1 tablet (300 mg total) by mouth daily. 30 tablet 3   meloxicam  (MOBIC ) 15 MG tablet One tab PO qAM with breakfast for 2 weeks, then daily prn pain. 30 tablet 0   Multiple Vitamin (MULTIVITAMIN WITH MINERALS) TABS tablet Take 1 tablet by mouth daily. 30 tablet 0   QUEtiapine  (SEROQUEL ) 100 MG tablet Take 1 tablet (100 mg total) by mouth at bedtime. 30 tablet 1   QUEtiapine  (SEROQUEL ) 50 MG tablet Patient to take Seroquel  50 mg at bedtime for 4 days before titrating to 100 mg at bedtime. 4 tablet 0   thiamine  (VITAMIN B-1) 100 MG tablet Take 1 tablet (100 mg total) by mouth daily. 30 tablet 0   No current facility-administered medications for this visit.    Allergies as of 12/10/2023   (No Known Allergies)    Family History  Problem Relation Age of Onset   Coronary artery disease Mother    Leukemia Father     Social History   Tobacco Use   Smoking status: Every Day    Current packs/day: 0.70    Average packs/day: 0.7 packs/day for 45.0 years (31.5 ttl pk-yrs)    Types: Cigarettes   Smokeless tobacco: Never   Tobacco comments:  wanting some cinnamon to help her stop  Vaping Use   Vaping status: Never Used  Substance Use Topics   Alcohol use: Not Currently    Alcohol/week: 2.0 standard drinks of alcohol    Types: 1 Cans of beer, 1 Standard drinks or equivalent per week    Comment: occ   Drug use: Not Currently    Frequency: 1.0 times per week    Types: Cocaine    Comment: Occassional use     Review of Systems:    Constitutional: No weight loss, fever, chills, weakness or fatigue Eyes: No change in vision Ears, Nose, Throat:  No change in hearing or congestion Skin: No rash or itching Cardiovascular: No chest pain, chest pressure or palpitations   Respiratory: No  SOB or cough Gastrointestinal: See HPI and otherwise negative Genitourinary: No dysuria or change in urinary frequency Neurological: No headache, dizziness or syncope Musculoskeletal: No new muscle or joint pain Hematologic: No bleeding or bruising    Physical Exam:  Vital signs: There were no vitals taken for this visit.  Constitutional: NAD, Well developed, Well nourished, alert and cooperative Head:  Normocephalic and atraumatic.  Eyes: No scleral icterus. Conjunctiva pink. Mouth: No oral lesions. Respiratory: Respirations even and unlabored. Lungs clear to auscultation bilaterally.  No wheezes, crackles, or rhonchi.  Cardiovascular:  Regular rate and rhythm. No murmurs. No peripheral edema. Gastrointestinal:  Soft, nondistended, nontender. No rebound or guarding. Normal bowel sounds. No appreciable masses or hepatomegaly. Rectal:  Not performed.  Neurologic:  Alert and oriented x4;  grossly normal neurologically.  Skin:   Dry and intact without significant lesions or rashes. Psychiatric: Oriented to person, place and time. Demonstrates good judgement and reason without abnormal affect or behaviors.   RELEVANT LABS AND IMAGING: CBC    Component Value Date/Time   WBC 7.6 07/31/2023 0949   RBC 3.98 07/31/2023 0949   HGB 13.2 07/31/2023 0949   HCT 38.7 07/31/2023 0949   PLT 173 07/31/2023 0949   MCV 97.2 07/31/2023 0949   MCH 33.2 (H) 07/31/2023 0949   MCHC 34.1 07/31/2023 0949   RDW 13.3 07/31/2023 0949   LYMPHSABS 3.9 04/29/2023 1922   MONOABS 0.8 04/29/2023 1922   EOSABS 0.1 04/29/2023 1922   BASOSABS 0.0 04/29/2023 1922    CMP     Component Value Date/Time   NA 136 07/31/2023 0949   K 3.5 07/31/2023 0949   CL 104 07/31/2023 0949   CO2 22 07/31/2023 0949   GLUCOSE 149 (H) 07/31/2023 0949   BUN 9 07/31/2023 0949   CREATININE 0.69 07/31/2023 0949   CALCIUM  9.2 07/31/2023 0949   PROT 7.8 07/31/2023 0949   ALBUMIN 4.3 04/29/2023 1922   AST 23 07/31/2023 0949    ALT 9 07/31/2023 0949   ALKPHOS 61 04/29/2023 1922   BILITOT 0.7 07/31/2023 0949   GFRNONAA >60 04/30/2023 0419   GFRNONAA >89 05/16/2014 1401   GFRAA >60 01/06/2018 0436   GFRAA >89 05/16/2014 1401     Assessment/Plan:    Assessment and Plan Assessment & Plan        Camie Furbish, PA-C [facility] 12/10/2023, 8:05 AM  Patient Care Team: Celestia Rosaline SQUIBB, NP as PCP - General (Internal Medicine)

## 2023-12-10 NOTE — Telephone Encounter (Signed)
 Copied from CRM [id number]. Topic: Referral - Question >> Dec 10, 2023 12:58 PM Tysheama G wrote: Reason for CRM: Patient wants to know if Dr.Edwards can put a referral in for Gastroenterology. She had an appointment yesterday but was late and they ended up rescheduling for 01/28/2024 so she wanted to know if Dr.Edwards put a referral in can she get a sooner appointment. Callback number [phone]

## 2023-12-12 NOTE — Telephone Encounter (Signed)
 Please reach out to pt and provide her contact information for Gi   Thanks

## 2023-12-16 ENCOUNTER — Ambulatory Visit: Payer: MEDICAID | Admitting: Podiatry

## 2023-12-19 MED ORDER — ATORVASTATIN CALCIUM 40 MG PO TABS: 40.0000 mg | ORAL_TABLET | Freq: Every day | ORAL | 1 refills | Status: AC

## 2023-12-19 MED ORDER — IRBESARTAN 300 MG PO TABS: 300.0000 mg | ORAL_TABLET | Freq: Every day | ORAL | 1 refills | Status: AC

## 2023-12-22 ENCOUNTER — Other Ambulatory Visit: Payer: Self-pay

## 2023-12-22 ENCOUNTER — Ambulatory Visit: Payer: MEDICAID | Admitting: Internal Medicine

## 2023-12-22 ENCOUNTER — Encounter: Payer: Self-pay | Admitting: Internal Medicine

## 2023-12-22 ENCOUNTER — Other Ambulatory Visit (HOSPITAL_COMMUNITY)
Admission: RE | Admit: 2023-12-22 | Discharge: 2023-12-22 | Disposition: A | Payer: MEDICAID | Source: Ambulatory Visit | Attending: Internal Medicine | Admitting: Internal Medicine

## 2023-12-22 VITALS — BP 156/89 | HR 69 | Temp 98.3°F | Ht 59.0 in | Wt 154.0 lb

## 2023-12-22 DIAGNOSIS — Z113 Encounter for screening for infections with a predominantly sexual mode of transmission: Secondary | ICD-10-CM | POA: Insufficient documentation

## 2023-12-22 DIAGNOSIS — F32A Depression, unspecified: Secondary | ICD-10-CM

## 2023-12-22 DIAGNOSIS — Z23 Encounter for immunization: Secondary | ICD-10-CM

## 2023-12-22 DIAGNOSIS — Z1159 Encounter for screening for other viral diseases: Secondary | ICD-10-CM

## 2023-12-22 DIAGNOSIS — F17210 Nicotine dependence, cigarettes, uncomplicated: Secondary | ICD-10-CM

## 2023-12-22 DIAGNOSIS — I10 Essential (primary) hypertension: Secondary | ICD-10-CM

## 2023-12-22 DIAGNOSIS — Z91199 Patient's noncompliance with other medical treatment and regimen due to unspecified reason: Secondary | ICD-10-CM | POA: Diagnosis not present

## 2023-12-22 DIAGNOSIS — B20 Human immunodeficiency virus [HIV] disease: Secondary | ICD-10-CM | POA: Diagnosis not present

## 2023-12-22 DIAGNOSIS — J449 Chronic obstructive pulmonary disease, unspecified: Secondary | ICD-10-CM

## 2023-12-22 DIAGNOSIS — Z8619 Personal history of other infectious and parasitic diseases: Secondary | ICD-10-CM

## 2023-12-22 NOTE — Progress Notes (Signed)
 [facility]   Cc: hiv f/u  HPI: Daisy Salinas is a 63 y.o. female presents for HIV management f/u  02/2023 first rcid visit with me: #hiv Previously seen dr Elaine; last seen by Dr Dennise on 10/02/22 Dx'ed 1989s Cd4 nadir unknown but said she never took prophylaxis Hx OI - none Therapy: Started therapy late 1990s Started tx early 2000s with Atripla   --> 01-16-15 tivicay /truvada --> 01/16/15 genvoya  --> 01/15/2022 biktarvy  Doesn't appear any virologic failure  Ran out of biktarvy  for the past month  She hasn't taken other medications for 8 months   #hx hep c Treated with harvoni in 2015/01/16; cured  #hx syphilis Previous treated 1970s when she was first treated as late latent. Had it again 1990s treated Titer stable 1:1 serofast since 01/16/2016  #social Sexually active but not in any relationship Lives in [place] alone Husband passed away 01/16/2020  Still smoke 1p/2days None of her kids have hiv Part time work conservation officer, [employer] Hx substance - intranasal cocaine; distant hx iv Smokes weed every now and then  Still snorted cocaine here and there Occasional etoh  Other medical problem #depression #htn #tobacco use #copd -- not on oxygen  --------------- 07/31/23 id clinic f/u Brother dying/hospice at home Patient very depressed; suicidal thoughts but no concrete plan -- chronic depression; does know of resource to call She recently relapsed in crack cocaine/alcohol after 16 years, and is living in her car. Trying to stay clean She had thought of detox to help with housing/transition but would like to spend time with her brother Compliant with biktarvy  no missed dose Also routinely sees her pcp in [place] [facility] week   12/22/23 id clinic f/u Brother passed away in August 16, 2023; she is doing ok Still smokes 1 p every 3 days No hard drugs Going to [employer] meeting Daughter also looking for drug rehab  No missed dose biktarvy  last 4 weeks Sexually  active -- oral and vagina No health concern today   Past Medical History:  Diagnosis Date   Asthma    Bipolar 1 disorder (HCC) 01/16/2008   Depression    GERD (gastroesophageal reflux disease)    HIV (human immunodeficiency virus infection) (HCC)    Hypertension    TB (pulmonary tuberculosis) 1989   was exposed and treated   UTI (urinary tract infection) 1997    Past Surgical History:  Procedure Laterality Date   ABDOMINAL HYSTERECTOMY      Family History  Problem Relation Age of Onset   Coronary artery disease Mother    Leukemia Father    Current Outpatient Medications on File Prior to Visit  Medication Sig Dispense Refill   amLODipine  (NORVASC ) 10 MG tablet TAKE 1 TABLET(10 MG) BY MOUTH DAILY 90 tablet 1   atorvastatin  (LIPITOR) 40 MG tablet Take 1 tablet (40 mg total) by mouth at bedtime. 90 tablet 1   bictegravir-emtricitabine -tenofovir  AF (BIKTARVY ) 50-200-25 MG TABS tablet Take 1 tablet by mouth daily. 30 tablet 8   busPIRone  (BUSPAR ) 7.5 MG tablet Take 1 tablet (7.5 mg total) by mouth 2 (two) times daily. 60 tablet 1   DULoxetine  (CYMBALTA ) 30 MG capsule Take 1 capsule (30 mg total) by mouth daily. 30 capsule 1   famotidine  (PEPCID ) 40 MG tablet Take 1 tablet (40 mg total) by mouth daily. 90 tablet 0   hydrOXYzine  (ATARAX ) 50 MG tablet Take 1 tablet (50 mg total) by mouth every 6 (six) hours as needed  for anxiety. 120 tablet 1   irbesartan  (AVAPRO ) 300 MG tablet Take 1 tablet (300 mg total) by mouth daily. 90 tablet 1   meloxicam  (MOBIC ) 15 MG tablet One tab PO qAM with breakfast for 2 weeks, then daily prn pain. 30 tablet 0   Multiple Vitamin (MULTIVITAMIN WITH MINERALS) TABS tablet Take 1 tablet by mouth daily. 30 tablet 0   QUEtiapine  (SEROQUEL ) 100 MG tablet Take 1 tablet (100 mg total) by mouth at bedtime. 30 tablet 1   QUEtiapine  (SEROQUEL ) 50 MG tablet Patient to take Seroquel  50 mg at bedtime for 4 days before titrating to 100 mg at bedtime. 4 tablet 0   thiamine   (VITAMIN B-1) 100 MG tablet Take 1 tablet (100 mg total) by mouth daily. 30 tablet 0   No current facility-administered medications on file prior to visit.    No Known Allergies  Physical exam: General/constitutional: no distress, pleasant HEENT: Normocephalic, PER, Conj Clear, EOMI, Oropharynx clear Neck supple CV: rrr no mrg Lungs: clear to auscultation, normal respiratory effort Abd: Soft, Nontender Ext: no edema Skin: No Rash Neuro: nonfocal MSK: no peripheral joint swelling/tenderness/warmth; back spines nontender    Lab Results HIV 1 RNA Quant  Date Value  07/31/2023 NOT DETECTED copies/mL  03/03/2023 Not Detected Copies/mL  09/18/2022 29 Copies/mL (H)   CD4 T Cell Abs (/uL)  Date Value  04/29/2023 1,762  03/03/2023 1,340  09/18/2022 1,808 (H)   Lab Results  Component Value Date   HIV1GENOSEQ REPORT 01/12/2014   Lab Results  Component Value Date   WBC 7.6 07/31/2023   HGB 13.2 07/31/2023   HCT 38.7 07/31/2023   MCV 97.2 07/31/2023   PLT 173 07/31/2023    Lab Results  Component Value Date   CREATININE 0.69 07/31/2023   BUN 9 07/31/2023   NA 136 07/31/2023   K 3.5 07/31/2023   CL 104 07/31/2023   CO2 22 07/31/2023   Lab Results  Component Value Date   ALT 9 07/31/2023   AST 23 07/31/2023   ALKPHOS 61 04/29/2023   BILITOT 0.7 07/31/2023    Lab Results  Component Value Date   CHOL 183 03/03/2023   TRIG 126 03/03/2023   HDL 42 (L) 03/03/2023   LDLCALC 117 (H) 03/03/2023   No results found for: HAV Lab Results  Component Value Date   HEPBSAG NON-REACTIVE 10/14/2019   HEPBSAB NON-REACTIVE 10/14/2019   Lab Results  Component Value Date   HCVAB YES 03/17/2006   Lab Results  Component Value Date   CHLAMYDIAWP Negative 03/03/2023   N Negative 03/03/2023   No results found for: GCPROBEAPT No results found for: QUANTGOLD  Assessment/Plan #hiv #cad (ascvd 15.9%)/reprieve Previously seen dr Elaine; last seen by Dr Dennise on  10/02/22 Dx'ed 1989s Cd4 nadir unknown but said she never took prophylaxis Hx OI - none Therapy: Started therapy late 1990s Started tx early 2000s with Atripla   --> 2016 tivicay /truvada --> 2016 genvoya  --> 2023 biktarvy  Doesn't appear any virologic failure  02/2023 Ran out of biktarvy  for the past month  She hasn't taken other medications for 8 months  Will restart biktarvy  -- rx sent to [facility] on [address] (medicaid patient)  12/22/23 no lapse in biktarvy ; compliant Lab Results  Component Value Date   HIV1RNAQUANT NOT DETECTED 07/31/2023   Lab Results  Component Value Date   CD4TCELL 46 04/29/2023   CD4TABS 1,762 04/29/2023     -already on lipitor 40 mg daily and will continue -discussed u=u -encourage  compliance -continue current HIV medication biktarvy  -labs next time 9 months -f/u in 9 months   #depression #grief #substance abuse #homelessness Brother hospice/cancer Sister recently passed away Relapsed cocaine/etoh trying to stay clean  Thp helped her get her apartment and travel as of 07/2023 Patient without depression. Happy with her life -- in a good spot now 12/22/23   -continue to f/u psych   #hx hep c Treated with harvoni in 2016; cured Snorted cocaine so will recheck next visit 07/2023 hep c pcr negative     #hx syphilis Previous treated 1970s when she was first treated as late latent. Had it again 1990s treated Titer stable 1:1 serofast since 2017 07/2023 rpr titer 1   #htn/hlp #cad risk Does see pcp for these  -continue lipitor 40 mg -continue avapro  and amlodipine  -f/u pcp for htn/hlp and other medical problems/reengagement with meds   #hcm -vaccination Patient agreeable to prevnar 20 -hepatitis 2021 hep b cAb positive; sAg and sAb negative 07/2023 hcv rna ordered --> negative 12/22/23 hep b sAg sAb and dna ordered -std screen Hx syphilis; rpr titer serofast 1:1 last checked 02/2023 02/2023 Urine gc/chlam negative 07/2023  will check rpr/urine cytology --> rpr baseline; urine cytology not done 12/2023  last sexually active 4 weeks prior to this visit -- rpr and urine and oral swab -tb screen Quantiferon gold negative -women's health cancer screening F/u pcp         Constance ONEIDA Passer, MD [facility] [phone]  pager   [phone] cell 12/22/2023, 3:56 PM

## 2023-12-22 NOTE — Addendum Note (Signed)
 Addended by: CELESTIA LELA HERO on: 12/22/2023 04:37 PM   Modules accepted: Orders

## 2023-12-22 NOTE — Patient Instructions (Signed)
 Vaccine Prevnar 20 (pneumococcal vaccine) today   Continue biktarvy    Urine/blood test today   See me in 9 months; continue biktarvy 

## 2023-12-23 ENCOUNTER — Ambulatory Visit: Payer: MEDICAID | Admitting: Podiatry

## 2023-12-23 DIAGNOSIS — M79672 Pain in left foot: Secondary | ICD-10-CM

## 2023-12-23 DIAGNOSIS — B351 Tinea unguium: Secondary | ICD-10-CM | POA: Diagnosis not present

## 2023-12-23 DIAGNOSIS — M79671 Pain in right foot: Secondary | ICD-10-CM | POA: Diagnosis not present

## 2023-12-23 NOTE — Progress Notes (Signed)
 Patient presents for evaluation and treatment of tenderness and some redness around nails feet.  Tenderness around toes with walking and wearing shoes.  Physical exam:  General appearance: Alert, pleasant, and in no acute distress.  Vascular: Pedal pulses: DP 2/4 B/L, PT 1/4 B/L.  Moderate edema lower legs bilaterally.  Capillary refill time immediate bilaterally  Neurologic:  Dermatologic:  Nails thickened, disfigured, discolored 1-5 BL with subungual debris.  Redness and hypertrophic nail folds along nail folds bilaterally but no signs of drainage or infection.  Musculoskeletal:     Diagnosis: 1. Painful onychomycotic nails 1 through 5 bilaterally. 2. Pain toes 1 through 5 bilaterally.  Plan: -Debrided onychomycotic nails 1 through 5 bilaterally.  Sharply debrided nails with nail clipper and reduced with a power bur.  Return 3 months RFC

## 2023-12-24 LAB — URINE CYTOLOGY ANCILLARY ONLY
Chlamydia: NEGATIVE
Comment: NEGATIVE
Comment: NEGATIVE
Comment: NORMAL
Neisseria Gonorrhea: NEGATIVE
Trichomonas: NEGATIVE

## 2023-12-24 LAB — CYTOLOGY, (ORAL, ANAL, URETHRAL) ANCILLARY ONLY
Chlamydia: NEGATIVE
Comment: NEGATIVE
Comment: NORMAL
Neisseria Gonorrhea: NEGATIVE

## 2023-12-30 ENCOUNTER — Encounter: Payer: Self-pay | Admitting: Emergency Medicine

## 2023-12-30 LAB — HEPATITIS B DNA, ULTRAQUANTITATIVE, PCR
Hepatitis B DNA: NOT DETECTED [IU]/mL
Hepatitis B virus DNA: NOT DETECTED {Log_IU}/mL

## 2023-12-30 LAB — HIV-1 RNA QUANT-NO REFLEX-BLD
HIV 1 RNA Quant: <20 {copies}/mL — AB
HIV-1 RNA Quant, Log: <1.3 {Log_copies}/mL — AB

## 2023-12-30 LAB — SYPHILIS: RPR W/REFLEX TO RPR TITER AND TREPONEMAL ANTIBODIES, TRADITIONAL SCREENING AND DIAGNOSIS ALGORITHM: RPR Ser Ql: REACTIVE — AB

## 2023-12-30 LAB — HEPATITIS B SURFACE ANTIBODY, QUANTITATIVE: Hep B S AB Quant (Post): <5 m[IU]/mL — ABNORMAL LOW (ref >=10)

## 2023-12-30 LAB — RPR TITER: RPR Titer: 1:1 {titer} — ABNORMAL HIGH

## 2023-12-30 LAB — T PALLIDUM AB: T Pallidum Abs: POSITIVE — AB

## 2023-12-30 LAB — HEPATITIS B SURFACE ANTIGEN: Hepatitis B Surface Ag: NONREACTIVE

## 2024-01-02 ENCOUNTER — Ambulatory Visit: Payer: Self-pay | Admitting: Internal Medicine

## 2024-01-09 ENCOUNTER — Encounter (HOSPITAL_COMMUNITY): Payer: Self-pay | Admitting: Physician Assistant

## 2024-01-09 ENCOUNTER — Ambulatory Visit (HOSPITAL_COMMUNITY): Payer: MEDICAID | Admitting: Physician Assistant

## 2024-01-09 DIAGNOSIS — F411 Generalized anxiety disorder: Secondary | ICD-10-CM

## 2024-01-09 DIAGNOSIS — F4312 Post-traumatic stress disorder, chronic: Secondary | ICD-10-CM | POA: Diagnosis not present

## 2024-01-09 DIAGNOSIS — F29 Unspecified psychosis not due to a substance or known physiological condition: Secondary | ICD-10-CM | POA: Diagnosis not present

## 2024-01-09 DIAGNOSIS — F333 Major depressive disorder, recurrent, severe with psychotic symptoms: Secondary | ICD-10-CM

## 2024-01-09 MED ORDER — QUETIAPINE FUMARATE 100 MG PO TABS: 100.0000 mg | ORAL_TABLET | Freq: Every day | ORAL | 1 refills | Status: DC

## 2024-01-09 MED ORDER — DULOXETINE HCL 30 MG PO CPEP: 30.0000 mg | ORAL_CAPSULE | Freq: Every day | ORAL | 1 refills | Status: DC

## 2024-01-09 MED ORDER — BUSPIRONE HCL 10 MG PO TABS: 10.0000 mg | ORAL_TABLET | Freq: Two times a day (BID) | ORAL | 1 refills | Status: DC
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
## 2024-01-11 ENCOUNTER — Encounter (HOSPITAL_COMMUNITY): Payer: Self-pay | Admitting: Physician Assistant

## 2024-01-28 ENCOUNTER — Ambulatory Visit: Payer: MEDICAID | Admitting: Gastroenterology

## 2024-01-28 ENCOUNTER — Other Ambulatory Visit: Payer: Self-pay | Admitting: Nurse Practitioner

## 2024-01-28 DIAGNOSIS — K219 Gastro-esophageal reflux disease without esophagitis: Secondary | ICD-10-CM

## 2024-01-28 NOTE — Progress Notes (Deleted)
 "  Daisy Salinas [id number] 1960-07-27   Chief Complaint:  Referring Provider: Celestia Rosaline SQUIBB, NP Primary GI MD: Sampson (previous Dr. Debrah)  HPI: [name] is a 64 y.o. female with past medical history of asthma, bipolar 1 disorder, depression, GERD, HIV, HTN, TB, hysterectomy who presents today for a complaint of *** .    Patient is referred by PCP for colon cancer screening.   Discussed the use of AI scribe software for clinical note transcription with the patient, who gave verbal consent to proceed.  History of Present Illness       Previous GI Procedures/Imaging   Colonoscopy 06/22/2008 Internal hemorrhoids Otherwise normal exam Limited rectal bleeding secondary to hemorrhoids  Past Medical History:  Diagnosis Date   Asthma    Bipolar 1 disorder (HCC) 2009   Depression    GERD (gastroesophageal reflux disease)    HIV (human immunodeficiency virus infection) (HCC)    Hypertension    TB (pulmonary tuberculosis) 1989   was exposed and treated   UTI (urinary tract infection) 1997    Past Surgical History:  Procedure Laterality Date   ABDOMINAL HYSTERECTOMY      Current Outpatient Medications  Medication Sig Dispense Refill   amLODipine  (NORVASC ) 10 MG tablet TAKE 1 TABLET(10 MG) BY MOUTH DAILY 90 tablet 1   atorvastatin  (LIPITOR) 40 MG tablet Take 1 tablet (40 mg total) by mouth at bedtime. 90 tablet 1   bictegravir-emtricitabine -tenofovir  AF (BIKTARVY ) 50-200-25 MG TABS tablet Take 1 tablet by mouth daily. 30 tablet 8   busPIRone  (BUSPAR ) 10 MG tablet Take 1 tablet (10 mg total) by mouth 2 (two) times daily. 60 tablet 1   DULoxetine  (CYMBALTA ) 30 MG capsule Take 1 capsule (30 mg total) by mouth daily. 30 capsule 1   famotidine  (PEPCID ) 40 MG tablet Take 1 tablet (40 mg total) by mouth daily. 90 tablet 0   hydrOXYzine  (ATARAX ) 50 MG tablet Take 1 tablet (50 mg total) by mouth every 6 (six) hours as needed for anxiety. 120 tablet 1    irbesartan  (AVAPRO ) 300 MG tablet Take 1 tablet (300 mg total) by mouth daily. 90 tablet 1   meloxicam  (MOBIC ) 15 MG tablet One tab PO qAM with breakfast for 2 weeks, then daily prn pain. 30 tablet 0   Multiple Vitamin (MULTIVITAMIN WITH MINERALS) TABS tablet Take 1 tablet by mouth daily. 30 tablet 0   QUEtiapine  (SEROQUEL ) 100 MG tablet Take 1 tablet (100 mg total) by mouth at bedtime. 30 tablet 1   thiamine  (VITAMIN B-1) 100 MG tablet Take 1 tablet (100 mg total) by mouth daily. 30 tablet 0   No current facility-administered medications for this visit.    Allergies as of 01/28/2024   (No Known Allergies)    Family History  Problem Relation Age of Onset   Coronary artery disease Mother    Leukemia Father     Social History[1]   Review of Systems:    Constitutional: No weight loss, fever, chills, weakness or fatigue Eyes: No change in vision Ears, Nose, Throat:  No change in hearing or congestion Skin: No rash or itching Cardiovascular: No chest pain, chest pressure or palpitations   Respiratory: No SOB or cough Gastrointestinal: See HPI and otherwise negative Genitourinary: No dysuria or change in urinary frequency Neurological: No headache, dizziness or syncope Musculoskeletal: No new muscle or joint pain Hematologic: No bleeding or bruising    Physical Exam:  Vital signs: There were no vitals taken for this visit.  Wt Readings from Last 3 Encounters:  12/22/23 154 lb (69.9 kg)  12/09/23 155 lb 6.4 oz (70.5 kg)  09/16/23 146 lb (66.2 kg)     Constitutional: NAD, Well developed, Well nourished, alert and cooperative Head:  Normocephalic and atraumatic.  Eyes: No scleral icterus. Conjunctiva pink. Mouth: No oral lesions. Respiratory: Respirations even and unlabored. Lungs clear to auscultation bilaterally.  No wheezes, crackles, or rhonchi.  Cardiovascular:  Regular rate and rhythm. No murmurs. No peripheral edema. Gastrointestinal:  Soft, nondistended, nontender.  No rebound or guarding. Normal bowel sounds. No appreciable masses or hepatomegaly. Rectal:  Not performed.  Neurologic:  Alert and oriented x4;  grossly normal neurologically.  Skin:   Dry and intact without significant lesions or rashes. Psychiatric: Oriented to person, place and time. Demonstrates good judgement and reason without abnormal affect or behaviors.      Assessment/Plan:   Assessment & Plan        Camie Furbish, PA-C [facility] 01/28/2024, 8:12 AM  Patient Care Team: Celestia Rosaline SQUIBB, NP as PCP - General (Internal Medicine)      [1]  Social History Tobacco Use   Smoking status: Every Day    Current packs/day: 0.70    Average packs/day: 0.7 packs/day for 45.0 years (31.5 ttl pk-yrs)    Types: Cigarettes   Smokeless tobacco: Never   Tobacco comments:    wanting some cinnamon to help her stop  Vaping Use   Vaping status: Never Used  Substance Use Topics   Alcohol use: Not Currently    Alcohol/week: 2.0 standard drinks of alcohol    Types: 1 Cans of beer, 1 Standard drinks or equivalent per week    Comment: occ   Drug use: Not Currently    Frequency: 1.0 times per week    Types: Cocaine    Comment: Occassional use   "

## 2024-02-15 ENCOUNTER — Other Ambulatory Visit (HOSPITAL_COMMUNITY): Payer: Self-pay | Admitting: Physician Assistant

## 2024-02-15 DIAGNOSIS — F411 Generalized anxiety disorder: Secondary | ICD-10-CM

## 2024-02-15 DIAGNOSIS — F333 Major depressive disorder, recurrent, severe with psychotic symptoms: Secondary | ICD-10-CM

## 2024-02-18 ENCOUNTER — Encounter (HOSPITAL_COMMUNITY): Payer: Self-pay | Admitting: Physician Assistant

## 2024-02-18 ENCOUNTER — Encounter (HOSPITAL_COMMUNITY): Payer: MEDICAID | Admitting: Physician Assistant

## 2024-02-18 ENCOUNTER — Telehealth (HOSPITAL_COMMUNITY): Payer: MEDICAID | Admitting: Physician Assistant

## 2024-02-18 DIAGNOSIS — F4312 Post-traumatic stress disorder, chronic: Secondary | ICD-10-CM | POA: Diagnosis not present

## 2024-02-18 DIAGNOSIS — F333 Major depressive disorder, recurrent, severe with psychotic symptoms: Secondary | ICD-10-CM | POA: Diagnosis not present

## 2024-02-18 DIAGNOSIS — F29 Unspecified psychosis not due to a substance or known physiological condition: Secondary | ICD-10-CM

## 2024-02-18 DIAGNOSIS — F411 Generalized anxiety disorder: Secondary | ICD-10-CM | POA: Diagnosis not present

## 2024-02-18 MED ORDER — DULOXETINE HCL 30 MG PO CPEP: 30.0000 mg | ORAL_CAPSULE | Freq: Every day | ORAL | 1 refills | Status: AC

## 2024-02-18 MED ORDER — QUETIAPINE FUMARATE 100 MG PO TABS: 100.0000 mg | ORAL_TABLET | Freq: Every day | ORAL | 1 refills | Status: AC

## 2024-02-18 MED ORDER — BUSPIRONE HCL 10 MG PO TABS: 10.0000 mg | ORAL_TABLET | Freq: Two times a day (BID) | ORAL | 1 refills | Status: AC
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
## 2024-03-23 ENCOUNTER — Ambulatory Visit: Payer: MEDICAID | Admitting: Podiatry

## 2024-09-21 ENCOUNTER — Ambulatory Visit: Payer: MEDICAID | Admitting: Internal Medicine
